# Patient Record
Sex: Female | Born: 1970 | Race: Black or African American | Hispanic: No | Smoking: Never smoker
Health system: Southern US, Community
[De-identification: ages and names within clinical notes are randomized; demographics above are authoritative.]

## PROBLEM LIST (undated history)

## (undated) DIAGNOSIS — E559 Vitamin D deficiency, unspecified: Secondary | ICD-10-CM

## (undated) DIAGNOSIS — K5190 Ulcerative colitis, unspecified, without complications: Secondary | ICD-10-CM

## (undated) DIAGNOSIS — D649 Anemia, unspecified: Secondary | ICD-10-CM

## (undated) DIAGNOSIS — I471 Supraventricular tachycardia, unspecified: Secondary | ICD-10-CM

## (undated) DIAGNOSIS — R7303 Prediabetes: Secondary | ICD-10-CM

## (undated) DIAGNOSIS — D219 Benign neoplasm of connective and other soft tissue, unspecified: Secondary | ICD-10-CM

## (undated) DIAGNOSIS — E785 Hyperlipidemia, unspecified: Secondary | ICD-10-CM

## (undated) DIAGNOSIS — F419 Anxiety disorder, unspecified: Secondary | ICD-10-CM

## (undated) DIAGNOSIS — N920 Excessive and frequent menstruation with regular cycle: Secondary | ICD-10-CM

## (undated) DIAGNOSIS — M1990 Unspecified osteoarthritis, unspecified site: Secondary | ICD-10-CM

## (undated) DIAGNOSIS — I10 Essential (primary) hypertension: Secondary | ICD-10-CM

## (undated) HISTORY — PX: TUBAL LIGATION: SHX77

## (undated) HISTORY — DX: Prediabetes: R73.03

## (undated) HISTORY — DX: Ulcerative colitis, unspecified, without complications: K51.90

## (undated) HISTORY — DX: Vitamin D deficiency, unspecified: E55.9

## (undated) HISTORY — DX: Excessive and frequent menstruation with regular cycle: N92.0

---

## 2014-08-03 ENCOUNTER — Encounter (HOSPITAL_COMMUNITY): Payer: Self-pay

## 2014-08-03 ENCOUNTER — Emergency Department (HOSPITAL_COMMUNITY)
Admission: EM | Admit: 2014-08-03 | Discharge: 2014-08-03 | Disposition: A | Discharge status: Home / Self Care | Payer: Self-pay | Attending: Emergency Medicine | Admitting: Emergency Medicine

## 2014-08-03 ENCOUNTER — Emergency Department (HOSPITAL_COMMUNITY): Payer: Self-pay

## 2014-08-03 DIAGNOSIS — Z79899 Other long term (current) drug therapy: Secondary | ICD-10-CM | POA: Insufficient documentation

## 2014-08-03 DIAGNOSIS — Z7982 Long term (current) use of aspirin: Secondary | ICD-10-CM | POA: Insufficient documentation

## 2014-08-03 DIAGNOSIS — I10 Essential (primary) hypertension: Secondary | ICD-10-CM | POA: Insufficient documentation

## 2014-08-03 DIAGNOSIS — J069 Acute upper respiratory infection, unspecified: Secondary | ICD-10-CM | POA: Insufficient documentation

## 2014-08-03 DIAGNOSIS — E669 Obesity, unspecified: Secondary | ICD-10-CM | POA: Insufficient documentation

## 2014-08-03 HISTORY — DX: Essential (primary) hypertension: I10

## 2014-08-03 LAB — CBC WITH DIFFERENTIAL/PLATELET
Basophils Absolute: 0 10*3/uL (ref 0.0–0.1)
Basophils Relative: 0 % (ref 0–1)
EOS ABS: 0.1 10*3/uL (ref 0.0–0.7)
Eosinophils Relative: 2 % (ref 0–5)
HEMATOCRIT: 30.4 % — AB (ref 36.0–46.0)
HEMOGLOBIN: 9.3 g/dL — AB (ref 12.0–15.0)
Lymphocytes Relative: 31 % (ref 12–46)
Lymphs Abs: 1.3 10*3/uL (ref 0.7–4.0)
MCH: 23.8 pg — ABNORMAL LOW (ref 26.0–34.0)
MCHC: 30.6 g/dL (ref 30.0–36.0)
MCV: 77.9 fL — ABNORMAL LOW (ref 78.0–100.0)
Monocytes Absolute: 0.4 10*3/uL (ref 0.1–1.0)
Monocytes Relative: 10 % (ref 3–12)
NEUTROS ABS: 2.5 10*3/uL (ref 1.7–7.7)
Neutrophils Relative %: 57 % (ref 43–77)
Platelets: ADEQUATE 10*3/uL (ref 150–400)
RBC: 3.9 MIL/uL (ref 3.87–5.11)
RDW: 15.7 % — AB (ref 11.5–15.5)
WBC: 4.3 10*3/uL (ref 4.0–10.5)

## 2014-08-03 LAB — I-STAT TROPONIN, ED: TROPONIN I, POC: 0 ng/mL (ref 0.00–0.08)

## 2014-08-03 LAB — BASIC METABOLIC PANEL
Anion gap: 4 — ABNORMAL LOW (ref 5–15)
BUN: 12 mg/dL (ref 6–23)
CHLORIDE: 106 mmol/L (ref 96–112)
CO2: 22 mmol/L (ref 19–32)
CREATININE: 0.76 mg/dL (ref 0.50–1.10)
Calcium: 8.8 mg/dL (ref 8.4–10.5)
GFR calc Af Amer: >90 mL/min (ref >90)
GFR calc non Af Amer: >90 mL/min (ref >90)
Glucose, Bld: 103 mg/dL — ABNORMAL HIGH (ref 70–99)
Potassium: 3.7 mmol/L (ref 3.5–5.1)
SODIUM: 132 mmol/L — AB (ref 135–145)

## 2014-08-03 MED ORDER — DEXTROMETHORPHAN POLISTIREX 30 MG/5ML PO LQCR: 30.0000 mg | ORAL | Status: DC | PRN

## 2014-08-03 MED ORDER — ALBUTEROL SULFATE HFA 108 (90 BASE) MCG/ACT IN AERS
2.0000 | INHALATION_SPRAY | RESPIRATORY_TRACT | Status: DC | PRN
  Administered 2014-08-03: 2 via RESPIRATORY_TRACT
  Filled 2014-08-03: qty 6.7

## 2014-08-03 NOTE — ED Provider Notes (Signed)
CSN: [id number]     Arrival date & time 08/03/14  0623 History   First MD Initiated Contact with Patient 08/03/14 1002     Chief Complaint  Patient presents with  . Shortness of Breath  . Cough     (Consider location/radiation/quality/duration/timing/severity/associated sxs/prior Treatment) HPI Comments: Patient presents emergency department with chief complaint of cough and shortness of breath. She states that the cough started last night. She also reports some associated shortness breath. She describes this as not being able to catch her breath. She states that she had some chest tightness last night, but this is resolved. She reports some nasal congestion contrary to nursing note, but denies any fevers, chills, productive cough, nausea, vomiting, diarrhea, or constipation. There are no aggravating or alleviating factors. Patient believes her symptoms might be related to allergies. She has a history of hypertension. She is in an area. She has never been seen by cardiologist.  The history is provided by the patient. No language interpreter was used.    Past Medical History  Diagnosis Date  . Hypertension    Past Surgical History  Procedure Laterality Date  . Cesarean section     History reviewed. No pertinent family history. History  Substance Use Topics  . Smoking status: Never Smoker   . Smokeless tobacco: Not on file  . Alcohol Use: Yes     Comment: social   OB History    No data available     Review of Systems  Constitutional: Negative for fever and chills.  Respiratory: Negative for shortness of breath.   Cardiovascular: Negative for chest pain.  Gastrointestinal: Negative for nausea, vomiting, diarrhea and constipation.  Genitourinary: Negative for dysuria.  All other systems reviewed and are negative.     Allergies  Review of patient's allergies indicates no known allergies.  Home Medications   Prior to Admission medications   Medication Sig Start Date End  Date Taking? Authorizing Provider  aspirin 81 MG tablet Take 81 mg by mouth as needed for pain.   Yes Historical Provider, MD  bisoprolol-hydrochlorothiazide (ZIAC) 2.5-6.25 MG per tablet Take 1 tablet by mouth daily.   Yes Historical Provider, MD  Iron TABS Take 1 tablet by mouth daily.   Yes Historical Provider, MD   BP 143/79 mmHg  Pulse 69  Temp(Src) 97.8 F (36.6 C) (Oral)  Resp 16  SpO2 100%  LMP 07/18/2014 Physical Exam  Constitutional: She is oriented to person, place, and time. She appears well-developed and well-nourished.  HENT:  Head: Normocephalic and atraumatic.  Eyes: Conjunctivae and EOM are normal. Pupils are equal, round, and reactive to light.  Neck: Normal range of motion. Neck supple.  Cardiovascular: Normal rate and regular rhythm.  Exam reveals no gallop and no friction rub.   No murmur heard. Pulmonary/Chest: Effort normal and breath sounds normal. No respiratory distress. She has no wheezes. She has no rales. She exhibits no tenderness.  Abdominal: Soft. Bowel sounds are normal. She exhibits no distension and no mass. There is no tenderness. There is no rebound and no guarding.  Musculoskeletal: Normal range of motion. She exhibits no edema or tenderness.  No unilateral leg swelling, or edema  Neurological: She is alert and oriented to person, place, and time.  Skin: Skin is warm and dry.  Psychiatric: She has a normal mood and affect. Her behavior is normal. Judgment and thought content normal.  Nursing note and vitals reviewed.   ED Course  Procedures (including critical care time) Results  for orders placed or performed during the hospital encounter of 08/03/14  CBC with Differential/Platelet  Result Value Ref Range   WBC 4.3 4.0 - 10.5 K/uL   RBC 3.90 3.87 - 5.11 MIL/uL   Hemoglobin 9.3 (L) 12.0 - 15.0 g/dL   HCT 30.4 (L) 36.0 - 46.0 %   MCV 77.9 (L) 78.0 - 100.0 fL   MCH 23.8 (L) 26.0 - 34.0 pg   MCHC 30.6 30.0 - 36.0 g/dL   RDW 15.7 (H) 11.5 -  15.5 %   Platelets  150 - 400 K/uL    PLATELET CLUMPS NOTED ON SMEAR, COUNT APPEARS ADEQUATE   Neutrophils Relative % 57 43 - 77 %   Lymphocytes Relative 31 12 - 46 %   Monocytes Relative 10 3 - 12 %   Eosinophils Relative 2 0 - 5 %   Basophils Relative 0 0 - 1 %   Neutro Abs 2.5 1.7 - 7.7 K/uL   Lymphs Abs 1.3 0.7 - 4.0 K/uL   Monocytes Absolute 0.4 0.1 - 1.0 K/uL   Eosinophils Absolute 0.1 0.0 - 0.7 K/uL   Basophils Absolute 0.0 0.0 - 0.1 K/uL   Smear Review MORPHOLOGY UNREMARKABLE   Basic metabolic panel  Result Value Ref Range   Sodium 132 (L) 135 - 145 mmol/L   Potassium 3.7 3.5 - 5.1 mmol/L   Chloride 106 96 - 112 mmol/L   CO2 22 19 - 32 mmol/L   Glucose, Bld 103 (H) 70 - 99 mg/dL   BUN 12 6 - 23 mg/dL   Creatinine, Ser 0.76 0.50 - 1.10 mg/dL   Calcium 8.8 8.4 - 10.5 mg/dL   GFR calc non Af Amer >90 >90 mL/min   GFR calc Af Amer >90 >90 mL/min   Anion gap 4 (L) 5 - 15  I-Stat Troponin, ED (not at [facility])  Result Value Ref Range   Troponin i, poc 0.00 0.00 - 0.08 ng/mL   Comment 3           Dg Chest 2 View  08/03/2014   CLINICAL DATA:  Cough, shortness of Breath starting last night.  EXAM: CHEST  2 VIEW  COMPARISON:  None.  FINDINGS: The heart size and mediastinal contours are within normal limits. Both lungs are clear. The visualized skeletal structures are unremarkable.  IMPRESSION: No active cardiopulmonary disease.   Electronically Signed   By: Rolm Baptise M.D.   On: 08/03/2014 10:02    ED ECG REPORT  I personally interpreted this EKG   Date: 08/03/2014   Rate: 71  Rhythm: normal sinus rhythm  QRS Axis: normal  Intervals: normal  ST/T Wave abnormalities: normal  Conduction Disutrbances:none  Narrative Interpretation:   Old EKG Reviewed: none available   Imaging Review Dg Chest 2 View  08/03/2014   CLINICAL DATA:  Cough, shortness of Breath starting last night.  EXAM: CHEST  2 VIEW  COMPARISON:  None.  FINDINGS: The heart size and mediastinal contours are within  normal limits. Both lungs are clear. The visualized skeletal structures are unremarkable.  IMPRESSION: No active cardiopulmonary disease.   Electronically Signed   By: Rolm Baptise M.D.   On: 08/03/2014 10:02     EKG Interpretation None      MDM   Final diagnoses:  URI (upper respiratory infection)    Patient with cough and shortness of breath that started last night. She did have some chest tightness that preceded the shortness of breath. Cardiac risk factors include hypertension  and obesity only. No history of PE or DVT, no recent surgery, no hemoptysis, no exogenous estrogen use, no unilateral leg swelling, no recent long travel. PERC negative.  Will check labs, x-ray, and EKG. Will reassess.  Patient reassessed. She is feeling well. Labs are reassuring, however she does have a slight anemia. Will recommend an iron supplement.. EKG is normal. Negative troponin. No chest pain today. Heart score is 1. Patient is stable and ready for discharge. Will give an inhaler and cough medicine. Recommend primary care follow-up. Patient understands and agrees with the plan.     Montine Circle, PA-C 08/03/14 1250  Johnna Acosta, MD 08/03/14 2121

## 2014-08-03 NOTE — ED Notes (Signed)
Pt with cough/shortness of breath starting last night.  States chest pain also yesterday.  No congestion.  No fever.

## 2014-08-03 NOTE — Discharge Instructions (Signed)
Upper Respiratory Infection, Adult An upper respiratory infection (URI) is also sometimes known as the common cold. The upper respiratory tract includes the nose, sinuses, throat, trachea, and bronchi. Bronchi are the airways leading to the lungs. Most people improve within 1 week, but symptoms can last up to 2 weeks. A residual cough may last even longer.  CAUSES Many different viruses can infect the tissues lining the upper respiratory tract. The tissues become irritated and inflamed and often become very moist. Mucus production is also common. A cold is contagious. You can easily spread the virus to others by oral contact. This includes kissing, sharing a glass, coughing, or sneezing. Touching your mouth or nose and then touching a surface, which is then touched by another person, can also spread the virus. SYMPTOMS  Symptoms typically develop 1 to 3 days after you come in contact with a cold virus. Symptoms vary from person to person. They may include:  Runny nose.  Sneezing.  Nasal congestion.  Sinus irritation.  Sore throat.  Loss of voice (laryngitis).  Cough.  Fatigue.  Muscle aches.  Loss of appetite.  Headache.  Low-grade fever. DIAGNOSIS  You might diagnose your own cold based on familiar symptoms, since most people get a cold 2 to 3 times a year. Your caregiver can confirm this based on your exam. Most importantly, your caregiver can check that your symptoms are not due to another disease such as strep throat, sinusitis, pneumonia, asthma, or epiglottitis. Blood tests, throat tests, and X-rays are not necessary to diagnose a common cold, but they may sometimes be helpful in excluding other more serious diseases. Your caregiver will decide if any further tests are required. RISKS AND COMPLICATIONS  You may be at risk for a more severe case of the common cold if you smoke cigarettes, have chronic heart disease (such as heart failure) or lung disease (such as asthma), or if  you have a weakened immune system. The very young and very old are also at risk for more serious infections. Bacterial sinusitis, middle ear infections, and bacterial pneumonia can complicate the common cold. The common cold can worsen asthma and chronic obstructive pulmonary disease (COPD). Sometimes, these complications can require emergency medical care and may be life-threatening. PREVENTION  The best way to protect against getting a cold is to practice good hygiene. Avoid oral or hand contact with people with cold symptoms. Wash your hands often if contact occurs. There is no clear evidence that vitamin C, vitamin E, echinacea, or exercise reduces the chance of developing a cold. However, it is always recommended to get plenty of rest and practice good nutrition. TREATMENT  Treatment is directed at relieving symptoms. There is no cure. Antibiotics are not effective, because the infection is caused by a virus, not by bacteria. Treatment may include:  Increased fluid intake. Sports drinks offer valuable electrolytes, sugars, and fluids.  Breathing heated mist or steam (vaporizer or shower).  Eating chicken soup or other clear broths, and maintaining good nutrition.  Getting plenty of rest.  Using gargles or lozenges for comfort.  Controlling fevers with ibuprofen or acetaminophen as directed by your caregiver.  Increasing usage of your inhaler if you have asthma. Zinc gel and zinc lozenges, taken in the first 24 hours of the common cold, can shorten the duration and lessen the severity of symptoms. Pain medicines may help with fever, muscle aches, and throat pain. A variety of non-prescription medicines are available to treat congestion and runny nose. Your caregiver   can make recommendations and may suggest nasal or lung inhalers for other symptoms.  HOME CARE INSTRUCTIONS   Only take over-the-counter or prescription medicines for pain, discomfort, or fever as directed by your  caregiver.  Use a warm mist humidifier or inhale steam from a shower to increase air moisture. This may keep secretions moist and make it easier to breathe.  Drink enough water and fluids to keep your urine clear or pale yellow.  Rest as needed.  Return to work when your temperature has returned to normal or as your caregiver advises. You may need to stay home longer to avoid infecting others. You can also use a face mask and careful hand washing to prevent spread of the virus. SEEK MEDICAL CARE IF:   After the first few days, you feel you are getting worse rather than better.  You need your caregiver's advice about medicines to control symptoms.  You develop chills, worsening shortness of breath, or brown or red sputum. These may be signs of pneumonia.  You develop yellow or brown nasal discharge or pain in the face, especially when you bend forward. These may be signs of sinusitis.  You develop a fever, swollen neck glands, pain with swallowing, or white areas in the back of your throat. These may be signs of strep throat. SEEK IMMEDIATE MEDICAL CARE IF:   You have a fever.  You develop severe or persistent headache, ear pain, sinus pain, or chest pain.  You develop wheezing, a prolonged cough, cough up blood, or have a change in your usual mucus (if you have chronic lung disease).  You develop sore muscles or a stiff neck. Document Released: 11/13/2000 Document Revised: 08/12/2011 Document Reviewed: 08/25/2013 Exit[employer] Patient Information 2015 [employer]. This information is not intended to replace advice given to you by your health care provider. Make sure you discuss any questions you have with your health care provider.  

## 2014-08-03 NOTE — ED Notes (Signed)
Pt aware awaiting ALL blood results.

## 2014-11-06 ENCOUNTER — Encounter (HOSPITAL_COMMUNITY): Payer: Self-pay | Admitting: Emergency Medicine

## 2014-11-06 ENCOUNTER — Emergency Department (HOSPITAL_COMMUNITY)
Admission: EM | Admit: 2014-11-06 | Discharge: 2014-11-07 | Disposition: A | Discharge status: Home / Self Care | Payer: Self-pay | Attending: Emergency Medicine | Admitting: Emergency Medicine

## 2014-11-06 DIAGNOSIS — I10 Essential (primary) hypertension: Secondary | ICD-10-CM | POA: Insufficient documentation

## 2014-11-06 DIAGNOSIS — Z79899 Other long term (current) drug therapy: Secondary | ICD-10-CM | POA: Insufficient documentation

## 2014-11-06 DIAGNOSIS — S4992XA Unspecified injury of left shoulder and upper arm, initial encounter: Secondary | ICD-10-CM | POA: Insufficient documentation

## 2014-11-06 DIAGNOSIS — Y998 Other external cause status: Secondary | ICD-10-CM | POA: Insufficient documentation

## 2014-11-06 DIAGNOSIS — S161XXA Strain of muscle, fascia and tendon at neck level, initial encounter: Secondary | ICD-10-CM | POA: Insufficient documentation

## 2014-11-06 DIAGNOSIS — Y92410 Unspecified street and highway as the place of occurrence of the external cause: Secondary | ICD-10-CM | POA: Insufficient documentation

## 2014-11-06 DIAGNOSIS — Y9389 Activity, other specified: Secondary | ICD-10-CM | POA: Insufficient documentation

## 2014-11-06 DIAGNOSIS — S4991XA Unspecified injury of right shoulder and upper arm, initial encounter: Secondary | ICD-10-CM | POA: Insufficient documentation
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
## 2014-11-07 MED ORDER — CYCLOBENZAPRINE HCL 10 MG PO TABS
10.0000 mg | ORAL_TABLET | Freq: Once | ORAL | Status: DC
  Filled 2014-11-07: qty 1

## 2014-11-07 MED ORDER — CYCLOBENZAPRINE HCL 10 MG PO TABS: 10.0000 mg | ORAL_TABLET | Freq: Two times a day (BID) | ORAL | Status: DC | PRN

## 2014-11-07 MED ORDER — HYDROCODONE-ACETAMINOPHEN 5-325 MG PO TABS
2.0000 | ORAL_TABLET | Freq: Once | ORAL | Status: AC
  Administered 2014-11-07: 2 via ORAL
  Filled 2014-11-07: qty 2

## 2014-11-07 MED ORDER — HYDROCODONE-ACETAMINOPHEN 5-325 MG PO TABS: 2.0000 | ORAL_TABLET | ORAL | Status: DC | PRN
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
## 2014-11-07 NOTE — Discharge Instructions (Signed)
Take vicodin as needed for pain. Take flexeril as needed for muscle spasm. Refer to attached documents for more information.

## 2014-11-18 ENCOUNTER — Emergency Department (HOSPITAL_COMMUNITY)
Admission: EM | Admit: 2014-11-18 | Discharge: 2014-11-18 | Disposition: A | Discharge status: Home / Self Care | Payer: Self-pay | Attending: Emergency Medicine | Admitting: Emergency Medicine

## 2014-11-18 ENCOUNTER — Encounter (HOSPITAL_COMMUNITY): Payer: Self-pay | Admitting: *Deleted

## 2014-11-18 DIAGNOSIS — I10 Essential (primary) hypertension: Secondary | ICD-10-CM | POA: Insufficient documentation

## 2014-11-18 DIAGNOSIS — Y998 Other external cause status: Secondary | ICD-10-CM | POA: Insufficient documentation

## 2014-11-18 DIAGNOSIS — T7840XA Allergy, unspecified, initial encounter: Secondary | ICD-10-CM | POA: Insufficient documentation

## 2014-11-18 DIAGNOSIS — Z79899 Other long term (current) drug therapy: Secondary | ICD-10-CM | POA: Insufficient documentation

## 2014-11-18 DIAGNOSIS — R21 Rash and other nonspecific skin eruption: Secondary | ICD-10-CM | POA: Insufficient documentation

## 2014-11-18 DIAGNOSIS — Y9389 Activity, other specified: Secondary | ICD-10-CM | POA: Insufficient documentation

## 2014-11-18 DIAGNOSIS — Z7982 Long term (current) use of aspirin: Secondary | ICD-10-CM | POA: Insufficient documentation

## 2014-11-18 DIAGNOSIS — X58XXXA Exposure to other specified factors, initial encounter: Secondary | ICD-10-CM | POA: Insufficient documentation

## 2014-11-18 DIAGNOSIS — Y9289 Other specified places as the place of occurrence of the external cause: Secondary | ICD-10-CM | POA: Insufficient documentation

## 2014-11-18 MED ORDER — DIPHENHYDRAMINE HCL 25 MG PO TABS: 25.0000 mg | ORAL_TABLET | Freq: Four times a day (QID) | ORAL | Status: DC | PRN

## 2014-11-18 NOTE — ED Notes (Signed)
Pt states that she broke out in a rash / hives approx an hour ago; pt states that she also feels like it is harder for her to breathe and swallow; pt able to talk in complete sentences and no changes with voice noted

## 2014-11-18 NOTE — ED Provider Notes (Signed)
CSN: [id number]     Arrival date & time 11/18/14  0140 History   First MD Initiated Contact with Patient 11/18/14 0423     Chief Complaint  Patient presents with  . Allergic Reaction     (Consider location/radiation/quality/duration/timing/severity/associated sxs/prior Treatment) HPI Comments: 44 year old female presents to the emergency department for further evaluation of a rash consistent with urticaria. Patient states that she was showering at approximately midnight when she began to itch all over. Patient reports developing "welts" on her abdomen and extremities. She states that she felt as though her throat was swelling and she was having difficulty swallowing. She denies any drooling, loss of consciousness, or fever. Patient reports a history of similar symptoms when exposed to crab meat. She denies eating any crab meat lately. She states that she has tried to cut back on her soda intake and has begun drinking aloe water over the last 48 hours. She states that her last ingestion of aloe water was at 2000 yesterday evening. Patient denies taking any medications prior to arrival. No medications given in the emergency department on arrival. She reports that her symptoms have subsided. She is no longer feeling as though it is difficult to swallow and her rash has resolved.  Patient is a 44 y.o. female presenting with allergic reaction. The history is provided by the patient. No language interpreter was used.  Allergic Reaction Presenting symptoms: rash     Past Medical History  Diagnosis Date  . Hypertension    Past Surgical History  Procedure Laterality Date  . Cesarean section     Family History  Problem Relation Age of Onset  . Hypertension Mother    History  Substance Use Topics  . Smoking status: Never Smoker   . Smokeless tobacco: Not on file  . Alcohol Use: Yes     Comment: social   OB History    No data available      Review of Systems  Constitutional: Negative  for fever.  Respiratory: Negative for shortness of breath.   Skin: Positive for rash.  Neurological: Negative for syncope.  All other systems reviewed and are negative.   Allergies  Review of patient's allergies indicates no known allergies.  Home Medications   Prior to Admission medications   Medication Sig Start Date End Date Taking? Authorizing Provider  albuterol (PROVENTIL HFA;VENTOLIN HFA) 108 (90 BASE) MCG/ACT inhaler Inhale 2 puffs into the lungs every 6 (six) hours as needed for wheezing or shortness of breath.   Yes Historical Provider, MD  aspirin 81 MG tablet Take 81 mg by mouth as needed for pain.   Yes Historical Provider, MD  bisoprolol-hydrochlorothiazide (ZIAC) 2.5-6.25 MG per tablet Take 1 tablet by mouth daily.   Yes Historical Provider, MD  cyclobenzaprine (FLEXERIL) 10 MG tablet Take 1 tablet (10 mg total) by mouth 2 (two) times daily as needed for muscle spasms. 11/07/14  Yes Alvina Chou, PA-C  HYDROcodone-acetaminophen (NORCO/VICODIN) 5-325 MG per tablet Take 2 tablets by mouth every 4 (four) hours as needed. Patient taking differently: Take 2 tablets by mouth every 4 (four) hours as needed for moderate pain.  11/07/14  Yes Kaitlyn Szekalski, PA-C  dextromethorphan (DELSYM) 30 MG/5ML liquid Take 5 mLs (30 mg total) by mouth as needed for cough. Patient not taking: Reported on 11/06/2014 08/03/14   Montine Circle, PA-C  diphenhydrAMINE (BENADRYL) 25 MG tablet Take 1 tablet (25 mg total) by mouth every 6 (six) hours as needed for itching (Rash). 11/18/14  Antonietta Breach, PA-C   BP 151/84 mmHg  Pulse 80  Temp(Src) 97.8 F (36.6 C) (Oral)  Resp 20  SpO2 100%  LMP 10/16/2014 (Approximate)   Physical Exam  Constitutional: She is oriented to person, place, and time. She appears well-developed and well-nourished. No distress.  HENT:  Head: Normocephalic and atraumatic.  Mouth/Throat: Oropharynx is clear and moist. No oropharyngeal exudate.  Oropharynx clear. No  angioedema. Patient tolerating secretions without difficulty.  Eyes: Conjunctivae and EOM are normal. No scleral icterus.  Neck: Normal range of motion.  Cardiovascular: Normal rate, regular rhythm and intact distal pulses.   Pulmonary/Chest: Effort normal and breath sounds normal. No respiratory distress. She has no wheezes. She has no rales.  Respirations even and unlabored. No stridor.  Musculoskeletal: Normal range of motion.  Neurological: She is alert and oriented to person, place, and time. She exhibits normal muscle tone. Coordination normal.  Skin: Skin is warm and dry. No rash noted. She is not diaphoretic. No erythema. No pallor.  No rash appreciated to trunk or extremities  Psychiatric: She has a normal mood and affect. Her behavior is normal.  Nursing note and vitals reviewed.   ED Course  Procedures (including critical care time) Labs Review Labs Reviewed - No data to display  Imaging Review No results found.   EKG Interpretation None      MDM   Final diagnoses:  Allergic reaction, initial encounter    44 year old female presents to the emergency department for further evaluation of allergic reaction. No medications taken prior to arrival. Patient denies exposure to new soaps/lotions/detergents. No recent antibiotic use. She states that she recently started drinking aloe water. This is new to her daily regimen as she is trying to cut back on her soda intake. Symptoms have resolved spontaneously without intervention. Patient has no angioedema or stridor. No signs of respiratory compromise or hypoxia. No rash appreciated to trunk or extremities.  No indication for further emergent workup at this time. Have discussed supportive treatment as outpatient with the patient should she have recurrence of her symptoms. Primary care follow up advised and return precautions given. Patient agreeable to plan with no unaddressed concerns. Patient discharged in good  condition.   Filed Vitals:   11/18/14 0147  BP: 151/84  Pulse: 80  Temp: 97.8 F (36.6 C)  TempSrc: Oral  Resp: 20  SpO2: 100%       Antonietta Breach, PA-C 11/18/14 1103  Linton Flemings, MD 11/18/14 [phone]

## 2014-11-18 NOTE — Discharge Instructions (Signed)
Hives Hives are itchy, red, swollen areas of the skin. They can vary in size and location on your body. Hives can come and go for hours or several days (acute hives) or for several weeks (chronic hives). Hives do not spread from person to person (noncontagious). They may get worse with scratching, exercise, and emotional stress. CAUSES   Allergic reaction to food, additives, or drugs.  Infections, including the common cold.  Illness, such as vasculitis, lupus, or thyroid disease.  Exposure to sunlight, heat, or cold.  Exercise.  Stress.  Contact with chemicals. SYMPTOMS   Red or white swollen patches on the skin. The patches may change size, shape, and location quickly and repeatedly.  Itching.  Swelling of the hands, feet, and face. This may occur if hives develop deeper in the skin. DIAGNOSIS  Your caregiver can usually tell what is wrong by performing a physical exam. Skin or blood tests may also be done to determine the cause of your hives. In some cases, the cause cannot be determined. TREATMENT  Mild cases usually get better with medicines such as antihistamines. Severe cases may require an emergency epinephrine injection. If the cause of your hives is known, treatment includes avoiding that trigger.  HOME CARE INSTRUCTIONS   Avoid causes that trigger your hives.  Take antihistamines as directed by your caregiver to reduce the severity of your hives. Non-sedating or low-sedating antihistamines are usually recommended. Do not drive while taking an antihistamine.  Take any other medicines prescribed for itching as directed by your caregiver.  Wear loose-fitting clothing.  Keep all follow-up appointments as directed by your caregiver. SEEK MEDICAL CARE IF:   You have persistent or severe itching that is not relieved with medicine.  You have painful or swollen joints. SEEK IMMEDIATE MEDICAL CARE IF:   You have a fever.  Your tongue or lips are swollen.  You have  trouble breathing or swallowing.  You feel tightness in the throat or chest.  You have abdominal pain. These problems may be the first sign of a life-threatening allergic reaction. Call your local emergency services (911 in [place] MAKE SURE YOU:   Understand these instructions.  Will watch your condition.  Will get help right away if you are not doing well or get worse. Document Released: 05/20/2005 Document Revised: 05/25/2013 Document Reviewed: 08/13/2011 Exit[employer] Patient Information 2015 [employer]. This information is not intended to replace advice given to you by your health care provider. Make sure you discuss any questions you have with your health care provider.  

## 2014-11-18 NOTE — ED Notes (Signed)
Patient reports rash that she noticed at midnight has nearly subsided.  States she had a similar reaction years ago when she ate crab meat.  States the only new change in her diet/environment is that she stopped drinking sodas on 6/14 and has been drinking aloe water since that time.

## 2017-01-24 ENCOUNTER — Encounter (HOSPITAL_COMMUNITY): Payer: Self-pay | Admitting: Emergency Medicine

## 2017-01-24 ENCOUNTER — Emergency Department (HOSPITAL_COMMUNITY)
Admission: EM | Admit: 2017-01-24 | Discharge: 2017-01-24 | Disposition: A | Discharge status: Home / Self Care | Payer: Self-pay | Attending: Emergency Medicine | Admitting: Emergency Medicine

## 2017-01-24 DIAGNOSIS — R519 Headache, unspecified: Secondary | ICD-10-CM

## 2017-01-24 DIAGNOSIS — I10 Essential (primary) hypertension: Secondary | ICD-10-CM | POA: Insufficient documentation

## 2017-01-24 DIAGNOSIS — R51 Headache: Secondary | ICD-10-CM

## 2017-01-24 MED ORDER — BISOPROLOL-HYDROCHLOROTHIAZIDE 2.5-6.25 MG PO TABS: 1.0000 | ORAL_TABLET | Freq: Every day | ORAL | 0 refills | Status: DC

## 2017-01-24 MED ORDER — BISOPROLOL-HYDROCHLOROTHIAZIDE 2.5-6.25 MG PO TABS
1.0000 | ORAL_TABLET | Freq: Every day | ORAL | Status: DC
  Filled 2017-01-24: qty 1

## 2017-01-24 MED ORDER — ACETAMINOPHEN 325 MG PO TABS
650.0000 mg | ORAL_TABLET | Freq: Once | ORAL | Status: AC
  Administered 2017-01-24: 650 mg via ORAL
  Filled 2017-01-24: qty 2

## 2017-01-24 NOTE — ED Triage Notes (Signed)
Patient states that she was at work today when she got dizzy. Patient reports that took her BP at work and was 150s/110s. Patient reports PMH HTN and out of her BP medications due to not being able to afford her co-pay for prescription.  Patient also has headache

## 2017-01-24 NOTE — Discharge Instructions (Signed)
Please read attached information. If you experience any new or worsening signs or symptoms please return to the emergency room for evaluation. Please follow-up with your primary care provider or specialist as discussed. Please use medication prescribed only as directed and discontinue taking if you have any concerning signs or symptoms.   °

## 2017-01-24 NOTE — ED Provider Notes (Signed)
[facility] DEPT Provider Note   CSN: [id number] Arrival date & time: 01/24/17  1617     History   Chief Complaint Chief Complaint  Patient presents with  . Dizziness  . Hypertension  . Headache    HPI Haley Gonzales is a 46 y.o. female.  HPI   46 year old female presents today with complaints of headache. Patient notes that  She had a minor headache this morning. She notes this is typical of previous, reports this is a frontal headache. She did not take medication as it was not severe enough. Patient notes that while standing at work she had an episode of dizziness, she reports no associated neurological complaints with this. She reports sitting down which improved her symptoms and have not returned. Patient also notes that she checked her blood pressure work and it was elevated she suspected this as she has not taken her blood pressure medication the last several days. Patient notes she has ran out of her blood pressure medication but was planning on seeing her primary care provider early next week. Patient reports minor headache, no other complaints presently.atient denies any chest pain or  abdominal pain.  Past Medical History:  Diagnosis Date  . Hypertension     There are no active problems to display for this patient.   Past Surgical History:  Procedure Laterality Date  . CESAREAN SECTION      OB History    No data available      Home Medications    Prior to Admission medications   Medication Sig Start Date End Date Taking? Authorizing Provider  diphenhydrAMINE (BENADRYL) 25 MG tablet Take 1 tablet (25 mg total) by mouth every 6 (six) hours as needed for itching (Rash). 11/18/14  Yes Antonietta Breach, PA-C  bisoprolol-hydrochlorothiazide (ZIAC) 2.5-6.25 MG tablet Take 1 tablet by mouth daily. 01/24/17   Nollie Terlizzi, Dellis Filbert, PA-C  cyclobenzaprine (FLEXERIL) 10 MG tablet Take 1 tablet (10 mg total) by mouth 2 (two) times daily as needed for muscle spasms. Patient not  taking: Reported on 01/24/2017 11/07/14   Alvina Chou, PA-C  dextromethorphan (DELSYM) 30 MG/5ML liquid Take 5 mLs (30 mg total) by mouth as needed for cough. Patient not taking: Reported on 11/06/2014 08/03/14   Montine Circle, PA-C  HYDROcodone-acetaminophen (NORCO/VICODIN) 5-325 MG per tablet Take 2 tablets by mouth every 4 (four) hours as needed. Patient not taking: Reported on 01/24/2017 11/07/14   Alvina Chou, PA-C    Family History Family History  Problem Relation Age of Onset  . Hypertension Mother     Social History Social History  Substance Use Topics  . Smoking status: Never Smoker  . Smokeless tobacco: Never Used  . Alcohol use Yes     Comment: social     Allergies   Patient has no known allergies.   Review of Systems Review of Systems  All other systems reviewed and are negative.   Physical Exam Updated Vital Signs BP (!) 153/95 (BP Location: Left Arm)   Pulse 70   Temp 98.7 F (37.1 C) (Oral)   Resp 14   Ht 5\' 2"  (1.575 m)   Wt 78.5 kg (173 lb)   LMP 01/23/2017   SpO2 100%   BMI 31.64 kg/m   Physical Exam  Constitutional: She is oriented to person, place, and time. She appears well-developed and well-nourished.  HENT:  Head: Normocephalic and atraumatic.  Eyes: Pupils are equal, round, and reactive to light. Conjunctivae are normal. Right eye exhibits no discharge. Left eye  exhibits no discharge. No scleral icterus.  Neck: Normal range of motion. No JVD present. No tracheal deviation present.  Pulmonary/Chest: Effort normal. No stridor.  Neurological: She is alert and oriented to person, place, and time. She has normal strength. No cranial nerve deficit or sensory deficit. Coordination normal. GCS eye subscore is 4. GCS verbal subscore is 5. GCS motor subscore is 6.  Psychiatric: She has a normal mood and affect. Her behavior is normal. Judgment and thought content normal.  Nursing note and vitals reviewed.   ED Treatments / Results   Labs (all labs ordered are listed, but only abnormal results are displayed) Labs Reviewed - No data to display  EKG  EKG Interpretation None       Radiology No results found.  Procedures Procedures (including critical care time)  Medications Ordered in ED Medications  acetaminophen (TYLENOL) tablet 650 mg (650 mg Oral Given 01/24/17 2247)     Initial Impression / Assessment and Plan / ED Course  I have reviewed the triage vital signs and the nursing notes.  Pertinent labs & imaging results that were available during my care of the patient were reviewed by me and considered in my medical decision making (see chart for details).     Final Clinical Impressions(s) / ED Diagnoses   Final diagnoses:  Hypertension, unspecified type  Nonintractable headache, unspecified chronicity pattern, unspecified headache type   Labs:    Imaging:  Consults:  Therapeutics:  Discharge Meds:   Assessment/Plan:46 year old female presents today with headache. Patient very minimal symptoms here treated with Tylenol. No red flags. Patient mildly hypertensive here but was secondary to not taking her blood pressure medication. She'll be given a prescription for her blood pressure medication, discharged home with outpatient follow-up. Patient given strict, cautions, she verbalized understanding and agreement to today's plan had no further questions or concerns at time of discharge.    New Prescriptions New Prescriptions   BISOPROLOL-HYDROCHLOROTHIAZIDE (ZIAC) 2.5-6.25 MG TABLET    Take 1 tablet by mouth daily.     Okey Regal, PA-C 01/24/17 2302    Little, Wenda Overland, MD 01/25/17 [phone]

## 2017-05-22 ENCOUNTER — Encounter (HOSPITAL_COMMUNITY): Payer: Self-pay | Admitting: *Deleted

## 2017-05-22 ENCOUNTER — Other Ambulatory Visit: Payer: Self-pay

## 2017-05-22 ENCOUNTER — Emergency Department (HOSPITAL_COMMUNITY)
Admission: EM | Admit: 2017-05-22 | Discharge: 2017-05-22 | Disposition: A | Discharge status: Home / Self Care | Payer: BLUE CROSS/BLUE SHIELD | Attending: Emergency Medicine | Admitting: Emergency Medicine

## 2017-05-22 DIAGNOSIS — D649 Anemia, unspecified: Secondary | ICD-10-CM

## 2017-05-22 DIAGNOSIS — Z79899 Other long term (current) drug therapy: Secondary | ICD-10-CM | POA: Insufficient documentation

## 2017-05-22 DIAGNOSIS — I10 Essential (primary) hypertension: Secondary | ICD-10-CM | POA: Diagnosis not present

## 2017-05-22 DIAGNOSIS — R0602 Shortness of breath: Secondary | ICD-10-CM | POA: Diagnosis present

## 2017-05-22 DIAGNOSIS — I471 Supraventricular tachycardia: Secondary | ICD-10-CM | POA: Diagnosis not present

## 2017-05-22 HISTORY — DX: Supraventricular tachycardia, unspecified: I47.10

## 2017-05-22 HISTORY — DX: Supraventricular tachycardia: I47.1

## 2017-05-22 LAB — I-STAT CHEM 8, ED
BUN: 7 mg/dL (ref 6–20)
CHLORIDE: 106 mmol/L (ref 101–111)
Calcium, Ion: 1.19 mmol/L (ref 1.15–1.40)
Creatinine, Ser: 0.7 mg/dL (ref 0.44–1.00)
Glucose, Bld: 144 mg/dL — ABNORMAL HIGH (ref 65–99)
HCT: 26 % — ABNORMAL LOW (ref 36.0–46.0)
Hemoglobin: 8.8 g/dL — ABNORMAL LOW (ref 12.0–15.0)
Potassium: 3.4 mmol/L — ABNORMAL LOW (ref 3.5–5.1)
SODIUM: 139 mmol/L (ref 135–145)
TCO2: 18 mmol/L — AB (ref 22–32)

## 2017-05-22 MED ORDER — FERROUS SULFATE 325 (65 FE) MG PO TABS: 325.0000 mg | ORAL_TABLET | Freq: Every day | ORAL | 2 refills | Status: DC

## 2017-05-22 MED ORDER — METOPROLOL TARTRATE 25 MG PO TABS: 25.0000 mg | ORAL_TABLET | ORAL | 0 refills | Status: DC | PRN

## 2017-05-22 MED ORDER — BISOPROLOL-HYDROCHLOROTHIAZIDE 2.5-6.25 MG PO TABS: 1.0000 | ORAL_TABLET | Freq: Every day | ORAL | 2 refills | Status: DC

## 2017-05-22 NOTE — Discharge Planning (Signed)
EDCM spoke with pt at bedside regarding PCP accepting new pts.  EDCM advised to call number on back of insurance card and provided PCPs known to be accepting new pts on her AVS.

## 2017-05-22 NOTE — ED Provider Notes (Signed)
[facility] EMERGENCY DEPARTMENT Provider Note   CSN: [id number] Arrival date & time: 05/22/17  3154     History   Chief Complaint Chief Complaint  Patient presents with  . Tachycardia    HPI Haley Gonzales is a 46 y.o. female.  Patient states she awoke this morning approximately 1 hour prior to arrival feeling like she may be having a panic attack.  She states that her heart was racing she felt short of breath and a slight pain over her left chest.  She described as an ache.  She was her normal self yesterday and denies any recent illness.  She takes a combination blood pressure medication but no other medications.  She denies any over-the-counter medications.  She does drink 2 cups of coffee at least daily and then will drink soda in addition.  After treatment by EMS with adenosine she currently has no symptoms.  She denies any chest pain or shortness of breath at this time.   The history is provided by the patient.    Past Medical History:  Diagnosis Date  . Hypertension   . SVT (supraventricular tachycardia) (HCC)     There are no active problems to display for this patient.   Past Surgical History:  Procedure Laterality Date  . CESAREAN SECTION      OB History    No data available       Home Medications    Prior to Admission medications   Medication Sig Start Date End Date Taking? Authorizing Provider  bisoprolol-hydrochlorothiazide (ZIAC) 2.5-6.25 MG tablet Take 1 tablet by mouth daily. 01/24/17   Hedges, Dellis Filbert, PA-C  cyclobenzaprine (FLEXERIL) 10 MG tablet Take 1 tablet (10 mg total) by mouth 2 (two) times daily as needed for muscle spasms. Patient not taking: Reported on 01/24/2017 11/07/14   Alvina Chou, PA-C  dextromethorphan (DELSYM) 30 MG/5ML liquid Take 5 mLs (30 mg total) by mouth as needed for cough. Patient not taking: Reported on 11/06/2014 08/03/14   Montine Circle, PA-C  diphenhydrAMINE (BENADRYL) 25 MG tablet Take 1  tablet (25 mg total) by mouth every 6 (six) hours as needed for itching (Rash). 11/18/14   Antonietta Breach, PA-C  HYDROcodone-acetaminophen (NORCO/VICODIN) 5-325 MG per tablet Take 2 tablets by mouth every 4 (four) hours as needed. Patient not taking: Reported on 01/24/2017 11/07/14   Alvina Chou, PA-C    Family History Family History  Problem Relation Age of Onset  . Hypertension Mother     Social History Social History   Tobacco Use  . Smoking status: Never Smoker  . Smokeless tobacco: Never Used  Substance Use Topics  . Alcohol use: Yes    Comment: social  . Drug use: No     Allergies   Patient has no known allergies.   Review of Systems Review of Systems  All other systems reviewed and are negative.    Physical Exam Updated Vital Signs BP (!) 148/94 (BP Location: Left Arm)   Pulse 99   Temp 98.5 F (36.9 C) (Oral)   Resp 18   Ht 5\' 2"  (1.575 m)   Wt 79.8 kg (176 lb)   LMP 04/16/2017   SpO2 100%   BMI 32.19 kg/m   Physical Exam  Constitutional: She is oriented to person, place, and time. She appears well-developed and well-nourished. No distress.  HENT:  Head: Normocephalic and atraumatic.  Mouth/Throat: Oropharynx is clear and moist.  Eyes: Conjunctivae and EOM are normal. Pupils are equal, round, and  reactive to light.  Neck: Normal range of motion. Neck supple.  Cardiovascular: Normal rate, regular rhythm and intact distal pulses.  No murmur heard. Pulmonary/Chest: Effort normal and breath sounds normal. No respiratory distress. She has no wheezes. She has no rales.  Abdominal: Soft. She exhibits no distension. There is no tenderness. There is no rebound and no guarding.  Musculoskeletal: Normal range of motion. She exhibits no edema or tenderness.  Neurological: She is alert and oriented to person, place, and time.  Skin: Skin is warm and dry. No rash noted. No erythema.  Psychiatric: She has a normal mood and affect. Her behavior is normal.    Nursing note and vitals reviewed.    ED Treatments / Results  Labs (all labs ordered are listed, but only abnormal results are displayed) Labs Reviewed  I-STAT CHEM 8, ED - Abnormal; Notable for the following components:      Result Value   Potassium 3.4 (*)    Glucose, Bld 144 (*)    TCO2 18 (*)    Hemoglobin 8.8 (*)    HCT 26.0 (*)    All other components within normal limits    EKG  EKG Interpretation  Date/Time:  Thursday May 22 2017 08:16:48 EST Ventricular Rate:  97 PR Interval:    QRS Duration: 76 QT Interval:  342 QTC Calculation: 435 R Axis:   34 Text Interpretation:  Sinus rhythm Abnormal R-wave progression, early transition No significant change since last tracing Confirmed by Blanchie Dessert [phone]) on 05/22/2017 8:34:15 AM       Radiology No results found.  Procedures Procedures (including critical care time)  Medications Ordered in ED Medications - No data to display   Initial Impression / Assessment and Plan / ED Course  I have reviewed the triage vital signs and the nursing notes.  Pertinent labs & imaging results that were available during my care of the patient were reviewed by me and considered in my medical decision making (see chart for details).     Patient awoke this morning with palpitations and called EMS.  She was found to be in SVT and was converted after using 12 mg of adenosine in route.  Currently patient is in sinus rhythm and is asymptomatic.  She does drink a fair amount of caffeine daily and takes a combination blood pressure medication but no other medications.  She denies recent over-the-counter medications that may have Sudafed and is otherwise well-appearing.  EKG within normal limits.  Currently denies any chest pain or shortness of breath.  8:43 AM Pt's hb is chronically low which is most likely from heavy menses.  O/w labs without acute findings.  Encouraged pt to f/u with a PCP and given cardiology referral.  Pt  given small ppx for bblocker to take if vagal maneuvers are not successful.  Final Clinical Impressions(s) / ED Diagnoses   Final diagnoses:  SVT (supraventricular tachycardia) (HCC)  Anemia, unspecified type    ED Discharge Orders        Ordered    bisoprolol-hydrochlorothiazide [facility]) 2.5-6.25 MG tablet  Daily     05/22/17 0902    ferrous sulfate 325 (65 FE) MG tablet  Daily     05/22/17 0902    metoprolol tartrate (LOPRESSOR) 25 MG tablet  As needed     05/22/17 8315       Blanchie Dessert, MD 05/22/17 [phone]

## 2017-05-22 NOTE — ED Triage Notes (Signed)
Pt has hx of svt. Reports waking up this am with left side chest pressure. On ems arrival, HR 180-200. Given adenosine 6mg  then 12mg  and pt converted to sinus rhythm. Pt is pain free and  No distress noted on arrival.

## 2017-05-26 ENCOUNTER — Emergency Department (HOSPITAL_COMMUNITY)
Admission: EM | Admit: 2017-05-26 | Discharge: 2017-05-26 | Disposition: A | Discharge status: Home / Self Care | Payer: BLUE CROSS/BLUE SHIELD | Attending: Emergency Medicine | Admitting: Emergency Medicine

## 2017-05-26 ENCOUNTER — Encounter (HOSPITAL_COMMUNITY): Payer: Self-pay | Admitting: Neurology

## 2017-05-26 ENCOUNTER — Emergency Department (HOSPITAL_COMMUNITY): Payer: BLUE CROSS/BLUE SHIELD

## 2017-05-26 DIAGNOSIS — R079 Chest pain, unspecified: Secondary | ICD-10-CM | POA: Diagnosis not present

## 2017-05-26 DIAGNOSIS — Z5321 Procedure and treatment not carried out due to patient leaving prior to being seen by health care provider: Secondary | ICD-10-CM | POA: Insufficient documentation

## 2017-05-26 LAB — BASIC METABOLIC PANEL
Anion gap: 8 (ref 5–15)
BUN: 7 mg/dL (ref 6–20)
CALCIUM: 8.9 mg/dL (ref 8.9–10.3)
CO2: 23 mmol/L (ref 22–32)
CREATININE: 0.8 mg/dL (ref 0.44–1.00)
Chloride: 102 mmol/L (ref 101–111)
GFR calc non Af Amer: >60 mL/min (ref >60)
Glucose, Bld: 120 mg/dL — ABNORMAL HIGH (ref 65–99)
Potassium: 3.4 mmol/L — ABNORMAL LOW (ref 3.5–5.1)
Sodium: 133 mmol/L — ABNORMAL LOW (ref 135–145)

## 2017-05-26 LAB — CBC
HCT: 25.8 % — ABNORMAL LOW (ref 36.0–46.0)
HEMOGLOBIN: 7.1 g/dL — AB (ref 12.0–15.0)
MCH: 18.9 pg — AB (ref 26.0–34.0)
MCHC: 27.5 g/dL — AB (ref 30.0–36.0)
MCV: 68.8 fL — AB (ref 78.0–100.0)
Platelets: 392 10*3/uL (ref 150–400)
RBC: 3.75 MIL/uL — AB (ref 3.87–5.11)
RDW: 18.3 % — ABNORMAL HIGH (ref 11.5–15.5)
WBC: 4.7 10*3/uL (ref 4.0–10.5)

## 2017-05-26 LAB — I-STAT BETA HCG BLOOD, ED (MC, WL, AP ONLY): I-stat hCG, quantitative: <5 m[IU]/mL (ref <5)

## 2017-05-26 LAB — I-STAT TROPONIN, ED: Troponin i, poc: 0 ng/mL (ref 0.00–0.08)

## 2017-05-26 NOTE — ED Triage Notes (Addendum)
Pt reports onset of CP left sided tightness around 0600 today. Has hx of SVT. Reports SOB. Has been feeling dizzy and lightheaded. Is a x 4. EKG SR HR 69

## 2017-05-26 NOTE — ED Notes (Signed)
Pt called for vitals reassessment, no answer  

## 2017-06-17 ENCOUNTER — Other Ambulatory Visit: Payer: Self-pay

## 2017-06-17 ENCOUNTER — Emergency Department (HOSPITAL_COMMUNITY)
Admission: EM | Admit: 2017-06-17 | Discharge: 2017-06-17 | Disposition: A | Discharge status: Home / Self Care | Payer: BLUE CROSS/BLUE SHIELD | Attending: Emergency Medicine | Admitting: Emergency Medicine

## 2017-06-17 ENCOUNTER — Emergency Department (HOSPITAL_COMMUNITY): Payer: BLUE CROSS/BLUE SHIELD

## 2017-06-17 ENCOUNTER — Encounter (HOSPITAL_COMMUNITY): Payer: Self-pay | Admitting: Emergency Medicine

## 2017-06-17 DIAGNOSIS — R0789 Other chest pain: Secondary | ICD-10-CM

## 2017-06-17 DIAGNOSIS — I10 Essential (primary) hypertension: Secondary | ICD-10-CM | POA: Insufficient documentation

## 2017-06-17 DIAGNOSIS — Z79899 Other long term (current) drug therapy: Secondary | ICD-10-CM | POA: Insufficient documentation

## 2017-06-17 DIAGNOSIS — Z8679 Personal history of other diseases of the circulatory system: Secondary | ICD-10-CM | POA: Insufficient documentation

## 2017-06-17 LAB — CBC
HEMATOCRIT: 28.5 % — AB (ref 36.0–46.0)
HEMOGLOBIN: 7.8 g/dL — AB (ref 12.0–15.0)
MCH: 19.5 pg — ABNORMAL LOW (ref 26.0–34.0)
MCHC: 27.4 g/dL — ABNORMAL LOW (ref 30.0–36.0)
MCV: 71.4 fL — AB (ref 78.0–100.0)
Platelets: 431 10*3/uL — ABNORMAL HIGH (ref 150–400)
RBC: 3.99 MIL/uL (ref 3.87–5.11)
RDW: 20.1 % — AB (ref 11.5–15.5)
WBC: 7.6 10*3/uL (ref 4.0–10.5)

## 2017-06-17 LAB — I-STAT BETA HCG BLOOD, ED (MC, WL, AP ONLY)

## 2017-06-17 LAB — BASIC METABOLIC PANEL
ANION GAP: 8 (ref 5–15)
BUN: 10 mg/dL (ref 6–20)
CHLORIDE: 103 mmol/L (ref 101–111)
CO2: 24 mmol/L (ref 22–32)
Calcium: 9.1 mg/dL (ref 8.9–10.3)
Creatinine, Ser: 0.85 mg/dL (ref 0.44–1.00)
Glucose, Bld: 151 mg/dL — ABNORMAL HIGH (ref 65–99)
POTASSIUM: 3.4 mmol/L — AB (ref 3.5–5.1)
SODIUM: 135 mmol/L (ref 135–145)

## 2017-06-17 LAB — I-STAT TROPONIN, ED
Troponin i, poc: 0 ng/mL (ref 0.00–0.08)
Troponin i, poc: 0 ng/mL (ref 0.00–0.08)

## 2017-06-17 MED ORDER — POTASSIUM CHLORIDE CRYS ER 10 MEQ PO TBCR
10.0000 meq | EXTENDED_RELEASE_TABLET | Freq: Once | ORAL | Status: AC
  Administered 2017-06-17: 10 meq via ORAL
  Filled 2017-06-17: qty 1

## 2017-06-17 NOTE — Discharge Instructions (Signed)
Read instructions below for reasons to return to the Emergency Department. It is recommended that your follow up with your Primary Care Doctor in regards to today's visit. Please also schedule an appointment with cardiology provided.  Follow attached handout. I suspect your symptoms were due to SVT today. Please take Beta Blocker given at prior visit as needed if symptoms reoccur and are not relieved with vagal maneuvers. Please avoid any caffeine or stimulants.   Tests performed today include: An EKG of your heart A chest x-ray Cardiac enzymes - a blood test for heart muscle damage Blood counts and electrolytes Vital signs. See below for your results today.   Chest Pain (Nonspecific)  HOME CARE INSTRUCTIONS  For the next few days, avoid physical activities that bring on chest pain. Continue physical activities as directed.  Do not smoke cigarettes or drink alcohol until your symptoms are gone. If you do smoke, it is time to quit. You may receive instructions and counseling on how to stop smoking. Only take over-the-counter or prescription medicine for pain, discomfort, or fever as directed by your caregiver.  Follow your caregiver's suggestions for further testing if your chest pain does not go away.  Keep any follow-up appointments you made. If you do not go to an appointment, you could develop lasting (chronic) problems with pain. If there is any problem keeping an appointment, you must call to reschedule.  SEEK MEDICAL CARE IF:  You think you are having problems from the medicine you are taking. Read your medicine instructions carefully.  Your chest pain does not go away, even after treatment.  You develop a rash with blisters on your chest.  SEEK IMMEDIATE MEDICAL CARE IF:  You have increased chest pain or pain that spreads to your arm, neck, jaw, back, or belly (abdomen).  You develop shortness of breath, an increasing cough, or you are coughing up blood.  You have severe back or  abdominal pain, feel sick to your stomach (nauseous) or throw up (vomit).  You develop severe weakness, fainting, or chills.  You have an oral temperature above 102 F (38.9 C), not controlled by medicine.  THIS IS AN EMERGENCY. Do not wait to see if the pain will go away. Get medical help at once. Call your local emergency services (911 in [place] Do not drive yourself to the hospital. Additional Information:  Your vital signs today were: BP 134/68    Pulse 62    Temp 98.3 F (36.8 C) (Oral)    Resp 17    LMP 05/26/2017    SpO2 100%  If your blood pressure (BP) was elevated above 135/85 this visit, please have this repeated by your doctor within one month. ---------------

## 2017-06-17 NOTE — ED Provider Notes (Signed)
[facility] EMERGENCY DEPARTMENT Provider Note   CSN: [id number] Arrival date & time: 06/17/17  0316     History   Chief Complaint Chief Complaint  Patient presents with  . Chest Pain    HPI Haley Gonzales is a 47 y.o. female with a history of HTN and SVT who presents to the ED today for chest discomfort. Patient reports waking up out of her sleep around 330am this morning with the sensation of her heart racing. She states this was similar to when she was diagnosed with SVT. This was associated with slight pain over the left chest that she describes as a tugging sensation without radiation to the neck, jaw, back, either shoulders/arm. It is without any associated nausea, emesis, or diaphoresis. She said she did have mild shortness of breath. The pain is non-positional, non-pleuritic and non-exertional. The patient did not take anything for this PTA. She came straight to the ED and by the time she arrived (<10 minutes) all of her symptoms had resolved.  The pain has not reocurred.  She is currently without any chest pain or shortness of breath at this time.  No new medications.  She does admit to drinking approximately 2 cups of coffee daily as well as sodas.  No new supplements or OTC medications containing sudafed etc. She has not followed up cardiologist. Patient is a never smoker. No Fhx of early cardiac events.   HPI  Past Medical History:  Diagnosis Date  . Hypertension   . SVT (supraventricular tachycardia) (HCC)     There are no active problems to display for this patient.   Past Surgical History:  Procedure Laterality Date  . CESAREAN SECTION      OB History    No data available       Home Medications    Prior to Admission medications   Medication Sig Start Date End Date Taking? Authorizing Provider  bisoprolol-hydrochlorothiazide (ZIAC) 2.5-6.25 MG tablet Take 1 tablet by mouth daily. 05/22/17   Blanchie Dessert, MD  ferrous sulfate 325 (65  FE) MG tablet Take 1 tablet (325 mg total) by mouth daily. 05/22/17   Blanchie Dessert, MD  metoprolol tartrate (LOPRESSOR) 25 MG tablet Take 1 tablet (25 mg total) by mouth as needed (if you get palpitations and will not resolve with vagal maneuvers). 05/22/17   Blanchie Dessert, MD    Family History Family History  Problem Relation Age of Onset  . Hypertension Mother     Social History Social History   Tobacco Use  . Smoking status: Never Smoker  . Smokeless tobacco: Never Used  Substance Use Topics  . Alcohol use: Yes    Comment: social  . Drug use: No     Allergies   Patient has no known allergies.   Review of Systems Review of Systems  All other systems reviewed and are negative.    Physical Exam Updated Vital Signs BP 125/80   Pulse 74   Temp 98.3 F (36.8 C) (Oral)   Resp 16   LMP 05/26/2017   SpO2 100%   Physical Exam  Constitutional: She appears well-developed and well-nourished.  HENT:  Head: Normocephalic and atraumatic.  Right Ear: External ear normal.  Left Ear: External ear normal.  Nose: Nose normal.  Mouth/Throat: Uvula is midline, oropharynx is clear and moist and mucous membranes are normal. No tonsillar exudate.  Eyes: Pupils are equal, round, and reactive to light. Right eye exhibits no discharge. Left eye exhibits no discharge.  No scleral icterus.  Neck: Trachea normal. Neck supple. No JVD present. No spinous process tenderness present. Carotid bruit is not present. No neck rigidity. Normal range of motion present.  Cardiovascular: Normal rate, regular rhythm and intact distal pulses.  No murmur heard. Pulses:      Radial pulses are 2+ on the right side, and 2+ on the left side.       Dorsalis pedis pulses are 2+ on the right side, and 2+ on the left side.       Posterior tibial pulses are 2+ on the right side, and 2+ on the left side.  No lower extremity swelling or edema. Calves symmetric in size bilaterally.  Pulmonary/Chest:  Effort normal and breath sounds normal. She has no decreased breath sounds. She has no wheezes. She has no rhonchi. She has no rales. She exhibits no tenderness.  Abdominal: Soft. Bowel sounds are normal. There is no tenderness. There is no rebound and no guarding.  Musculoskeletal: She exhibits no edema.  Lymphadenopathy:    She has no cervical adenopathy.  Neurological: She is alert.  Speech clear. Follows commands. No facial droop. PERRLA. EOM grossly intact. CN III-XII grossly intact. Grossly moves all extremities 4 without ataxia. Able and appropriate strength for age to upper and lower extremities bilaterally including grip strength.   Skin: Skin is warm and dry. No rash noted. She is not diaphoretic.  Psychiatric: She has a normal mood and affect.  Nursing note and vitals reviewed.    ED Treatments / Results  Labs (all labs ordered are listed, but only abnormal results are displayed) Labs Reviewed  BASIC METABOLIC PANEL - Abnormal; Notable for the following components:      Result Value   Potassium 3.4 (*)    Glucose, Bld 151 (*)    All other components within normal limits  CBC - Abnormal; Notable for the following components:   Hemoglobin 7.8 (*)    HCT 28.5 (*)    MCV 71.4 (*)    MCH 19.5 (*)    MCHC 27.4 (*)    RDW 20.1 (*)    Platelets 431 (*)    All other components within normal limits  I-STAT TROPONIN, ED  I-STAT BETA HCG BLOOD, ED ([facility], [facility], [facility] ONLY)  I-STAT TROPONIN, ED    EKG  EKG Interpretation  Date/Time:  Tuesday June 17 2017 03:20:11 EST Ventricular Rate:  87 PR Interval:  130 QRS Duration: 70 QT Interval:  352 QTC Calculation: 423 R Axis:   55 Text Interpretation:  Normal sinus rhythm Normal ECG When compared with ECG of 05/26/2017, No significant change was found Confirmed by Delora Fuel ([id number]) on 06/17/2017 3:30:58 AM Also confirmed by Delora Fuel ([id number]), editor Hattie Perch [phone])  on 06/17/2017 6:47:28 AM       Radiology Dg  Chest 2 View  Result Date: 06/17/2017 CLINICAL DATA:  47 year old female with chest pain. EXAM: CHEST  2 VIEW COMPARISON:  Chest radiograph dated 05/26/2017 FINDINGS: The heart size and mediastinal contours are within normal limits. Both lungs are clear. The visualized skeletal structures are unremarkable. IMPRESSION: No active cardiopulmonary disease. Electronically Signed   By: Anner Crete M.D.   On: 06/17/2017 04:39    Procedures Procedures (including critical care time)  Medications Ordered in ED Medications  potassium chloride (K-DUR,KLOR-CON) CR tablet 10 mEq (not administered)     Initial Impression / Assessment and Plan / ED Course  I have reviewed the triage vital signs and the nursing  notes.  Pertinent labs & imaging results that were available during my care of the patient were reviewed by me and considered in my medical decision making (see chart for details).     47 year old female presenting after waking around 330 this morning with palpitations, left sided chest discomfort and some shortness of breath lasting ~10 minutes. Patient states this feels similar to previous SVT. Patient has been symptom free since arriving in the ED. Suspect symptoms likely related to SVT episode. Do not suspect ACS at this time. Patient has stable vital signs, no tracheal deviation, no JVD or new murmur, RRR, breath sounds equal bilaterally, EKG without acute abnormalities, negative troponin x 2, and negative CXR. HEART score is 2. Patient labs are noted to have mild hypokalemia (3.4) that was replenished orally. Hgb low but stable/improved from prior. Patient was given BB prn as needed for symptoms She has not been avoiding stimulants. Consuled on this. Will have patient follow up with cardiologist due to probably recurrent SVT. Return precautions discussed. Patient in agreement with plan and appears safe for discharge.   Final Clinical Impressions(s) / ED Diagnoses   Final diagnoses:    Atypical chest pain    ED Discharge Orders    None       Lorelle Gibbs 06/17/17 1583    Daleen Bo, MD 06/17/17 [phone]

## 2017-06-17 NOTE — ED Triage Notes (Signed)
Pt arrives reporting chest pain that woke her from sleep, states its a tugging sensation. Also reporting shortness of breath.

## 2017-07-14 ENCOUNTER — Ambulatory Visit (INDEPENDENT_AMBULATORY_CARE_PROVIDER_SITE_OTHER): Payer: Self-pay | Admitting: Cardiovascular Disease

## 2017-07-14 ENCOUNTER — Encounter: Payer: Self-pay | Admitting: Cardiovascular Disease

## 2017-07-14 VITALS — BP 142/82 | HR 77 | Ht 62.0 in | Wt 171.6 lb

## 2017-07-14 DIAGNOSIS — I471 Supraventricular tachycardia: Secondary | ICD-10-CM

## 2017-07-14 DIAGNOSIS — Z13220 Encounter for screening for lipoid disorders: Secondary | ICD-10-CM

## 2017-07-14 DIAGNOSIS — Z79899 Other long term (current) drug therapy: Secondary | ICD-10-CM

## 2017-07-14 DIAGNOSIS — I10 Essential (primary) hypertension: Secondary | ICD-10-CM

## 2017-07-14 DIAGNOSIS — R0789 Other chest pain: Secondary | ICD-10-CM

## 2017-07-14 MED ORDER — METOPROLOL SUCCINATE ER 50 MG PO TB24: 50.0000 mg | ORAL_TABLET | Freq: Every day | ORAL | 3 refills | Status: DC

## 2017-07-14 NOTE — Progress Notes (Signed)
Cardiology Office Note    Date:  07/21/2017   ID:  Airis Barbee, DOB Apr 21, 1971, MRN [id number]  PCP:  Patient, No Pcp Per  Cardiologist:  Shelva Majestic, MD   No chief complaint on file.  Cardiology consultation, referred through the courtesy of Dr. Daleen Bo from [facility] emergency room for evaluation of recent SVT and chest pain.  History of Present Illness:  Mionna Advincula is a 47 y.o. female who has a history of hypertension for least 6-7 years, as well as asthma.  She has been on bisoprolol HCT 2.5/6.25 mg daily.  On 05/22/2017 after drinking coffee and woke with her heart racing associated with shortness of breath and vague chest discomfort.  [facility] and was seen by Dr. Maryan Rued.  She was felt to have SVT and was treated with adenosine 12 mg with restoration of sinus rhythm.  She was advised against caffeine use.  On 06/17/2017, she again presented to the emergency room after she was awakened out of sleep at around 3:30 AM with a sensation of her heart racing.  This was associated with slight pain over the left chest that she described as a tugging sensation without radiation to her neck, jaw, back or shoulders.  She presented to the emergency room and within 10 minutes.  Her symptoms had completely resolved.  At that time, she get admitted to drinking 2 cups of coffee per day as well as sodas.  She denied any over-the-counter supplements and had not had any Sudafed use.  Labs were notable for potassium of 3.4.  Her ECG showed sinus rhythm.  She is now referred for cardiology evaluation.  She denies any exertional chest pain.  He denies PND, orthopnea.  She is not routinely exercise.  She presents for evaluation.   Past Medical History:  Diagnosis Date  . Hypertension   . SVT (supraventricular tachycardia) (HCC)     Past Surgical History:  Procedure Laterality Date  . CESAREAN SECTION      Current Medications: Outpatient Medications Prior  to Visit  Medication Sig Dispense Refill  . Multiple Vitamin (MULTIVITAMIN) tablet Take 1 tablet by mouth daily.    . bisoprolol-hydrochlorothiazide (ZIAC) 2.5-6.25 MG tablet Take 1 tablet by mouth daily. 30 tablet 2  . ferrous sulfate 325 (65 FE) MG tablet Take 1 tablet (325 mg total) by mouth daily. 30 tablet 2  . metoprolol tartrate (LOPRESSOR) 25 MG tablet Take 1 tablet (25 mg total) by mouth as needed (if you get palpitations and will not resolve with vagal maneuvers). (Patient not taking: Reported on 07/14/2017) 15 tablet 0   No facility-administered medications prior to visit.      Allergies:   Patient has no known allergies.   Social History   Socioeconomic History  . Marital status: Married    Spouse name: None  . Number of children: None  . Years of education: None  . Highest education level: None  Social Needs  . Financial resource strain: None  . Food insecurity - worry: None  . Food insecurity - inability: None  . Transportation needs - medical: None  . Transportation needs - non-medical: None  Occupational History  . None  Tobacco Use  . Smoking status: Never Smoker  . Smokeless tobacco: Never Used  Substance and Sexual Activity  . Alcohol use: Yes    Comment: social  . Drug use: No  . Sexual activity: None  Other Topics Concern  . None  Social History  Narrative  . None     Family History:  The patient's family history includes Hypertension in her mother.  Father died with lung cancer.  She has 4 living brothers and one deceased brother who had stomach cancer.  She has 3 living sisters.  She has 3 children ages 68, 85, and 86.  ROS General: Negative; No fevers, chills, or night sweats;  HEENT: Negative; No changes in vision or hearing, sinus congestion, difficulty swallowing Pulmonary: Negative; No cough, wheezing, shortness of breath, hemoptysis Cardiovascular: see HPI GI: Negative; No nausea, vomiting, diarrhea, or abdominal pain GU: Negative; No  dysuria, hematuria, or difficulty voiding Musculoskeletal: Negative; no myalgias, joint pain, or weakness Hematologic/Oncology: Negative; no easy bruising, bleeding Endocrine: Negative; no heat/cold intolerance; no diabetes Neuro: Negative; no changes in balance, headaches Skin: Negative; No rashes or skin lesions Psychiatric: Negative; No behavioral problems, depression Sleep: Negative; No snoring, daytime sleepiness, hypersomnolence, bruxism, restless legs, hypnogognic hallucinations, no cataplexy Other comprehensive 14 point system review is negative.   PHYSICAL EXAM:   VS:  BP (!) 142/82   Pulse 77   Ht 5\' 2"  (1.575 m)   Wt 171 lb 9.6 oz (77.8 kg)   BMI 31.39 kg/m    Wt Readings from Last 3 Encounters:  07/14/17 171 lb 9.6 oz (77.8 kg)  05/26/17 176 lb (79.8 kg)  05/22/17 176 lb (79.8 kg)    General: Alert, oriented, no distress.  Skin: normal turgor, no rashes, warm and dry HEENT: Normocephalic, atraumatic. Pupils equal round and reactive to light; sclera anicteric; extraocular muscles intact;  Nose without nasal septal hypertrophy Mouth/Parynx benign; Mallinpatti scale 3 Neck: No JVD, no carotid bruits; normal carotid upstroke Lungs: clear to ausculatation and percussion; no wheezing or rales Chest wall: without tenderness to palpitation Heart: PMI not displaced, RRR, s1 s2 normal, 1/6 systolic murmur, no diastolic murmur, no rubs, gallops, thrills, or heaves Abdomen: soft, nontender; no hepatosplenomehaly, BS+; abdominal aorta nontender and not dilated by palpation. Back: no CVA tenderness Pulses 2+ Musculoskeletal: full range of motion, normal strength, no joint deformities Extremities: no clubbing cyanosis or edema, Homan's sign negative  Neurologic: grossly nonfocal; Cranial nerves grossly wnl Psychologic: Normal mood and affect   Studies/Labs Reviewed:   EKG:  EKG is ordered today. ECG (independently read by me): Normal sinus rhythm at 77 bpm.  No ectopy.  QTc  interval 436 ms.  PR interval 140 ms.  Recent Labs: BMP Latest Ref Rng & Units 06/17/2017 05/26/2017 05/22/2017  Glucose 65 - 99 mg/dL 151(H) 120(H) 144(H)  BUN 6 - 20 mg/dL 10 7 7   Creatinine 0.44 - 1.00 mg/dL 0.85 0.80 0.70  Sodium 135 - 145 mmol/L 135 133(L) 139  Potassium 3.5 - 5.1 mmol/L 3.4(L) 3.4(L) 3.4(L)  Chloride 101 - 111 mmol/L 103 102 106  CO2 22 - 32 mmol/L 24 23 -  Calcium 8.9 - 10.3 mg/dL 9.1 8.9 -     No flowsheet data found.  CBC Latest Ref Rng & Units 06/17/2017 05/26/2017 05/22/2017  WBC 4.0 - 10.5 K/uL 7.6 4.7 -  Hemoglobin 12.0 - 15.0 g/dL 7.8(L) 7.1(L) 8.8(L)  Hematocrit 36.0 - 46.0 % 28.5(L) 25.8(L) 26.0(L)  Platelets 150 - 400 K/uL 431(H) 392 -   Lab Results  Component Value Date   MCV 71.4 (L) 06/17/2017   MCV 68.8 (L) 05/26/2017   MCV 77.9 (L) 08/03/2014   No results found for: TSH No results found for: HGBA1C   BNP No results found for: BNP  ProBNP  No results found for: PROBNP   Lipid Panel  No results found for: CHOL, TRIG, HDL, CHOLHDL, VLDL, LDLCALC, LDLDIRECT   RADIOLOGY: No results found.   Additional studies/ records that were reviewed today include:  I reviewed her 2 recent emergency room evaluations.  ASSESSMENT:    1. Hypertension, unspecified type   2. SVT (supraventricular tachycardia) ([facility])   3. Lipid screening   4. Medication management   5. Atypical chest pain      PLAN:  Ms. Debar Plate is a 54 are old Afro-American female who has a history of mild obesity with a BMI of 31.4, and is recently presented to the emergency room following 2 isolated episodes where she was awakened with rapid heartbeat.  Initially she was felt to have SVT and she converted following administration of adenosine 12 mg intravenously.  On the second evaluation, she was again awakened from sleep and ultimately her symptoms resolve spontaneously.  She has been on very low-dose bisoprolol HCT.  I am recommending that she discontinue this.   In its place I'm giving her a prescription for metoprolol succinate and she will take 25 mg for the first 4 days and then increase this to 50 mg daily.  I am rechecking fasting laboratory including chemistry profile, magnesium level, TSH, CBC, lipid panel.  She is unaware of snoring.  An Epworth Sleepiness Scale score was calculated in the office today and this endorsed at 5 arguing against daytime sleepiness.  However, it is certainly possible she may developing nocturnal hypoxia which may be contributing to her arrhythmia.  We again discussed importance of avoidance of caffeine, both in coffee, soft drinks, and chocolates.  We discussed the importance of avoidance of pseudoephedrine preparations. I will schedule her to undergo a 2-D echo Doppler study.  If she develops recurrent nocturnal arrhythmias on increased beta blocker therapy sleep study may be warranted.  I will see her back in the office in 4-6 weeks for further evaluation.   Medication Adjustments/Labs and Tests Ordered: Current medicines are reviewed at length with the patient today.  Concerns regarding medicines are outlined above.  Medication changes, Labs and Tests ordered today are listed in the Patient Instructions below. Patient Instructions  Medication Instructions:  STOP Bisoprolol/HCTZ  START metoprolol succinate (Toprol XL) --take 1/2 tablet for the first 4 days --then increase to 1 tablet daily  Labwork: Fasting labs (CMET, CBC, TSH, Lipid, Mag)  Testing/Procedures: Your physician has requested that you have an echocardiogram. Echocardiography is a painless test that uses sound waves to create images of your heart. It provides your doctor with information about the size and shape of your heart and how well your heart's chambers and valves are working. This procedure takes approximately one hour. There are no restrictions for this procedure.  This will be done at our [facility] location:  [address]   Follow-Up: 6 weeks with Dr. Claiborne Billings  Any Other Special Instructions Will Be Listed Below (If Applicable).     If you need a refill on your cardiac medications before your next appointment, please call your pharmacy.      Signed, Shelva Majestic, MD  07/21/2017 1:56 PM    [facility] [address] [place], [place], [place]  [address] Phone: [phone]

## 2017-07-14 NOTE — Patient Instructions (Signed)
Medication Instructions:  STOP Bisoprolol/HCTZ  START metoprolol succinate (Toprol XL) --take 1/2 tablet for the first 4 days --then increase to 1 tablet daily  Labwork: Fasting labs (CMET, CBC, TSH, Lipid, Mag)  Testing/Procedures: Your physician has requested that you have an echocardiogram. Echocardiography is a painless test that uses sound waves to create images of your heart. It provides your doctor with information about the size and shape of your heart and how well your heart's chambers and valves are working. This procedure takes approximately one hour. There are no restrictions for this procedure.  This will be done at our [facility] location:  [address]   Follow-Up: 6 weeks with Dr. Claiborne Billings  Any Other Special Instructions Will Be Listed Below (If Applicable).     If you need a refill on your cardiac medications before your next appointment, please call your pharmacy.

## 2017-07-21 ENCOUNTER — Encounter: Payer: Self-pay | Admitting: Cardiovascular Disease

## 2017-07-22 LAB — CBC
Hematocrit: 27.6 % — ABNORMAL LOW (ref 34.0–46.6)
Hemoglobin: 8.2 g/dL — ABNORMAL LOW (ref 11.1–15.9)
MCH: 21.1 pg — ABNORMAL LOW (ref 26.6–33.0)
MCHC: 29.7 g/dL — ABNORMAL LOW (ref 31.5–35.7)
MCV: 71 fL — ABNORMAL LOW (ref 79–97)
PLATELETS: 465 10*3/uL — AB (ref 150–379)
RBC: 3.89 x10E6/uL (ref 3.77–5.28)
RDW: 19.7 % — ABNORMAL HIGH (ref 12.3–15.4)
WBC: 5.2 10*3/uL (ref 3.4–10.8)

## 2017-07-22 LAB — LIPID PANEL
CHOLESTEROL TOTAL: 244 mg/dL — AB (ref 100–199)
Chol/HDL Ratio: 2.5 ratio (ref 0.0–4.4)
HDL: 98 mg/dL (ref >39)
LDL Calculated: 132 mg/dL — ABNORMAL HIGH (ref 0–99)
TRIGLYCERIDES: 69 mg/dL (ref 0–149)
VLDL Cholesterol Cal: 14 mg/dL (ref 5–40)

## 2017-07-22 LAB — COMPREHENSIVE METABOLIC PANEL
ALBUMIN: 4 g/dL (ref 3.5–5.5)
ALK PHOS: 67 IU/L (ref 39–117)
ALT: 9 IU/L (ref 0–32)
AST: 13 IU/L (ref 0–40)
Albumin/Globulin Ratio: 1.3 (ref 1.2–2.2)
BUN/Creatinine Ratio: 10 (ref 9–23)
BUN: 7 mg/dL (ref 6–24)
CHLORIDE: 103 mmol/L (ref 96–106)
CO2: 23 mmol/L (ref 20–29)
CREATININE: 0.72 mg/dL (ref 0.57–1.00)
Calcium: 9.2 mg/dL (ref 8.7–10.2)
GFR calc Af Amer: 116 mL/min/{1.73_m2} (ref >59)
GFR calc non Af Amer: 101 mL/min/{1.73_m2} (ref >59)
Globulin, Total: 3.2 g/dL (ref 1.5–4.5)
Glucose: 97 mg/dL (ref 65–99)
POTASSIUM: 4.2 mmol/L (ref 3.5–5.2)
SODIUM: 137 mmol/L (ref 134–144)
Total Protein: 7.2 g/dL (ref 6.0–8.5)

## 2017-07-22 LAB — TSH: TSH: 1.08 u[IU]/mL (ref 0.450–4.500)

## 2017-07-22 LAB — MAGNESIUM: Magnesium: 1.9 mg/dL (ref 1.6–2.3)

## 2017-07-24 ENCOUNTER — Ambulatory Visit (HOSPITAL_COMMUNITY): Payer: Self-pay | Attending: Cardiology

## 2017-07-24 ENCOUNTER — Other Ambulatory Visit: Payer: Self-pay

## 2017-07-24 DIAGNOSIS — R0789 Other chest pain: Secondary | ICD-10-CM | POA: Insufficient documentation

## 2017-07-24 DIAGNOSIS — R011 Cardiac murmur, unspecified: Secondary | ICD-10-CM | POA: Insufficient documentation

## 2017-07-24 DIAGNOSIS — I10 Essential (primary) hypertension: Secondary | ICD-10-CM

## 2017-07-24 DIAGNOSIS — I471 Supraventricular tachycardia, unspecified: Secondary | ICD-10-CM

## 2017-07-24 DIAGNOSIS — J45909 Unspecified asthma, uncomplicated: Secondary | ICD-10-CM | POA: Insufficient documentation

## 2017-07-30 ENCOUNTER — Telehealth: Payer: Self-pay | Admitting: Cardiovascular Disease

## 2017-07-30 ENCOUNTER — Other Ambulatory Visit: Payer: Self-pay | Admitting: *Deleted

## 2017-07-30 DIAGNOSIS — I471 Supraventricular tachycardia: Secondary | ICD-10-CM

## 2017-07-30 DIAGNOSIS — D649 Anemia, unspecified: Secondary | ICD-10-CM

## 2017-07-30 DIAGNOSIS — R718 Other abnormality of red blood cells: Secondary | ICD-10-CM

## 2017-07-30 NOTE — Telephone Encounter (Signed)
New message ° °Pt returning call for nurse °

## 2017-07-30 NOTE — Telephone Encounter (Signed)
Patient aware of results and recommendations.  Has never had iron studies-orders placed.   Also request information about diet for her cholesterol.   Will print and place with lab orders.    Also aware lipid panel to be repeated in 3 months.    Patient verbalized understanding.

## 2017-08-07 LAB — IRON,TIBC AND FERRITIN PANEL
FERRITIN: 6 ng/mL — AB (ref 15–150)
IRON: 15 ug/dL — AB (ref 27–159)
Iron Saturation: 5 % — CL (ref 15–55)
Total Iron Binding Capacity: 325 ug/dL (ref 250–450)
UIBC: 310 ug/dL (ref 131–425)

## 2017-08-13 ENCOUNTER — Other Ambulatory Visit: Payer: Self-pay | Admitting: *Deleted

## 2017-08-13 DIAGNOSIS — E611 Iron deficiency: Secondary | ICD-10-CM

## 2017-08-13 DIAGNOSIS — D649 Anemia, unspecified: Secondary | ICD-10-CM

## 2017-08-14 ENCOUNTER — Telehealth: Payer: Self-pay | Admitting: Cardiovascular Disease

## 2017-08-14 DIAGNOSIS — D508 Other iron deficiency anemias: Secondary | ICD-10-CM

## 2017-08-14 NOTE — Telephone Encounter (Signed)
Acknowledged; patient was recently found to be iron deficient and was started on iron.  With recent iron deficiency anemia, will start Niferex 150 mg twice a day.  She also should have a GI evaluation

## 2017-08-14 NOTE — Telephone Encounter (Signed)
New message    Patient would not give any information wants to speak to nurse.   Patient is having spotting and she wants to know what she is suppose to do

## 2017-08-14 NOTE — Telephone Encounter (Signed)
Patient said that her menstrual started on March 4-10 and she noticed a little spotting today and said this happened last month also. Patient said she doesn't have a PCP. Patient instructed that she could go to Urgent Care or locate a PCP that can see her for this problem. Patient verbalized understanding.

## 2017-08-18 MED ORDER — POLYSACCHARIDE IRON COMPLEX 150 MG PO CAPS: 150.0000 mg | ORAL_CAPSULE | Freq: Two times a day (BID) | ORAL | 2 refills | Status: DC

## 2017-08-18 NOTE — Telephone Encounter (Signed)
Patient informed and verbalized understanding

## 2017-08-19 ENCOUNTER — Encounter: Payer: Self-pay | Admitting: Gastroenterology

## 2017-09-05 ENCOUNTER — Ambulatory Visit (INDEPENDENT_AMBULATORY_CARE_PROVIDER_SITE_OTHER): Payer: Self-pay | Admitting: Cardiovascular Disease

## 2017-09-05 ENCOUNTER — Encounter: Payer: Self-pay | Admitting: Cardiovascular Disease

## 2017-09-05 VITALS — BP 153/81 | HR 61 | Ht 62.0 in | Wt 172.6 lb

## 2017-09-05 DIAGNOSIS — I471 Supraventricular tachycardia: Secondary | ICD-10-CM

## 2017-09-05 DIAGNOSIS — D509 Iron deficiency anemia, unspecified: Secondary | ICD-10-CM

## 2017-09-05 DIAGNOSIS — R718 Other abnormality of red blood cells: Secondary | ICD-10-CM

## 2017-09-05 DIAGNOSIS — Z79899 Other long term (current) drug therapy: Secondary | ICD-10-CM

## 2017-09-05 DIAGNOSIS — E7800 Pure hypercholesterolemia, unspecified: Secondary | ICD-10-CM

## 2017-09-05 DIAGNOSIS — I10 Essential (primary) hypertension: Secondary | ICD-10-CM

## 2017-09-05 LAB — CBC
HEMATOCRIT: 29.8 % — AB (ref 34.0–46.6)
HEMOGLOBIN: 8.9 g/dL — AB (ref 11.1–15.9)
MCH: 22.3 pg — ABNORMAL LOW (ref 26.6–33.0)
MCHC: 29.9 g/dL — ABNORMAL LOW (ref 31.5–35.7)
MCV: 75 fL — ABNORMAL LOW (ref 79–97)
Platelets: 391 10*3/uL — ABNORMAL HIGH (ref 150–379)
RBC: 4 x10E6/uL (ref 3.77–5.28)
RDW: 19.3 % — AB (ref 12.3–15.4)
WBC: 4.7 10*3/uL (ref 3.4–10.8)

## 2017-09-05 LAB — IRON,TIBC AND FERRITIN PANEL
Ferritin: 6 ng/mL — ABNORMAL LOW (ref 15–150)
Iron Saturation: 6 % — CL (ref 15–55)
Iron: 19 ug/dL — ABNORMAL LOW (ref 27–159)
Total Iron Binding Capacity: 332 ug/dL (ref 250–450)
UIBC: 313 ug/dL (ref 131–425)

## 2017-09-05 MED ORDER — AMLODIPINE BESYLATE 5 MG PO TABS: 5.0000 mg | ORAL_TABLET | Freq: Every day | ORAL | 3 refills | Status: DC

## 2017-09-05 NOTE — Patient Instructions (Addendum)
Medication Instructions:  START amlodipine 5 mg daily  Labwork: Today-(Iron studies, CBC)  Please return for FASTING labs in 6 months (CMET, CBC, Lipid)-lab orders will be mailed to you.  Our in office lab hours are Monday-Friday 8:00-4:00, closed for lunch 12:45-1:45 pm.  No appointment needed.  Follow-Up: Keep follow up with GI doctor 5/1  Your physician wants you to follow-up in: 6 months with Dr. Claiborne Billings. You will receive a reminder letter in the mail two months in advance. If you don't receive a letter, please call our office to schedule the follow-up appointment.   If you need a refill on your cardiac medications before your next appointment, please call your pharmacy.

## 2017-09-05 NOTE — Progress Notes (Signed)
Cardiology Office Note    Date:  09/05/2017   ID:  Haley Gonzales, DOB 1970/12/24, MRN [id number]  PCP:  Patient, No Pcp Per  Cardiologist:  Shelva Majestic, MD   Chief Complaint  Patient presents with  . Follow-up   F/U cardiology evaluation initiallyreferred through the courtesy of Dr. Daleen Bo from [facility] emergency room for evaluation of recent SVT and chest pain.  History of Present Illness:  Haley Gonzales is a 47 y.o. female who has a history of hypertension , as well as asthma.  She has been on bisoprolol HCT 2.5/6.25 mg daily.  On 05/22/2017 after drinking coffee and woke with her heart racing associated with shortness of breath and vague chest discomfort.  She presented to [facility] emergency room and was seen by Dr. Maryan Rued.  She was felt to have SVT and was treated with adenosine 12 mg with restoration of sinus rhythm.  She was advised against caffeine use.  On 06/17/2017, she again presented to the emergency room after she was awakened out of sleep at around 3:30 AM with a sensation of her heart racing.  This was associated with slight pain over the left chest that she described as a tugging sensation without radiation to her neck, jaw, back or shoulders.  She presented to the emergency room and within 10 minutes.  Her symptoms had completely resolved.  At that time, she  admitted to drinking 2 cups of coffee per day as well as sodas.  She denied any over-the-counter supplements and had not had any Sudafed use.  Labs were notable for potassium of 3.4.  Her ECG showed sinus rhythm.  I saw her for initial evaluation July 14, 2017.  She had been on very low-dose bisoprolol HCT I recommended discontinuance of this and started her on Toprol-XL 25 mg for 4 days and then 50 mg daily.  On her long-acting beta-blocker regimen at 50 mg, she has been without recurrent palpitations.  She has felt improved.  She underwent an echo Doppler study on July 24, 2017 which showed an EF of  60-65%.  She had normal diastolic parameters there was no significant valvular pathology.  Laboratory was notable for microcytic anemia with an MCV of 71, hemoglobin 8.2 and hematocrit 27.6.  I had recommended stool guaiacs as well as iron studies and a GI evaluation.  Iron studies were notable in that she was significantly iron deficient with a iron saturation of less than 5% and a ferritin level of 6.  She never followed through with stool guaiac.  She is scheduled to see Dr. Owens Loffler several weeks for GI evaluation.  Upon further questioning today she admits that she eats a significant amount of ice on a daily basis has been doing this for years.  She denies any black stools.  She was started on Niferex 150 mg twice a day has been on this therapy for approximately 1 month.  She denies any exertional chest pain, ENT orthopnea.  She presents for reevaluation.  Past Medical History:  Diagnosis Date  . Hypertension   . SVT (supraventricular tachycardia) (HCC)     Past Surgical History:  Procedure Laterality Date  . CESAREAN SECTION      Current Medications: Outpatient Medications Prior to Visit  Medication Sig Dispense Refill  . iron polysaccharides (NIFEREX) 150 MG capsule Take 1 capsule (150 mg total) by mouth 2 (two) times daily. 60 capsule 2  . metoprolol succinate (TOPROL-XL) 50 MG 24 hr tablet Take 1 tablet (  50 mg total) by mouth daily. Take with or immediately following a meal. 90 tablet 3  . Multiple Vitamin (MULTIVITAMIN) tablet Take 1 tablet by mouth daily.     No facility-administered medications prior to visit.      Allergies:   Patient has no known allergies.   Social History   Socioeconomic History  . Marital status: Married    Spouse name: Not on file  . Number of children: Not on file  . Years of education: Not on file  . Highest education level: Not on file  Occupational History  . Not on file  Social Needs  . Financial resource strain: Not on file  . Food  insecurity:    Worry: Not on file    Inability: Not on file  . Transportation needs:    Medical: Not on file    Non-medical: Not on file  Tobacco Use  . Smoking status: Never Smoker  . Smokeless tobacco: Never Used  Substance and Sexual Activity  . Alcohol use: Yes    Comment: social  . Drug use: No  . Sexual activity: Not on file  Lifestyle  . Physical activity:    Days per week: Not on file    Minutes per session: Not on file  . Stress: Not on file  Relationships  . Social connections:    Talks on phone: Not on file    Gets together: Not on file    Attends religious service: Not on file    Active member of club or organization: Not on file    Attends meetings of clubs or organizations: Not on file    Relationship status: Not on file  Other Topics Concern  . Not on file  Social History Narrative  . Not on file     Family History:  The patient's family history includes Hypertension in her mother.  Father died with lung cancer.  She has 4 living brothers and one deceased brother who had stomach cancer.  She has 3 living sisters.  She has 3 children ages 34, 27, and 41.  ROS General: Negative; No fevers, chills, or night sweats;  HEENT: Negative; No changes in vision or hearing, sinus congestion, difficulty swallowing Pulmonary: Negative; No cough, wheezing, shortness of breath, hemoptysis Cardiovascular: see HPI GI: Negative; No nausea, vomiting, diarrhea, or abdominal pain GU: Negative; No dysuria, hematuria, or difficulty voiding Musculoskeletal: Negative; no myalgias, joint pain, or weakness Hematologic/Oncology: Negative; no easy bruising, bleeding Endocrine: Negative; no heat/cold intolerance; no diabetes Neuro: Negative; no changes in balance, headaches Skin: Negative; No rashes or skin lesions Psychiatric: Negative; No behavioral problems, depression Sleep: Negative; No snoring, daytime sleepiness, hypersomnolence, bruxism, restless legs, hypnogognic  hallucinations, no cataplexy Other comprehensive 14 point system review is negative.   PHYSICAL EXAM:   VS:  BP (!) 153/81   Pulse 61   Ht _0  (1.575 m)   Wt 172 lb 9.6 oz (78.3 kg)   BMI 31.57 kg/m     Repeat blood pressure by me was 152/82  Wt Readings from Last 3 Encounters:  09/05/17 172 lb 9.6 oz (78.3 kg)  07/14/17 171 lb 9.6 oz (77.8 kg)  05/26/17 176 lb (79.8 kg)    General: Alert, oriented, no distress.  Skin: normal turgor, no rashes, warm and dry HEENT: Normocephalic, atraumatic. Pupils equal round and reactive to light; sclera anicteric; extraocular muscles intact;  Nose without nasal septal hypertrophy Mouth/Parynx benign; Mallinpatti scale 3 Neck: No JVD, no carotid bruits; normal  carotid upstroke Lungs: clear to ausculatation and percussion; no wheezing or rales Chest wall: without tenderness to palpitation Heart: PMI not displaced, RRR, s1 s2 normal, 1/6 systolic murmur, no diastolic murmur, no rubs, gallops, thrills, or heaves Abdomen: soft, nontender; no hepatosplenomehaly, BS+; abdominal aorta nontender and not dilated by palpation. Back: no CVA tenderness Pulses 2+ Musculoskeletal: full range of motion, normal strength, no joint deformities Extremities: no clubbing cyanosis or edema, Homan's sign negative  Neurologic: grossly nonfocal; Cranial nerves grossly wnl Psychologic: Normal mood and affect   Studies/Labs Reviewed:   EKG:  EKG is ordered today.  Sinus bradycardia at 57 bpm.  No ectopy.  Normal intervals.  ECG (independently read by me): Normal sinus rhythm at 77 bpm.  No ectopy.  QTc interval 436 ms.  PR interval 140 ms.  Recent Labs: BMP Latest Ref Rng & Units 07/22/2017 06/17/2017 05/26/2017  Glucose 65 - 99 mg/dL 97 151(H) 120(H)  BUN 6 - 24 mg/dL _0 Creatinine 0.57 - 1.00 mg/dL 0.72 0.85 0.80  BUN/Creat Ratio 9 - 23 10 - -  Sodium 134 - 144 mmol/L 137 135 133(L)  Potassium 3.5 - 5.2 mmol/L 4.2 3.4(L) 3.4(L)  Chloride 96 - 106  mmol/L 103 103 102  CO2 20 - 29 mmol/L _1 Calcium 8.7 - 10.2 mg/dL 9.2 9.1 8.9     Hepatic Function Latest Ref Rng & Units 07/22/2017  Total Protein 6.0 - 8.5 g/dL 7.2  Albumin 3.5 - 5.5 g/dL 4.0  AST 0 - 40 IU/L 13  ALT 0 - 32 IU/L 9  Alk Phosphatase 39 - 117 IU/L 67  Total Bilirubin 0.0 - 1.2 mg/dL <0.2    CBC Latest Ref Rng & Units 07/22/2017 06/17/2017 05/26/2017  WBC 3.4 - 10.8 x10E3/uL 5.2 7.6 4.7  Hemoglobin 11.1 - 15.9 g/dL 8.2(L) 7.8(L) 7.1(L)  Hematocrit 34.0 - 46.6 % 27.6(L) 28.5(L) 25.8(L)  Platelets 150 - 379 x10E3/uL 465(H) 431(H) 392   Lab Results  Component Value Date   MCV 71 (L) 07/22/2017   MCV 71.4 (L) 06/17/2017   MCV 68.8 (L) 05/26/2017   Lab Results  Component Value Date   TSH 1.080 07/22/2017   No results found for: HGBA1C   BNP No results found for: BNP  ProBNP No results found for: PROBNP   Lipid Panel     Component Value Date/Time   CHOL 244 (H) 07/22/2017 0949   TRIG 69 07/22/2017 0949   HDL 98 07/22/2017 0949   CHOLHDL 2.5 07/22/2017 0949   LDLCALC 132 (H) 07/22/2017 0949     RADIOLOGY: No results found.   Additional studies/ records that were reviewed today include:  I reviewed her 2 recent emergency room evaluations.  ASSESSMENT:    1. SVT (supraventricular tachycardia) ([facility])   2. Essential hypertension   3. Low mean corpuscular volume (MCV)   4. Iron deficiency anemia, unspecified iron deficiency anemia type   5. Medication management   6. Pure hypercholesterolemia      PLAN:  Ms. Haley Gonzales is a 42 are old African American female who has a history of mild obesity with a BMI of 31.4, and is earlier this year presented to the emergency room following 2 isolated episodes where she was awakened with rapid heartbeat.  Initially she was felt to have SVT and she converted following administration of adenosine 12 mg intravenously.  On the second evaluation, she was again awakened from sleep and ultimately her  symptoms resolve spontaneously.  She had been on very low-dose bisoprolol HCT.  I initially saw her, I recommended she discontinue the bisoprolol HCT and in its place started long-acting metoprolol succinate 25 mg for 4 days and then 50 mg daily.  Since initiating this therapy, she has had complete resolution of awareness of spells of increased heart rate.  Of note, as part of her evaluation she was found to have significant microcytic anemia with an MCV of 71.  Iron studies revealed less than 5% iron saturation and a ferritin level of 6.  She is now been on Niferex 150 mg twice a day.  I had recommended stool guaiacs but she never had this done.  She is unaware of black stools.  Upon further questioning she admits to long-standing daily eating of ice.  I had recommended GI evaluation and appointment is scheduled for Dr. Ardis Hughs on Oct 01, 2017.  Presently, she feels well on Toprol-XL 50 mg.  Her echo Doppler study was essentially normal and showed normal systolic and diastolic parameters and was without significant valvular pathology.  I will recheck iron studies today as well as a follow-up CBC.  Mg level was normal at 1.9.  Blood pressure today is elevated at 152/82.  Her resting pulse is bradycardic.  For this reason I will add amlodipine 5 mg to her medical regimen rather than further titrating the beta-blocker or initiating Cardizem.  Laboratory also disclosed hyperlipidemia.  I have recommended adherence to a strict diet with plans to recheck her levels in several months.  I will see her in 6 months for reevaluation.  Medication Adjustments/Labs and Tests Ordered: Current medicines are reviewed at length with the patient today.  Concerns regarding medicines are outlined above.  Medication changes, Labs and Tests ordered today are listed in the Patient Instructions below. Patient Instructions  Medication Instructions:  START amlodipine 5 mg daily  Labwork: Today-(Iron studies, CBC)  Please return for  FASTING labs in 6 months (CMET, CBC, Lipid)-lab orders will be mailed to you.  Our in office lab hours are Monday-Friday 8:00-4:00, closed for lunch 12:45-1:45 pm.  No appointment needed.  Follow-Up: Keep follow up with GI doctor 5/1  Your physician wants you to follow-up in: 6 months with Dr. Claiborne Billings. You will receive a reminder letter in the mail two months in advance. If you don't receive a letter, please call our office to schedule the follow-up appointment.   If you need a refill on your cardiac medications before your next appointment, please call your pharmacy.      Signed, Shelva Majestic, MD  09/05/2017 12:40 PM    [facility] [address], [place], [place], [place]  [address] Phone: [phone]

## 2017-10-01 ENCOUNTER — Other Ambulatory Visit: Payer: Self-pay

## 2017-10-01 ENCOUNTER — Encounter: Payer: Self-pay | Admitting: Gastroenterology

## 2017-10-01 ENCOUNTER — Emergency Department (HOSPITAL_COMMUNITY)
Admission: EM | Admit: 2017-10-01 | Discharge: 2017-10-01 | Disposition: A | Discharge status: Home / Self Care | Payer: Self-pay | Attending: Emergency Medicine | Admitting: Emergency Medicine

## 2017-10-01 ENCOUNTER — Emergency Department (HOSPITAL_COMMUNITY): Payer: Self-pay

## 2017-10-01 ENCOUNTER — Ambulatory Visit: Payer: Self-pay | Admitting: Gastroenterology

## 2017-10-01 DIAGNOSIS — R0789 Other chest pain: Secondary | ICD-10-CM | POA: Insufficient documentation

## 2017-10-01 DIAGNOSIS — I10 Essential (primary) hypertension: Secondary | ICD-10-CM | POA: Insufficient documentation

## 2017-10-01 DIAGNOSIS — Z79899 Other long term (current) drug therapy: Secondary | ICD-10-CM | POA: Insufficient documentation

## 2017-10-01 DIAGNOSIS — D509 Iron deficiency anemia, unspecified: Secondary | ICD-10-CM | POA: Insufficient documentation

## 2017-10-01 LAB — CBC
HCT: 32.1 % — ABNORMAL LOW (ref 36.0–46.0)
Hemoglobin: 9.3 g/dL — ABNORMAL LOW (ref 12.0–15.0)
MCH: 22.7 pg — AB (ref 26.0–34.0)
MCHC: 29 g/dL — ABNORMAL LOW (ref 30.0–36.0)
MCV: 78.5 fL (ref 78.0–100.0)
Platelets: 437 10*3/uL — ABNORMAL HIGH (ref 150–400)
RBC: 4.09 MIL/uL (ref 3.87–5.11)
RDW: 18.6 % — ABNORMAL HIGH (ref 11.5–15.5)
WBC: 6.2 10*3/uL (ref 4.0–10.5)

## 2017-10-01 LAB — BASIC METABOLIC PANEL
Anion gap: 7 (ref 5–15)
BUN: 7 mg/dL (ref 6–20)
CALCIUM: 9 mg/dL (ref 8.9–10.3)
CO2: 24 mmol/L (ref 22–32)
CREATININE: 0.8 mg/dL (ref 0.44–1.00)
Chloride: 104 mmol/L (ref 101–111)
GFR calc Af Amer: >60 mL/min (ref >60)
GFR calc non Af Amer: >60 mL/min (ref >60)
GLUCOSE: 120 mg/dL — AB (ref 65–99)
Potassium: 3.6 mmol/L (ref 3.5–5.1)
Sodium: 135 mmol/L (ref 135–145)

## 2017-10-01 LAB — I-STAT TROPONIN, ED: TROPONIN I, POC: 0 ng/mL (ref 0.00–0.08)

## 2017-10-01 LAB — I-STAT BETA HCG BLOOD, ED (MC, WL, AP ONLY): I-stat hCG, quantitative: <5 m[IU]/mL (ref <5)

## 2017-10-01 MED ORDER — PANTOPRAZOLE SODIUM 40 MG PO TBEC
40.0000 mg | DELAYED_RELEASE_TABLET | Freq: Once | ORAL | Status: AC
Start: 2017-10-01 — End: 2017-10-01
  Administered 2017-10-01: 40 mg via ORAL
  Filled 2017-10-01: qty 1

## 2017-10-01 NOTE — ED Provider Notes (Signed)
[facility] EMERGENCY DEPARTMENT Provider Note   CSN: [id number] Arrival date & time: 10/01/17  6283     History   Chief Complaint Chief Complaint  Patient presents with  . Chest Pain    HPI Haley Gonzales is a 47 y.o. female.  The history is provided by the patient.  She has history of SVT and hyperlipidemia and comes in with intermittent chest pains over the last 3 days.  She describes a tight feeling in her chest which would last a few seconds to up to a minute.  It seems to be more prevalent when she is laying down, and seems to get better when she sits up and drink some water.  There is no associated dyspnea, nausea, diaphoresis.  She has had similar episodes in the past, and these episodes are less severe than what she has had in the past.  Denies any exertional symptoms.  Of note, she does have history of iron deficiency anemia and has been on iron therapy and has noted an improvement in exercise tolerance.  She is a non-smoker and denies history of hypertension or diabetes.  There is no family history of premature coronary atherosclerosis.  Past Medical History:  Diagnosis Date  . Hypertension   . SVT (supraventricular tachycardia) (HCC)     There are no active problems to display for this patient.   Past Surgical History:  Procedure Laterality Date  . CESAREAN SECTION       OB History   None      Home Medications    Prior to Admission medications   Medication Sig Start Date End Date Taking? Authorizing Provider  amLODipine (NORVASC) 5 MG tablet Take 1 tablet (5 mg total) by mouth daily. 09/05/17 12/04/17  Troy Sine, MD  iron polysaccharides (NIFEREX) 150 MG capsule Take 1 capsule (150 mg total) by mouth 2 (two) times daily. 08/18/17   Troy Sine, MD  metoprolol succinate (TOPROL-XL) 50 MG 24 hr tablet Take 1 tablet (50 mg total) by mouth daily. Take with or immediately following a meal. 07/14/17 10/12/17  Troy Sine, MD  Multiple  Vitamin (MULTIVITAMIN) tablet Take 1 tablet by mouth daily.    [provider]    Family History Family History  Problem Relation Age of Onset  . Hypertension Mother     Social History Social History   Tobacco Use  . Smoking status: Never Smoker  . Smokeless tobacco: Never Used  Substance Use Topics  . Alcohol use: Yes    Comment: social  . Drug use: No     Allergies   Patient has no known allergies.   Review of Systems Review of Systems  All other systems reviewed and are negative.    Physical Exam Updated Vital Signs BP (!) 143/86 (BP Location: Right Arm)   Pulse 70   Temp 98.5 F (36.9 C) (Oral)   Resp 20   Ht 5\' 2"  (1.575 m)   Wt 78 kg (172 lb)   SpO2 100%   BMI 31.46 kg/m   Physical Exam  Nursing note and vitals reviewed.  47 year old female, resting comfortably and in no acute distress. Vital signs are significant for borderline elevated blood pressure. Oxygen saturation is 100%, which is normal. Head is normocephalic and atraumatic. PERRLA, EOMI. Oropharynx is clear. Neck is nontender and supple without adenopathy or JVD. Back is nontender and there is no CVA tenderness. Lungs are clear without rales, wheezes, or rhonchi. Chest  is nontender. Heart has regular rate and rhythm without murmur. Abdomen is soft, flat, nontender without masses or hepatosplenomegaly and peristalsis is normoactive. Extremities have no cyanosis or edema, full range of motion is present. Skin is warm and dry without rash. Neurologic: Mental status is normal, cranial nerves are intact, there are no motor or sensory deficits.  ED Treatments / Results  Labs (all labs ordered are listed, but only abnormal results are displayed) Labs Reviewed  BASIC METABOLIC PANEL - Abnormal; Notable for the following components:      Result Value   Glucose, Bld 120 (*)    All other components within normal limits  CBC - Abnormal; Notable for the following components:    Hemoglobin 9.3 (*)    HCT 32.1 (*)    MCH 22.7 (*)    MCHC 29.0 (*)    RDW 18.6 (*)    Platelets 437 (*)    All other components within normal limits  I-STAT TROPONIN, ED  I-STAT BETA HCG BLOOD, ED ([facility], [facility], [facility] ONLY)    EKG EKG Interpretation  Date/Time:  Wednesday Oct 01 2017 03:30:02 EDT Ventricular Rate:  79 PR Interval:  146 QRS Duration: 68 QT Interval:  380 QTC Calculation: 435 R Axis:   27 Text Interpretation:  Normal sinus rhythm Cannot rule out Anterior infarct , age undetermined Abnormal ECG When compared with ECG of 06/17/2017, No significant change was found Confirmed by Delora Fuel ([id number]) on 10/01/2017 5:10:22 AM   Radiology Dg Chest 2 View  Result Date: 10/01/2017 CLINICAL DATA:  Chest pain EXAM: CHEST - 2 VIEW COMPARISON:  06/17/2017 FINDINGS: The heart size and mediastinal contours are within normal limits. Both lungs are clear. The visualized skeletal structures are unremarkable. IMPRESSION: No active cardiopulmonary disease. Electronically Signed   By: Donavan Foil M.D.   On: 10/01/2017 03:50    Procedures Procedures   Medications Ordered in ED Medications  pantoprazole (PROTONIX) EC tablet 40 mg (has no administration in time range)     Initial Impression / Assessment and Plan / ED Course  I have reviewed the triage vital signs and the nursing notes.  Pertinent labs & imaging results that were available during my care of the patient were reviewed by me and considered in my medical decision making (see chart for details).  Chest pain which is somewhat atypical.  Pattern is strongly suggestive of GERD.  I have low index of suspicion for ACS, pulmonary embolism.  Chest x-ray shows no evidence of pneumonia.  Old records are reviewed and she has had evaluation by cardiology who do note chest discomfort but I have not felt she needed stress testing.  Echocardiogram had been done which was normal.  She is discharged with instructions to take over-the-counter  omeprazole for 2 weeks.  If symptoms resolve, no further treatment needed.  If symptoms go away but recur with stopping omeprazole, then she is to go back to taking it daily.  Laboratory work-up today does show anemia, but it has improved significantly over the last 3 months.  In December, hemoglobin was 7.1, today it is 9.3.  This is the same level it had been in 2016.  She is encouraged to continue taking her iron supplements.  Return precautions discussed.  Heart score is 2, which puts her at low risk for major adverse cardiac events in the next 30 days.  Final Clinical Impressions(s) / ED Diagnoses   Final diagnoses:  Atypical chest pain  Iron deficiency anemia, unspecified iron  deficiency anemia type    ED Discharge Orders    None       Delora Fuel, MD 85/88/50 [phone]

## 2017-10-01 NOTE — ED Triage Notes (Signed)
Pt reports episodic chest pain lasting 2-3 seconds, occurring every 5-6 minutes for 2 days. Pt reports minimal associated SOB w/ episodes. States pain does not radiate, does not occur w/ exertion. Pt denies hx of similar episodes of pain.

## 2017-10-01 NOTE — Discharge Instructions (Addendum)
Take omeprazole (Prilosec OTC) once a day for the next two weeks.   If your pain goes away, and comes back when you stop taking the omeprazole, then resume taking it.

## 2017-12-15 ENCOUNTER — Other Ambulatory Visit: Payer: Self-pay | Admitting: Cardiovascular Disease

## 2017-12-15 MED ORDER — AMLODIPINE BESYLATE 5 MG PO TABS: 5.0000 mg | ORAL_TABLET | Freq: Every day | ORAL | 0 refills | Status: DC

## 2017-12-15 NOTE — Telephone Encounter (Signed)
Rx(s) sent to pharmacy electronically.  

## 2017-12-15 NOTE — Telephone Encounter (Signed)
New Message       *STAT* If patient is at the pharmacy, call can be transferred to refill team.   1. Which medications need to be refilled? (please list name of each medication and dose if known) Amlodipine  2. Which pharmacy/location (including street and city if local pharmacy) is medication to be sent to?[facility]  Garald Braver. Do they need a 30 day or 90 day supply?[place]

## 2017-12-25 ENCOUNTER — Emergency Department (HOSPITAL_COMMUNITY)
Admission: EM | Admit: 2017-12-25 | Discharge: 2017-12-25 | Disposition: A | Discharge status: Home / Self Care | Payer: Self-pay | Attending: Emergency Medicine | Admitting: Emergency Medicine

## 2017-12-25 ENCOUNTER — Encounter (HOSPITAL_COMMUNITY): Payer: Self-pay | Admitting: Emergency Medicine

## 2017-12-25 ENCOUNTER — Emergency Department (HOSPITAL_COMMUNITY): Payer: Self-pay

## 2017-12-25 DIAGNOSIS — I10 Essential (primary) hypertension: Secondary | ICD-10-CM | POA: Insufficient documentation

## 2017-12-25 DIAGNOSIS — R0789 Other chest pain: Secondary | ICD-10-CM | POA: Insufficient documentation

## 2017-12-25 LAB — I-STAT TROPONIN, ED: Troponin i, poc: 0 ng/mL (ref 0.00–0.08)

## 2017-12-25 LAB — MAGNESIUM: Magnesium: 1.9 mg/dL (ref 1.7–2.4)

## 2017-12-25 LAB — CBC
HCT: 34.2 % — ABNORMAL LOW (ref 36.0–46.0)
Hemoglobin: 10.3 g/dL — ABNORMAL LOW (ref 12.0–15.0)
MCH: 25.6 pg — ABNORMAL LOW (ref 26.0–34.0)
MCHC: 30.1 g/dL (ref 30.0–36.0)
MCV: 84.9 fL (ref 78.0–100.0)
PLATELETS: 335 10*3/uL (ref 150–400)
RBC: 4.03 MIL/uL (ref 3.87–5.11)
RDW: 15.4 % (ref 11.5–15.5)
WBC: 5.4 10*3/uL (ref 4.0–10.5)

## 2017-12-25 LAB — BASIC METABOLIC PANEL
Anion gap: 10 (ref 5–15)
BUN: 9 mg/dL (ref 6–20)
CHLORIDE: 105 mmol/L (ref 98–111)
CO2: 23 mmol/L (ref 22–32)
CREATININE: 0.84 mg/dL (ref 0.44–1.00)
Calcium: 9.1 mg/dL (ref 8.9–10.3)
GFR calc Af Amer: >60 mL/min (ref >60)
GFR calc non Af Amer: >60 mL/min (ref >60)
Glucose, Bld: 114 mg/dL — ABNORMAL HIGH (ref 70–99)
Potassium: 4 mmol/L (ref 3.5–5.1)
SODIUM: 138 mmol/L (ref 135–145)

## 2017-12-25 LAB — I-STAT BETA HCG BLOOD, ED (MC, WL, AP ONLY)

## 2017-12-25 NOTE — Discharge Instructions (Addendum)
You were seen in the ER for pain in your chest.   Your lab work, EKG, chest x-ray, heart enzymes were normal.  The cause of your pain is unclear, but work up today in ER looks good.  Consider muscular injury from lifting or muscular soreness from recent coughing.   Return to ER for cheat pain or shortness of breath with exertion or activity, light-headedness, nausea, vomiting, palpitations, cough, fever, calf pain or swelling.

## 2017-12-25 NOTE — ED Notes (Signed)
Patient transported to X-ray 

## 2017-12-25 NOTE — ED Provider Notes (Signed)
[facility] Provider Note   CSN: [id number] Arrival date & time: 12/25/17  3785     History   Chief Complaint Chief Complaint  Patient presents with  . Chest Pain    HPI Haley Gonzales is a 47 y.o. female history of SVT, hypertension, asthma is here for evaluation of chest pain.  Onset 2 hours PTA, woke her up from sleep.  Described as a tugging sensation to the left aspect of her chest, nonradiating, intermittent, lasting a few seconds randomly without provoking actions.  Reports pain feels similar to last she is discharged with a few time she came to the ER for chest pain and when she was diagnosed with SVT 6 months ago.  Had a slight cough last week that is improving.  Compliant with metoprolol and amlodipine.  She finished iron pills prescribed by cardiology.  Denies associated headache, vision changes, nausea, vomiting, lightheadedness, shortness of breath, palpitations, diaphoresis.  No alcohol use.  No longer caffeine intake.  Occasional soda intake, last 3 days ago.  No illicit drug use.  No BP meds on board today yet. Seen in ER on 5/1, 1/15 for similar.   HPI  Past Medical History:  Diagnosis Date  . Hypertension   . SVT (supraventricular tachycardia) (HCC)     There are no active problems to display for this patient.   Past Surgical History:  Procedure Laterality Date  . CESAREAN SECTION       OB History   None      Home Medications    Prior to Admission medications   Medication Sig Start Date End Date Taking? Authorizing Provider  amLODipine (NORVASC) 5 MG tablet Take 1 tablet (5 mg total) by mouth daily. 12/15/17 03/15/18 Yes Troy Sine, MD  metoprolol succinate (TOPROL-XL) 50 MG 24 hr tablet Take 1 tablet (50 mg total) by mouth daily. Take with or immediately following a meal. 07/14/17 12/25/24 Yes Troy Sine, MD  iron polysaccharides (NIFEREX) 150 MG capsule Take 1 capsule (150 mg total) by mouth 2 (two)  times daily. Patient not taking: Reported on 12/25/2017 08/18/17   Troy Sine, MD    Family History Family History  Problem Relation Age of Onset  . Hypertension Mother     Social History Social History   Tobacco Use  . Smoking status: Never Smoker  . Smokeless tobacco: Never Used  Substance Use Topics  . Alcohol use: Yes    Comment: social  . Drug use: No     Allergies   Patient has no known allergies.   Review of Systems Review of Systems  Cardiovascular: Positive for chest pain.  All other systems reviewed and are negative.    Physical Exam Updated Vital Signs BP (!) 150/93   Pulse 65   Temp 98.6 F (37 C) (Oral)   Resp 17   LMP 11/10/2017 (Approximate)   SpO2 100%   Physical Exam  Constitutional: She appears well-developed and well-nourished.  NAD. Non toxic.   HENT:  Head: Normocephalic and atraumatic.  Nose: Nose normal.  Eyes: Conjunctivae, EOM and lids are normal.  Neck: Trachea normal and normal range of motion.  Trachea midline. No cervical adenopathy  Cardiovascular: Normal rate, regular rhythm, S1 normal, S2 normal and normal heart sounds.  Pulses:      Carotid pulses are 2+ on the right side, and 2+ on the left side.      Radial pulses are 2+ on the right side,  and 2+ on the left side.       Dorsalis pedis pulses are 2+ on the right side, and 2+ on the left side.  No anterior/posterior thorax tenderness. No pain with deep inspiration. No pain with active ROM of upper extremities or sitting up on bed. RRR. No LE edema or calf tenderness. HR mid 70s. SNR on cardiac monitor.   Pulmonary/Chest: Effort normal and breath sounds normal.  Abdominal: Soft. Bowel sounds are normal. There is no tenderness.  No epigastric tenderness. NTND  Neurological: She is alert. GCS eye subscore is 4. GCS verbal subscore is 5. GCS motor subscore is 6.  Skin: Skin is warm and dry. Capillary refill takes less than 2 seconds.  No rash to chest wall  Psychiatric:  She has a normal mood and affect. Her speech is normal and behavior is normal. Judgment and thought content normal. Cognition and memory are normal.     ED Treatments / Results  Labs (all labs ordered are listed, but only abnormal results are displayed) Labs Reviewed  BASIC METABOLIC PANEL - Abnormal; Notable for the following components:      Result Value   Glucose, Bld 114 (*)    All other components within normal limits  CBC - Abnormal; Notable for the following components:   Hemoglobin 10.3 (*)    HCT 34.2 (*)    MCH 25.6 (*)    All other components within normal limits  MAGNESIUM  I-STAT TROPONIN, ED  I-STAT BETA HCG BLOOD, ED ([facility], [facility], [facility] ONLY)    EKG EKG Interpretation  Date/Time:  Thursday December 25 2017 05:29:32 EDT Ventricular Rate:  88 PR Interval:  140 QRS Duration: 72 QT Interval:  366 QTC Calculation: 442 R Axis:   16 Text Interpretation:  Normal sinus rhythm Normal ECG When compared with ECG of 10/01/2017, No significant change was found Confirmed by Delora Fuel ([id number]) on 12/25/2017 5:35:25 AM   Radiology Dg Chest 2 View  Result Date: 12/25/2017 CLINICAL DATA:  Left-sided chest pain tonight. Rapid heart rate a couple of weeks ago. EXAM: CHEST - 2 VIEW COMPARISON:  10/01/2017 FINDINGS: The heart size and mediastinal contours are within normal limits. Both lungs are clear. The visualized skeletal structures are unremarkable. IMPRESSION: No active cardiopulmonary disease. Electronically Signed   By: Lucienne Capers M.D.   On: 12/25/2017 06:04    Procedures Procedures (including critical care time)  Medications Ordered in ED Medications - No data to display   Initial Impression / Assessment and Plan / ED Course  I have reviewed the triage vital signs and the nursing notes.  Pertinent labs & imaging results that were available during my care of the patient were reviewed by me and considered in my medical decision making (see chart for details).  Clinical  Course as of Dec 26 727  Thu Dec 25, 2017  0614 Hemoglobin(!): 10.3 [CG]    Clinical Course User Index [CG] Kinnie Feil, PA-C   47 y.o. female presents with CP atypical in description.  Ddx includes ACS, MSK, costochondritis, PNA, PE.  Symptoms have been intermittent, at rest as well.  Pertinent cardiac risk factors include  HTN, borderline hypercholesterolemia, obesity.  Heart score < 3.  On exam VS are wnl. Hypertensive in ER but has not taken BP meds this morning, otherwise cardiovascular and pulmonary exam benign. CXR, EKG, troponin x 1 within normal limits.  NSR on cardiac monitor. CBC, BMP, Mg unremarkable.  Low suspicion for PE given normal HR,  RR and SpO2. No risk factors for PE. No LE swelling or calf tenderness.  No pleuritic CP.  Doubt dissection given no widening of mediastinum, currently pain free, normalized BP after re-check, symmetric distal pulses.  Final Clinical Impressions(s) / ED Diagnoses   Reviewed cardiology visit in April 2019.  Normal echo study. She has been compliant with prescribed metoprolol and amlodipine for diagnosed SVT 6 months ago.  Found to be anemia, finished niferex. Hemoglobin improved.  Denies melena, hematochezia, no h/o of same. She missed GI appointment with Dr Ardis Hughs and rescheduled. Offered hemoccult exam in ER but declined.  Considered safe for discharge at this time.  Cause of atypical CP unclear. She had a cough so possibly costochondritis. Lifts a lot at work as well.  She is not describing GERD type symptoms with it.  Encouraged f/u with cardiology for recurrent symptoms.  GI f/u encouraged for work of anemia.    Chart and available pertinent old records, if available, reviewed by me. Imaging and labs in ER viewed and interpreted by me and used in the medical decision making with formal interpretation from radiologist. Discharge home in stable condition, return precautions discussed.  Patient agreeable with plan for discharge home.  Final  diagnoses:  Atypical chest pain    ED Discharge Orders    None       Arlean Hopping [birth year] [id number]    Delora Fuel, MD 66[birth year]/30 [phone]

## 2017-12-25 NOTE — ED Triage Notes (Signed)
Pt c/o left sided chest pain that awoke her from sleep, reports hx of the same a few months ago when she had a rapid heart rate. Pt in nad, resp e/u, HR 94 in triage.

## 2017-12-25 NOTE — ED Notes (Signed)
Patient verbalizes understanding of medications and discharge instructions. No further questions at this time. VSS and patient ambulatory at discharge.   

## 2018-01-09 ENCOUNTER — Encounter: Payer: Self-pay | Admitting: Physician Assistant

## 2018-01-09 ENCOUNTER — Ambulatory Visit (INDEPENDENT_AMBULATORY_CARE_PROVIDER_SITE_OTHER): Payer: Self-pay | Admitting: Physician Assistant

## 2018-01-09 VITALS — BP 136/88 | HR 62 | Ht 62.0 in | Wt 178.6 lb

## 2018-01-09 DIAGNOSIS — F5089 Other specified eating disorder: Secondary | ICD-10-CM

## 2018-01-09 DIAGNOSIS — I471 Supraventricular tachycardia: Secondary | ICD-10-CM

## 2018-01-09 DIAGNOSIS — R0789 Other chest pain: Secondary | ICD-10-CM

## 2018-01-09 DIAGNOSIS — Z79899 Other long term (current) drug therapy: Secondary | ICD-10-CM

## 2018-01-09 DIAGNOSIS — D509 Iron deficiency anemia, unspecified: Secondary | ICD-10-CM

## 2018-01-09 DIAGNOSIS — I10 Essential (primary) hypertension: Secondary | ICD-10-CM

## 2018-01-09 DIAGNOSIS — E785 Hyperlipidemia, unspecified: Secondary | ICD-10-CM

## 2018-01-09 MED ORDER — POLYSACCHARIDE IRON COMPLEX 150 MG PO CAPS: 150.0000 mg | ORAL_CAPSULE | Freq: Two times a day (BID) | ORAL | 0 refills | Status: DC

## 2018-01-09 NOTE — Patient Instructions (Signed)
Medication Instructions:  NO CHANGES- Your physician recommends that you continue on your current medications as directed. Please refer to the Current Medication list given to you today.  If you need a refill on your cardiac medications before your next appointment, please call your pharmacy.  Labwork: FLP IN 2 MONTHS(OCT) HERE IN OUR OFFICE AT [facility]  Take the provided lab slips with you to the lab for your blood draw.   You will need to fast. DO NOT EAT OR DRINK PAST MIDNIGHT.   Testing/Procedures: Your physician has requested that you have an exercise tolerance test, this is a screening tool to track your fitness level. This test evaluates the your exercise capacity by measuring cardiovascular response to exercise, the stress response is induced by exercise (exercise-treadmill).  Graded exercise test is also known as maximal exercise test or stress EKG test  . Please also follow instruction sheet given. HOLD METOPROLOL THE DAY BEFORE AND RESTART AFTER TEST.  Special Instructions: MAKE SURE TO FOLLOW LOW CHOLESTEROL DIET AND EXERCISE TO IMPROVE YOUR CHOLESTEROL  Follow-Up: Your physician wants you to follow-up in: [place].   Thank you for choosing [facility] at [facility]!!      Fat and Cholesterol Restricted Diet Getting too much fat and cholesterol in your diet may cause health problems. Following this diet helps keep your fat and cholesterol at normal levels. This can keep you from getting sick. What types of fat should I choose?  Choose monosaturated and polyunsaturated fats. These are found in foods such as olive oil, canola oil, flaxseeds, walnuts, almonds, and seeds.  Eat more omega-3 fats. Good choices include salmon, mackerel, sardines, tuna, flaxseed oil, and ground flaxseeds.  Limit saturated fats. These are in animal products such as meats, butter, and cream. They can also be in plant products such as palm oil, palm kernel oil, and  coconut oil.  Avoid foods with partially hydrogenated oils in them. These contain trans fats. Examples of foods that have trans fats are stick margarine, some tub margarines, cookies, crackers, and other baked goods. What general guidelines do I need to follow?  Check food labels. Look for the words "trans fat" and "saturated fat."  When preparing a meal: ? Fill half of your plate with vegetables and green salads. ? Fill one fourth of your plate with whole grains. Look for the word "whole" as the first word in the ingredient list. ? Fill one fourth of your plate with lean protein foods.  Eat more foods that have fiber, like apples, carrots, beans, peas, and barley.  Eat more home-cooked foods. Eat less at restaurants and buffets.  Limit or avoid alcohol.  Limit foods high in starch and sugar.  Limit fried foods.  Cook foods without frying them. Baking, boiling, grilling, and broiling are all great options.  Lose weight if you are overweight. Losing even a small amount of weight can help your overall health. It can also help prevent diseases such as diabetes and heart disease. What foods can I eat? Grains Whole grains, such as whole wheat or whole grain breads, crackers, cereals, and pasta. Unsweetened oatmeal, bulgur, barley, quinoa, or brown rice. Corn or whole wheat flour tortillas. Vegetables Fresh or frozen vegetables (raw, steamed, roasted, or grilled). Green salads. Fruits All fresh, canned (in natural juice), or frozen fruits. Meat and Other Protein Products Ground beef (85% or leaner), grass-fed beef, or beef trimmed of fat. Skinless chicken or [place]. Ground chicken or [place]. Pork trimmed  of fat. All fish and seafood. Eggs. Dried beans, peas, or lentils. Unsalted nuts or seeds. Unsalted canned or dry beans. Dairy Low-fat dairy products, such as skim or 1% milk, 2% or reduced-fat cheeses, low-fat ricotta or cottage cheese, or plain low-fat yogurt. Fats and Oils Tub  margarines without trans fats. Light or reduced-fat mayonnaise and salad dressings. Avocado. Olive, canola, sesame, or safflower oils. Natural peanut or almond butter (choose ones without added sugar and oil). The items listed above may not be a complete list of recommended foods or beverages. Contact your dietitian for more options. What foods are not recommended? Grains White bread. White pasta. White rice. Cornbread. Bagels, pastries, and croissants. Crackers that contain trans fat. Vegetables White potatoes. Corn. Creamed or fried vegetables. Vegetables in a cheese sauce. Fruits Dried fruits. Canned fruit in light or heavy syrup. Fruit juice. Meat and Other Protein Products Fatty cuts of meat. Ribs, chicken wings, bacon, sausage, bologna, salami, chitterlings, fatback, hot dogs, bratwurst, and packaged luncheon meats. Liver and organ meats. Dairy Whole or 2% milk, cream, half-and-half, and cream cheese. Whole milk cheeses. Whole-fat or sweetened yogurt. Full-fat cheeses. Nondairy creamers and whipped toppings. Processed cheese, cheese spreads, or cheese curds. Sweets and Desserts Corn syrup, sugars, honey, and molasses. Candy. Jam and jelly. Syrup. Sweetened cereals. Cookies, pies, cakes, donuts, muffins, and ice cream. Fats and Oils Butter, stick margarine, lard, shortening, ghee, or bacon fat. Coconut, palm kernel, or palm oils. Beverages Alcohol. Sweetened drinks (such as sodas, lemonade, and fruit drinks or punches). The items listed above may not be a complete list of foods and beverages to avoid. Contact your dietitian for more information. This information is not intended to replace advice given to you by your health care provider. Make sure you discuss any questions you have with your health care provider. Document Released: 11/19/2011 Document Revised: 01/25/2016 Document Reviewed: 08/19/2013 Elsevier Interactive Patient Education  2018 [employer].   Fat and Cholesterol  Restricted Diet Getting too much fat and cholesterol in your diet may cause health problems. Following this diet helps keep your fat and cholesterol at normal levels. This can keep you from getting sick. What types of fat should I choose?  Choose monosaturated and polyunsaturated fats. These are found in foods such as olive oil, canola oil, flaxseeds, walnuts, almonds, and seeds.  Eat more omega-3 fats. Good choices include salmon, mackerel, sardines, tuna, flaxseed oil, and ground flaxseeds.  Limit saturated fats. These are in animal products such as meats, butter, and cream. They can also be in plant products such as palm oil, palm kernel oil, and coconut oil.  Avoid foods with partially hydrogenated oils in them. These contain trans fats. Examples of foods that have trans fats are stick margarine, some tub margarines, cookies, crackers, and other baked goods. What general guidelines do I need to follow?  Check food labels. Look for the words "trans fat" and "saturated fat."  When preparing a meal: ? Fill half of your plate with vegetables and green salads. ? Fill one fourth of your plate with whole grains. Look for the word "whole" as the first word in the ingredient list. ? Fill one fourth of your plate with lean protein foods.  Eat more foods that have fiber, like apples, carrots, beans, peas, and barley.  Eat more home-cooked foods. Eat less at restaurants and buffets.  Limit or avoid alcohol.  Limit foods high in starch and sugar.  Limit fried foods.  Cook foods without frying them. Baking,  boiling, grilling, and broiling are all great options.  Lose weight if you are overweight. Losing even a small amount of weight can help your overall health. It can also help prevent diseases such as diabetes and heart disease. What foods can I eat? Grains Whole grains, such as whole wheat or whole grain breads, crackers, cereals, and pasta. Unsweetened oatmeal, bulgur, barley, quinoa, or  brown rice. Corn or whole wheat flour tortillas. Vegetables Fresh or frozen vegetables (raw, steamed, roasted, or grilled). Green salads. Fruits All fresh, canned (in natural juice), or frozen fruits. Meat and Other Protein Products Ground beef (85% or leaner), grass-fed beef, or beef trimmed of fat. Skinless chicken or [place]. Ground chicken or [place]. Pork trimmed of fat. All fish and seafood. Eggs. Dried beans, peas, or lentils. Unsalted nuts or seeds. Unsalted canned or dry beans. Dairy Low-fat dairy products, such as skim or 1% milk, 2% or reduced-fat cheeses, low-fat ricotta or cottage cheese, or plain low-fat yogurt. Fats and Oils Tub margarines without trans fats. Light or reduced-fat mayonnaise and salad dressings. Avocado. Olive, canola, sesame, or safflower oils. Natural peanut or almond butter (choose ones without added sugar and oil). The items listed above may not be a complete list of recommended foods or beverages. Contact your dietitian for more options. What foods are not recommended? Grains White bread. White pasta. White rice. Cornbread. Bagels, pastries, and croissants. Crackers that contain trans fat. Vegetables White potatoes. Corn. Creamed or fried vegetables. Vegetables in a cheese sauce. Fruits Dried fruits. Canned fruit in light or heavy syrup. Fruit juice. Meat and Other Protein Products Fatty cuts of meat. Ribs, chicken wings, bacon, sausage, bologna, salami, chitterlings, fatback, hot dogs, bratwurst, and packaged luncheon meats. Liver and organ meats. Dairy Whole or 2% milk, cream, half-and-half, and cream cheese. Whole milk cheeses. Whole-fat or sweetened yogurt. Full-fat cheeses. Nondairy creamers and whipped toppings. Processed cheese, cheese spreads, or cheese curds. Sweets and Desserts Corn syrup, sugars, honey, and molasses. Candy. Jam and jelly. Syrup. Sweetened cereals. Cookies, pies, cakes, donuts, muffins, and ice cream. Fats and Oils Butter, stick  margarine, lard, shortening, ghee, or bacon fat. Coconut, palm kernel, or palm oils. Beverages Alcohol. Sweetened drinks (such as sodas, lemonade, and fruit drinks or punches). The items listed above may not be a complete list of foods and beverages to avoid. Contact your dietitian for more information. This information is not intended to replace advice given to you by your health care provider. Make sure you discuss any questions you have with your health care provider. Document Released: 11/19/2011 Document Revised: 01/25/2016 Document Reviewed: 08/19/2013 Elsevier Interactive Patient Education  [employer].

## 2018-01-09 NOTE — Progress Notes (Signed)
Cardiology Office Note    Date:  01/09/2018   ID:  Haley Gonzales, DOB 1971-03-26, MRN [id number]  PCP:  Haley Sine, MD  Cardiologist:  Dr. Claiborne Billings   Chief Complaint  Patient presents with  . Follow-up    seen for Dr. Claiborne Billings. recent ED visit for chest pain    History of Present Illness:  Haley Gonzales is a 47 y.o. female with PMH of HTN, asthma and SVT.  Patient was diagnosed with SVT in December 2018 after she presented with palpitation, shortness of breath and vague chest discomfort.  She was cardioverted using adenosine.  She was advised to cut back on caffeine.  Her previous bisoprolol HCT has been switched to Toprol-XL.  Echocardiogram obtained on 07/24/2017 showed EF 60 to 65%, no significant valvular issue.  She also has iron deficiency anemia based on previous lab.  She was referred to GI service for further evaluation.  Her last office visit with Dr. Claiborne Billings was in April 2019 at which time she was doing well.   Patient presents today for cardiology office evaluation of chest pain.  Her chest pain actually preceded her diagnosis of SVT.  It may occur once every 1 to 79-month.  Typically she described her chest pain as a pulling sensation in the anterior chest.  It does not radiate and only last a few seconds at the time and appear in clusters.  She usually notices it when she is at rest and laying down at night.  She works at [employer] and has to walk around all day caring heavy boxes.  She never experienced any chest discomfort during exertion.  Her symptom of chest discomfort is very atypical, suspicion for coronary artery disease is very low.  I recommended a GXT as initial work-up.  If negative I would not recommend any further work-up.  Note her previous LDL was 152 on 07/22/2017, for someone as young as her, I am very hesitant to start on a statin medication.  I recommended she watch her diet for the next 2 months and recheck it at that time.  If still high we will add low-dose  statin.  Otherwise she has not seen by GI service yet, I recommend her to follow-up on the anemia.  Patient also admits to have a pica history of eating chalk.  She stopped eating chalk several months ago.  Her anemia is getting better at this time.  It is very possible that her pica was contributing to the iron deficiency anemia.   Past Medical History:  Diagnosis Date  . Hypertension   . SVT (supraventricular tachycardia) (HCC)     Past Surgical History:  Procedure Laterality Date  . CESAREAN SECTION      Current Medications: Outpatient Medications Prior to Visit  Medication Sig Dispense Refill  . amLODipine (NORVASC) 5 MG tablet Take 1 tablet (5 mg total) by mouth daily. 90 tablet 0  . metoprolol succinate (TOPROL-XL) 50 MG 24 hr tablet Take 1 tablet (50 mg total) by mouth daily. Take with or immediately following a meal. 90 tablet 3  . Omega-3 Fatty Acids (OMEGA-3 EPA FISH OIL PO) Take 1 capsule by mouth daily.    . iron polysaccharides (NIFEREX) 150 MG capsule Take 1 capsule (150 mg total) by mouth 2 (two) times daily. 60 capsule 2   No facility-administered medications prior to visit.      Allergies:   Patient has no known allergies.   Social History   Socioeconomic History  .  Marital status: Single    Spouse name: Not on file  . Number of children: Not on file  . Years of education: Not on file  . Highest education level: Not on file  Occupational History  . Not on file  Social Needs  . Financial resource strain: Not on file  . Food insecurity:    Worry: Not on file    Inability: Not on file  . Transportation needs:    Medical: Not on file    Non-medical: Not on file  Tobacco Use  . Smoking status: Former Research scientist (life sciences)  . Smokeless tobacco: Never Used  Substance and Sexual Activity  . Alcohol use: Yes    Comment: social  . Drug use: No  . Sexual activity: Not on file  Lifestyle  . Physical activity:    Days per week: Not on file    Minutes per session: Not on  file  . Stress: Not on file  Relationships  . Social connections:    Talks on phone: Not on file    Gets together: Not on file    Attends religious service: Not on file    Active member of club or organization: Not on file    Attends meetings of clubs or organizations: Not on file    Relationship status: Not on file  Other Topics Concern  . Not on file  Social History Narrative  . Not on file     Family History:  The patient's family history includes Hypertension in her mother; Lung cancer in her father.   ROS:   Please see the history of present illness.    ROS All other systems reviewed and are negative.   PHYSICAL EXAM:   VS:  BP 136/88   Pulse 62   Ht 5\' 2"  (1.575 m)   Wt 178 lb 9.6 oz (81 kg)   SpO2 98%   BMI 32.67 kg/m    GEN: Well nourished, well developed, in no acute distress  HEENT: normal  Neck: no JVD, carotid bruits, or masses Cardiac: RRR; no murmurs, rubs, or gallops,no edema  Respiratory:  clear to auscultation bilaterally, normal work of breathing GI: soft, nontender, nondistended, + BS MS: no deformity or atrophy  Skin: warm and dry, no rash Neuro:  Alert and Oriented x 3, Strength and sensation are intact Psych: euthymic mood, full affect  Wt Readings from Last 3 Encounters:  01/09/18 178 lb 9.6 oz (81 kg)  10/01/17 172 lb (78 kg)  09/05/17 172 lb 9.6 oz (78.3 kg)      Studies/Labs Reviewed:   EKG:  EKG is not ordered today.    Recent Labs: 07/22/2017: ALT 9; TSH 1.080 12/25/2017: BUN 9; Creatinine, Ser 0.84; Hemoglobin 10.3; Magnesium 1.9; Platelets 335; Potassium 4.0; Sodium 138   Lipid Panel    Component Value Date/Time   CHOL 244 (H) 07/22/2017 0949   TRIG 69 07/22/2017 0949   HDL 98 07/22/2017 0949   CHOLHDL 2.5 07/22/2017 0949   LDLCALC 132 (H) 07/22/2017 0949    Additional studies/ records that were reviewed today include:   Echo 07/24/2017 LV EF: 60% -   65% Study Conclusions  - Left ventricle: The cavity size was  normal. Wall thickness was   normal. Systolic function was normal. The estimated ejection   fraction was in the range of 60% to 65%. Left ventricular   diastolic function parameters were normal. - Mitral valve: Valve area by continuity equation (using LVOT   flow): 3.03 cm^2.  ASSESSMENT:    1. Atypical chest pain   2. Medication management   3. Pica in adults   4. SVT (supraventricular tachycardia) ([place])   5. Essential hypertension   6. Iron deficiency anemia, unspecified iron deficiency anemia type   7. Hyperlipidemia, unspecified hyperlipidemia type      PLAN:  In order of problems listed above:  1. Atypical chest pain: This is chronic for, no previous ischemic work-up.  Echocardiogram showed normal ejection fraction.  Her chest pain only occurs in clusters about once every 1 to 2 months.  It does not associated with exertion.  I recommend a GXT to further evaluate  2. Iron deficiency anemia: May be related to her pica history.  She says that she had a habit of eating edible chalk in the past.  She has stopped this habit several month ago.  She is currently on iron therapy.  Pending eval by GI  3. History of SVT: She denies any recent palpitation episode despite occurrence of chest pain.  This seems to be quite well controlled on the metoprolol.  4. Hypertension: Blood pressure well controlled on current medication.  5. Hyperlipidemia: I recommend diet and exercise for now.  Recheck lipid in 14-month.    Medication Adjustments/Labs and Tests Ordered: Current medicines are reviewed at length with the patient today.  Concerns regarding medicines are outlined above.  Medication changes, Labs and Tests ordered today are listed in the Patient Instructions below. Patient Instructions  Medication Instructions:  NO CHANGES- Your physician recommends that you continue on your current medications as directed. Please refer to the Current Medication list given to you today.  If you  need a refill on your cardiac medications before your next appointment, please call your pharmacy.  Labwork: FLP IN 2 MONTHS(OCT) HERE IN OUR OFFICE AT [facility]  Take the provided lab slips with you to the lab for your blood draw.   You will need to fast. DO NOT EAT OR DRINK PAST MIDNIGHT.   Testing/Procedures: Your physician has requested that you have an exercise tolerance test, this is a screening tool to track your fitness level. This test evaluates the your exercise capacity by measuring cardiovascular response to exercise, the stress response is induced by exercise (exercise-treadmill).  Graded exercise test is also known as maximal exercise test or stress EKG test  . Please also follow instruction sheet given. HOLD METOPROLOL THE DAY BEFORE AND RESTART AFTER TEST.  Special Instructions: MAKE SURE TO FOLLOW LOW CHOLESTEROL DIET AND EXERCISE TO IMPROVE YOUR CHOLESTEROL  Follow-Up: Your physician wants you to follow-up in: [place].   Thank you for choosing [facility] at [facility]!!      Fat and Cholesterol Restricted Diet Getting too much fat and cholesterol in your diet may cause health problems. Following this diet helps keep your fat and cholesterol at normal levels. This can keep you from getting sick. What types of fat should I choose?  Choose monosaturated and polyunsaturated fats. These are found in foods such as olive oil, canola oil, flaxseeds, walnuts, almonds, and seeds.  Eat more omega-3 fats. Good choices include salmon, mackerel, sardines, tuna, flaxseed oil, and ground flaxseeds.  Limit saturated fats. These are in animal products such as meats, butter, and cream. They can also be in plant products such as palm oil, palm kernel oil, and coconut oil.  Avoid foods with partially hydrogenated oils in them. These contain trans fats. Examples of foods that have trans fats  are stick margarine, some tub margarines, cookies, crackers,  and other baked goods. What general guidelines do I need to follow?  Check food labels. Look for the words "trans fat" and "saturated fat."  When preparing a meal: ? Fill half of your plate with vegetables and green salads. ? Fill one fourth of your plate with whole grains. Look for the word "whole" as the first word in the ingredient list. ? Fill one fourth of your plate with lean protein foods.  Eat more foods that have fiber, like apples, carrots, beans, peas, and barley.  Eat more home-cooked foods. Eat less at restaurants and buffets.  Limit or avoid alcohol.  Limit foods high in starch and sugar.  Limit fried foods.  Cook foods without frying them. Baking, boiling, grilling, and broiling are all great options.  Lose weight if you are overweight. Losing even a small amount of weight can help your overall health. It can also help prevent diseases such as diabetes and heart disease. What foods can I eat? Grains Whole grains, such as whole wheat or whole grain breads, crackers, cereals, and pasta. Unsweetened oatmeal, bulgur, barley, quinoa, or brown rice. Corn or whole wheat flour tortillas. Vegetables Fresh or frozen vegetables (raw, steamed, roasted, or grilled). Green salads. Fruits All fresh, canned (in natural juice), or frozen fruits. Meat and Other Protein Products Ground beef (85% or leaner), grass-fed beef, or beef trimmed of fat. Skinless chicken or [place]. Ground chicken or [place]. Pork trimmed of fat. All fish and seafood. Eggs. Dried beans, peas, or lentils. Unsalted nuts or seeds. Unsalted canned or dry beans. Dairy Low-fat dairy products, such as skim or 1% milk, 2% or reduced-fat cheeses, low-fat ricotta or cottage cheese, or plain low-fat yogurt. Fats and Oils Tub margarines without trans fats. Light or reduced-fat mayonnaise and salad dressings. Avocado. Olive, canola, sesame, or safflower oils. Natural peanut or almond butter (choose ones without added sugar  and oil). The items listed above may not be a complete list of recommended foods or beverages. Contact your dietitian for more options. What foods are not recommended? Grains White bread. White pasta. White rice. Cornbread. Bagels, pastries, and croissants. Crackers that contain trans fat. Vegetables White potatoes. Corn. Creamed or fried vegetables. Vegetables in a cheese sauce. Fruits Dried fruits. Canned fruit in light or heavy syrup. Fruit juice. Meat and Other Protein Products Fatty cuts of meat. Ribs, chicken wings, bacon, sausage, bologna, salami, chitterlings, fatback, hot dogs, bratwurst, and packaged luncheon meats. Liver and organ meats. Dairy Whole or 2% milk, cream, half-and-half, and cream cheese. Whole milk cheeses. Whole-fat or sweetened yogurt. Full-fat cheeses. Nondairy creamers and whipped toppings. Processed cheese, cheese spreads, or cheese curds. Sweets and Desserts Corn syrup, sugars, honey, and molasses. Candy. Jam and jelly. Syrup. Sweetened cereals. Cookies, pies, cakes, donuts, muffins, and ice cream. Fats and Oils Butter, stick margarine, lard, shortening, ghee, or bacon fat. Coconut, palm kernel, or palm oils. Beverages Alcohol. Sweetened drinks (such as sodas, lemonade, and fruit drinks or punches). The items listed above may not be a complete list of foods and beverages to avoid. Contact your dietitian for more information. This information is not intended to replace advice given to you by your health care provider. Make sure you discuss any questions you have with your health care provider. Document Released: 11/19/2011 Document Revised: 01/25/2016 Document Reviewed: 08/19/2013 Elsevier Interactive Patient Education  2018 [place] and Cholesterol Restricted Diet Getting too much fat and cholesterol in your  diet may cause health problems. Following this diet helps keep your fat and cholesterol at normal levels. This can keep you from getting  sick. What types of fat should I choose?  Choose monosaturated and polyunsaturated fats. These are found in foods such as olive oil, canola oil, flaxseeds, walnuts, almonds, and seeds.  Eat more omega-3 fats. Good choices include salmon, mackerel, sardines, tuna, flaxseed oil, and ground flaxseeds.  Limit saturated fats. These are in animal products such as meats, butter, and cream. They can also be in plant products such as palm oil, palm kernel oil, and coconut oil.  Avoid foods with partially hydrogenated oils in them. These contain trans fats. Examples of foods that have trans fats are stick margarine, some tub margarines, cookies, crackers, and other baked goods. What general guidelines do I need to follow?  Check food labels. Look for the words "trans fat" and "saturated fat."  When preparing a meal: ? Fill half of your plate with vegetables and green salads. ? Fill one fourth of your plate with whole grains. Look for the word "whole" as the first word in the ingredient list. ? Fill one fourth of your plate with lean protein foods.  Eat more foods that have fiber, like apples, carrots, beans, peas, and barley.  Eat more home-cooked foods. Eat less at restaurants and buffets.  Limit or avoid alcohol.  Limit foods high in starch and sugar.  Limit fried foods.  Cook foods without frying them. Baking, boiling, grilling, and broiling are all great options.  Lose weight if you are overweight. Losing even a small amount of weight can help your overall health. It can also help prevent diseases such as diabetes and heart disease. What foods can I eat? Grains Whole grains, such as whole wheat or whole grain breads, crackers, cereals, and pasta. Unsweetened oatmeal, bulgur, barley, quinoa, or brown rice. Corn or whole wheat flour tortillas. Vegetables Fresh or frozen vegetables (raw, steamed, roasted, or grilled). Green salads. Fruits All fresh, canned (in natural juice), or frozen  fruits. Meat and Other Protein Products Ground beef (85% or leaner), grass-fed beef, or beef trimmed of fat. Skinless chicken or [place]. Ground chicken or [place]. Pork trimmed of fat. All fish and seafood. Eggs. Dried beans, peas, or lentils. Unsalted nuts or seeds. Unsalted canned or dry beans. Dairy Low-fat dairy products, such as skim or 1% milk, 2% or reduced-fat cheeses, low-fat ricotta or cottage cheese, or plain low-fat yogurt. Fats and Oils Tub margarines without trans fats. Light or reduced-fat mayonnaise and salad dressings. Avocado. Olive, canola, sesame, or safflower oils. Natural peanut or almond butter (choose ones without added sugar and oil). The items listed above may not be a complete list of recommended foods or beverages. Contact your dietitian for more options. What foods are not recommended? Grains White bread. White pasta. White rice. Cornbread. Bagels, pastries, and croissants. Crackers that contain trans fat. Vegetables White potatoes. Corn. Creamed or fried vegetables. Vegetables in a cheese sauce. Fruits Dried fruits. Canned fruit in light or heavy syrup. Fruit juice. Meat and Other Protein Products Fatty cuts of meat. Ribs, chicken wings, bacon, sausage, bologna, salami, chitterlings, fatback, hot dogs, bratwurst, and packaged luncheon meats. Liver and organ meats. Dairy Whole or 2% milk, cream, half-and-half, and cream cheese. Whole milk cheeses. Whole-fat or sweetened yogurt. Full-fat cheeses. Nondairy creamers and whipped toppings. Processed cheese, cheese spreads, or cheese curds. Sweets and Desserts Corn syrup, sugars, honey, and molasses. Candy. Jam and jelly. Syrup. Sweetened cereals.  Cookies, pies, cakes, donuts, muffins, and ice cream. Fats and Oils Butter, stick margarine, lard, shortening, ghee, or bacon fat. Coconut, palm kernel, or palm oils. Beverages Alcohol. Sweetened drinks (such as sodas, lemonade, and fruit drinks or punches). The items listed  above may not be a complete list of foods and beverages to avoid. Contact your dietitian for more information. This information is not intended to replace advice given to you by your health care provider. Make sure you discuss any questions you have with your health care provider. Document Released: 11/19/2011 Document Revised: 01/25/2016 Document Reviewed: 08/19/2013 Elsevier Interactive Patient Education  2018 [place], [place], [place]  01/09/2018 10:51 AM    [address], [place], [place]  [address] Phone: [phone]; Fax: [phone]

## 2018-01-15 ENCOUNTER — Telehealth (HOSPITAL_COMMUNITY): Payer: Self-pay

## 2018-01-15 NOTE — Telephone Encounter (Signed)
Encounter complete. 

## 2018-01-16 ENCOUNTER — Telehealth (HOSPITAL_COMMUNITY): Payer: Self-pay

## 2018-01-16 NOTE — Telephone Encounter (Signed)
Encounter complete. 

## 2018-01-20 ENCOUNTER — Ambulatory Visit (HOSPITAL_COMMUNITY)
Admission: RE | Admit: 2018-01-20 | Discharge: 2018-01-20 | Disposition: A | Discharge status: Home / Self Care | Payer: Self-pay | Source: Ambulatory Visit | Attending: Cardiovascular Disease | Admitting: Cardiovascular Disease

## 2018-01-20 DIAGNOSIS — R0789 Other chest pain: Secondary | ICD-10-CM | POA: Insufficient documentation

## 2018-01-20 DIAGNOSIS — Z79899 Other long term (current) drug therapy: Secondary | ICD-10-CM | POA: Insufficient documentation

## 2018-01-20 LAB — EXERCISE TOLERANCE TEST
CHL RATE OF PERCEIVED EXERTION: 17
CSEPED: 8 min
CSEPEDS: 19 s
CSEPHR: 97 %
Estimated workload: 10.1 METS
MPHR: 174 {beats}/min
Peak HR: 169 {beats}/min
Rest HR: 80 {beats}/min

## 2018-03-12 ENCOUNTER — Encounter

## 2018-03-17 ENCOUNTER — Ambulatory Visit: Payer: Self-pay | Admitting: Cardiovascular Disease

## 2018-03-18 ENCOUNTER — Encounter: Payer: Self-pay | Admitting: *Deleted

## 2018-04-02 ENCOUNTER — Encounter: Payer: Self-pay | Admitting: Physician Assistant

## 2018-04-02 ENCOUNTER — Ambulatory Visit (INDEPENDENT_AMBULATORY_CARE_PROVIDER_SITE_OTHER): Payer: Self-pay | Admitting: Physician Assistant

## 2018-04-02 VITALS — BP 161/95 | HR 81 | Resp 16 | Ht 62.0 in | Wt 179.8 lb

## 2018-04-02 DIAGNOSIS — D649 Anemia, unspecified: Secondary | ICD-10-CM

## 2018-04-02 DIAGNOSIS — I10 Essential (primary) hypertension: Secondary | ICD-10-CM

## 2018-04-02 DIAGNOSIS — I471 Supraventricular tachycardia: Secondary | ICD-10-CM

## 2018-04-02 MED ORDER — AMLODIPINE BESYLATE 5 MG PO TABS: 5.0000 mg | ORAL_TABLET | Freq: Every day | ORAL | 1 refills | Status: DC

## 2018-04-02 MED ORDER — METOPROLOL SUCCINATE ER 50 MG PO TB24: 50.0000 mg | ORAL_TABLET | Freq: Every day | ORAL | 1 refills | Status: DC

## 2018-04-02 MED ORDER — POLYSACCHARIDE IRON COMPLEX 150 MG PO CAPS
150.0000 mg | ORAL_CAPSULE | Freq: Two times a day (BID) | ORAL | 0 refills | Status: DC
Start: 2018-04-02 — End: 2018-08-26

## 2018-04-02 NOTE — Progress Notes (Signed)
Cardiology Office Note    Date:  04/02/2018   ID:  Haley Gonzales, DOB 04/20/1971, MRN [id number]  PCP:  Troy Sine, MD  Cardiologist:  Dr. Claiborne Billings   Chief Complaint  Patient presents with  . Medication Refill    seen for Dr. Claiborne Billings.     History of Present Illness:  Haley Gonzales is a 47 y.o. female with PMH of HTN, asthma and SVT.  Patient was diagnosed with SVT in December 2018 after she presented with palpitation, shortness of breath and vague chest discomfort.  She was cardioverted using adenosine.  She was advised to cut back on caffeine.  Her previous bisoprolol HCT has been switched to Toprol-XL.  Echocardiogram obtained on 07/24/2017 showed EF 60 to 65%, no significant valvular issue.  She also has iron deficiency anemia based on previous lab.  She was referred to GI service for further evaluation.  Her last office visit with Dr. Claiborne Billings was in April 2019 at which time she was doing well.   Patient presents today for cardiology office visit.  He she denies any recent chest pain or shortness of breath.  Her blood pressure is elevated today, she has ran out of medication for the past 2 days.  I refilled her amlodipine and metoprolol.  As far as her iron supplement, I can give her a 30-day supply, however she will need to see her primary care provider who can give her more refill.  Otherwise she has been doing well from cardiology perspective.  If she does have recurrent palpitation, she has the option of taking an additional dose of metoprolol on a as needed basis.  Otherwise she can follow-up in 9 to 36-month.    Past Medical History:  Diagnosis Date  . Hypertension   . SVT (supraventricular tachycardia) (HCC)     Past Surgical History:  Procedure Laterality Date  . CESAREAN SECTION      Current Medications: Outpatient Medications Prior to Visit  Medication Sig Dispense Refill  . Omega-3 Fatty Acids (OMEGA-3 EPA FISH OIL PO) Take 1 capsule by mouth daily.    Marland Kitchen  amLODipine (NORVASC) 5 MG tablet Take 1 tablet (5 mg total) by mouth daily. 90 tablet 0  . iron polysaccharides (NIFEREX) 150 MG capsule Take 1 capsule (150 mg total) by mouth 2 (two) times daily. 120 capsule 0  . metoprolol succinate (TOPROL-XL) 50 MG 24 hr tablet Take 1 tablet (50 mg total) by mouth daily. Take with or immediately following a meal. 90 tablet 3   No facility-administered medications prior to visit.      Allergies:   Patient has no known allergies.   Social History   Socioeconomic History  . Marital status: Single    Spouse name: Not on file  . Number of children: Not on file  . Years of education: Not on file  . Highest education level: Not on file  Occupational History  . Not on file  Social Needs  . Financial resource strain: Not on file  . Food insecurity:    Worry: Not on file    Inability: Not on file  . Transportation needs:    Medical: Not on file    Non-medical: Not on file  Tobacco Use  . Smoking status: Former Research scientist (life sciences)  . Smokeless tobacco: Never Used  Substance and Sexual Activity  . Alcohol use: Yes    Comment: social  . Drug use: No  . Sexual activity: Not on file  Lifestyle  .  Physical activity:    Days per week: Not on file    Minutes per session: Not on file  . Stress: Not on file  Relationships  . Social connections:    Talks on phone: Not on file    Gets together: Not on file    Attends religious service: Not on file    Active member of club or organization: Not on file    Attends meetings of clubs or organizations: Not on file    Relationship status: Not on file  Other Topics Concern  . Not on file  Social History Narrative  . Not on file     Family History:  The patient's family history includes Hypertension in her mother; Lung cancer in her father.   ROS:   Please see the history of present illness.    ROS All other systems reviewed and are negative.   PHYSICAL EXAM:   VS:  BP (!) 161/95 (BP Location: Right Arm, Cuff  Size: Normal)   Pulse 81   Resp 16   Ht 5\' 2"  (1.575 m)   Wt 179 lb 12.8 oz (81.6 kg)   SpO2 96%   BMI 32.89 kg/m    GEN: Well nourished, well developed, in no acute distress  HEENT: normal  Neck: no JVD, carotid bruits, or masses Cardiac: RRR; no murmurs, rubs, or gallops,no edema  Respiratory:  clear to auscultation bilaterally, normal work of breathing GI: soft, nontender, nondistended, + BS MS: no deformity or atrophy  Skin: warm and dry, no rash Neuro:  Alert and Oriented x 3, Strength and sensation are intact Psych: euthymic mood, full affect  Wt Readings from Last 3 Encounters:  04/02/18 179 lb 12.8 oz (81.6 kg)  01/09/18 178 lb 9.6 oz (81 kg)  10/01/17 172 lb (78 kg)      Studies/Labs Reviewed:   EKG:  EKG is not ordered today.   Recent Labs: 07/22/2017: ALT 9; TSH 1.080 12/25/2017: BUN 9; Creatinine, Ser 0.84; Hemoglobin 10.3; Magnesium 1.9; Platelets 335; Potassium 4.0; Sodium 138   Lipid Panel    Component Value Date/Time   CHOL 244 (H) 07/22/2017 0949   TRIG 69 07/22/2017 0949   HDL 98 07/22/2017 0949   CHOLHDL 2.5 07/22/2017 0949   LDLCALC 132 (H) 07/22/2017 0949    Additional studies/ records that were reviewed today include:   Echo 07/24/2017 LV EF: 60% -   65% Study Conclusions  - Left ventricle: The cavity size was normal. Wall thickness was   normal. Systolic function was normal. The estimated ejection   fraction was in the range of 60% to 65%. Left ventricular   diastolic function parameters were normal. - Mitral valve: Valve area by continuity equation (using LVOT   flow): 3.03 cm^2.    ETT 01/20/2018 Study Highlights    Blood pressure demonstrated a hypertensive response to exercise.  There was no ST segment deviation noted during stress.  No ST changes of ischemia.  Normal exercise tolerance achieving 10.1 mets at an adequate HR response of 97% MPHR.  This is a low risk study.    ASSESSMENT:    1. SVT (supraventricular  tachycardia) ([place])   2. Essential hypertension   3. Anemia, unspecified type      PLAN:  In order of problems listed above:  1. History of SVT: No recurrence, well controlled on metoprolol.  She is aware that she may take extra dose of metoprolol on a as needed basis for recurrent SVT.  2.  Hypertension: Blood pressure is elevated today after she ran out of all for blood pressure medication 3 days ago.  I have refilled her metoprolol and amlodipine.  3. History of anemia: On iron therapy, I did give her 30-day supply, however if she will require any refills she will need to discuss with her primary care provider.   Medication Adjustments/Labs and Tests Ordered: Current medicines are reviewed at length with the patient today.  Concerns regarding medicines are outlined above.  Medication changes, Labs and Tests ordered today are listed in the Patient Instructions below. Patient Instructions  Medication Instructions:  You may take an extra metoprolol tablet as needed for SVT.  If you need a refill on your cardiac medications before your next appointment, please call your pharmacy.   Lab work: none   Testing/Procedures: none  Follow-Up: At [facility], you and your health needs are our priority.  As part of our continuing mission to provide you with exceptional heart care, we have created designated Provider Care Teams.  These Care Teams include your primary Cardiologist (physician) and Advanced Practice Providers (APPs -  Physician Assistants and Nurse Practitioners) who all work together to provide you with the care you need, when you need it. You will need a follow up appointment in 12 months with Dr. Claiborne Billings.  Please call our office 2 months in advance to schedule this appointment.  You may see Shelva Majestic, MD or one of the following Advanced Practice Providers on your designated Care Team: [place], [place] . Fabian Sharp, PA-C  Any Other Special Instructions Will Be Listed Below  (If Applicable). none      Hilbert Corrigan, [place]  04/02/2018 8:59 PM    [facility] [place], [place], [place]  [address] Phone: [phone]; Fax: [phone]

## 2018-04-02 NOTE — Patient Instructions (Signed)
Medication Instructions:  You may take an extra metoprolol tablet as needed for SVT.  If you need a refill on your cardiac medications before your next appointment, please call your pharmacy.   Lab work: none   Testing/Procedures: none  Follow-Up: At [facility], you and your health needs are our priority.  As part of our continuing mission to provide you with exceptional heart care, we have created designated Provider Care Teams.  These Care Teams include your primary Cardiologist (physician) and Advanced Practice Providers (APPs -  Physician Assistants and Nurse Practitioners) who all work together to provide you with the care you need, when you need it. You will need a follow up appointment in 12 months with Dr. Claiborne Billings.  Please call our office 2 months in advance to schedule this appointment.  You may see Shelva Majestic, MD or one of the following Advanced Practice Providers on your designated Care Team: [place], [place] . Fabian Sharp, PA-C  Any Other Special Instructions Will Be Listed Below (If Applicable). none

## 2018-07-23 ENCOUNTER — Telehealth: Payer: Self-pay | Admitting: Cardiovascular Disease

## 2018-07-23 NOTE — Telephone Encounter (Signed)
Unable to reach pt or leave a message  

## 2018-07-23 NOTE — Telephone Encounter (Signed)
New message   Pt c/o of Chest Pain: STAT if CP now or developed within 24 hours  1. Are you having CP right now?no   2. Are you experiencing any other symptoms (ex. SOB, nausea, vomiting, sweating)? B/p is elevated,   3. How long have you been experiencing CP? Last  2 days   4. Is your CP continuous or coming and going? Come and go   5. Have you taken Nitroglycerin? No  ?

## 2018-07-24 NOTE — Telephone Encounter (Signed)
Attempted to contact patient, no answer/no voicemail to leave message.

## 2018-07-27 NOTE — Telephone Encounter (Signed)
Unable to reach pt once again Will await return call from pt ./cy

## 2018-08-13 ENCOUNTER — Emergency Department (HOSPITAL_COMMUNITY)
Admission: EM | Admit: 2018-08-13 | Discharge: 2018-08-13 | Disposition: A | Discharge status: Home / Self Care | Payer: BLUE CROSS/BLUE SHIELD | Attending: Emergency Medicine | Admitting: Emergency Medicine

## 2018-08-13 ENCOUNTER — Encounter (HOSPITAL_COMMUNITY): Payer: Self-pay | Admitting: Emergency Medicine

## 2018-08-13 ENCOUNTER — Emergency Department (HOSPITAL_COMMUNITY): Payer: BLUE CROSS/BLUE SHIELD

## 2018-08-13 DIAGNOSIS — R079 Chest pain, unspecified: Secondary | ICD-10-CM | POA: Diagnosis present

## 2018-08-13 DIAGNOSIS — Z5321 Procedure and treatment not carried out due to patient leaving prior to being seen by health care provider: Secondary | ICD-10-CM | POA: Diagnosis not present

## 2018-08-13 LAB — CBC
HCT: 29.5 % — ABNORMAL LOW (ref 36.0–46.0)
Hemoglobin: 8.2 g/dL — ABNORMAL LOW (ref 12.0–15.0)
MCH: 21 pg — ABNORMAL LOW (ref 26.0–34.0)
MCHC: 27.8 g/dL — AB (ref 30.0–36.0)
MCV: 75.6 fL — AB (ref 80.0–100.0)
Platelets: 390 10*3/uL (ref 150–400)
RBC: 3.9 MIL/uL (ref 3.87–5.11)
RDW: 16.3 % — ABNORMAL HIGH (ref 11.5–15.5)
WBC: 15.7 10*3/uL — ABNORMAL HIGH (ref 4.0–10.5)
nRBC: 0 % (ref 0.0–0.2)

## 2018-08-13 LAB — BASIC METABOLIC PANEL
Anion gap: 11 (ref 5–15)
BUN: 6 mg/dL (ref 6–20)
CO2: 19 mmol/L — ABNORMAL LOW (ref 22–32)
Calcium: 9.1 mg/dL (ref 8.9–10.3)
Chloride: 105 mmol/L (ref 98–111)
Creatinine, Ser: 0.8 mg/dL (ref 0.44–1.00)
GFR calc Af Amer: >60 mL/min (ref >60)
GFR calc non Af Amer: >60 mL/min (ref >60)
Glucose, Bld: 140 mg/dL — ABNORMAL HIGH (ref 70–99)
Potassium: 3.6 mmol/L (ref 3.5–5.1)
Sodium: 135 mmol/L (ref 135–145)

## 2018-08-13 LAB — I-STAT BETA HCG BLOOD, ED (MC, WL, AP ONLY): I-stat hCG, quantitative: <5 m[IU]/mL (ref <5)

## 2018-08-13 LAB — I-STAT TROPONIN, ED: Troponin i, poc: 0 ng/mL (ref 0.00–0.08)

## 2018-08-13 MED ORDER — SODIUM CHLORIDE 0.9% FLUSH: 3.0000 mL | Freq: Once | INTRAVENOUS | Status: DC

## 2018-08-26 ENCOUNTER — Telehealth: Payer: Self-pay | Admitting: Cardiovascular Disease

## 2018-08-26 MED ORDER — POLYSACCHARIDE IRON COMPLEX 150 MG PO CAPS: 150.0000 mg | ORAL_CAPSULE | Freq: Two times a day (BID) | ORAL | 3 refills | Status: DC

## 2018-08-26 NOTE — Telephone Encounter (Signed)
New Message   Pt c/o of Chest Pain: STAT if CP now or developed within 24 hours  1. Are you having CP right now? No, it comes and goes   2. Are you experiencing any other symptoms (ex. SOB, nausea, vomiting, sweating)?  NO  3. How long have you been experiencing CP? Off and on for about a week  4. Is your CP continuous or coming and going? Coming and going   5. Have you taken Nitroglycerin? No ?

## 2018-08-26 NOTE — Telephone Encounter (Signed)
Pt called to report that she has had some pain in her chest in the center and dull in nature... she denies cough, fever, sob, dizziness, palpitations... she says it comes and goes and happens at rest and with exertion... pt was in the hosp recently but left AMA because it was taking so long.... she had normal troponins at that visit.Marland Kitchen she had normal stress test 01/2018...   Pt had low Hgb at that hosp visit and elevated WBC's..pt reports that she has habit of eating corn starch and this has lowered her Hgb in the past.   After talking with Dr. Claiborne Billings.. will refill her Fe supplement.Marland Kitchen to monitor her chest pain.Marland Kitchen and if worsens may got to the urgent care..  I gave the pt the number for [facility] so she can find herself a PMD. Pt agreed.     COVID-19 Pre-Screening Questions:  . Do you currently have a fever? No (yes = cancel and refer to pcp for e-visit) . Have you recently travelled on a cruise, internationally, or to [place], [place], [place], [place], [place], or [place], [place] [employer]) ? NO (yes = cancel, stay home, monitor symptoms, and contact pcp or initiate e-visit if symptoms develop) . Have you been in contact with someone that is currently pending confirmation of Covid19 testing or has been confirmed to have the [place] virus?  NO (yes = cancel, stay home, away from tested individual, monitor symptoms, and contact pcp or initiate e-visit if symptoms develop) . Are you currently experiencing fatigue or cough? NO (yes = pt should be prepared to have a mask placed at the time of their visit).           Marland Kitchen

## 2018-08-27 NOTE — Telephone Encounter (Signed)
Noted. Thanks.

## 2018-11-11 ENCOUNTER — Encounter (HOSPITAL_COMMUNITY): Payer: Self-pay | Admitting: Emergency Medicine

## 2018-11-11 ENCOUNTER — Other Ambulatory Visit: Payer: Self-pay

## 2018-11-11 ENCOUNTER — Emergency Department (HOSPITAL_COMMUNITY)
Admission: EM | Admit: 2018-11-11 | Discharge: 2018-11-11 | Disposition: A | Discharge status: Home / Self Care | Payer: BC Managed Care – PPO | Attending: Emergency Medicine | Admitting: Emergency Medicine

## 2018-11-11 DIAGNOSIS — Z87891 Personal history of nicotine dependence: Secondary | ICD-10-CM | POA: Diagnosis not present

## 2018-11-11 DIAGNOSIS — I10 Essential (primary) hypertension: Secondary | ICD-10-CM | POA: Diagnosis not present

## 2018-11-11 DIAGNOSIS — Z79899 Other long term (current) drug therapy: Secondary | ICD-10-CM | POA: Insufficient documentation

## 2018-11-11 DIAGNOSIS — I471 Supraventricular tachycardia: Secondary | ICD-10-CM | POA: Diagnosis not present

## 2018-11-11 DIAGNOSIS — R002 Palpitations: Secondary | ICD-10-CM | POA: Diagnosis present

## 2018-11-11 NOTE — ED Triage Notes (Signed)
Pt in with c/o palpitations, onset 30 min PTA. Hx of SVT, has had to be cardioverted in past. Pt states she took "1 tablet of Metoprolol PTA". States she has been out her Metoprolol 2 days prior and drank a Pepsi before bed last night. Mild chest tightness, HR 200

## 2018-11-11 NOTE — ED Provider Notes (Signed)
[facility] EMERGENCY DEPARTMENT Provider Note   CSN: [id number] Arrival date & time: 11/11/18  0409    History   Chief Complaint Chief Complaint  Patient presents with   Palpitations   Chest Pain    HPI Haley Gonzales is a 48 y.o. female.   The history is provided by the patient.  Palpitations  Associated symptoms: chest pain   Chest Pain  Associated symptoms: palpitations   He has a history of hypertension and supraventricular tachycardia, and comes in complaining of a rapid heart rate which started about 30 minutes ago.  She states that she was already awake.  She denies chest pain, heaviness, tightness, pressure.  She denies dyspnea, nausea, vomiting.  She took a dose of metoprolol at home with no relief, so she came to the ED.  This is typical for her episodes of supraventricular tachycardia.  Past Medical History:  Diagnosis Date   Hypertension    SVT (supraventricular tachycardia) ([place])     There are no active problems to display for this patient.   Past Surgical History:  Procedure Laterality Date   CESAREAN SECTION       OB History   No obstetric history on file.      Home Medications    Prior to Admission medications   Medication Sig Start Date End Date Taking? Authorizing Provider  amLODipine (NORVASC) 5 MG tablet Take 1 tablet (5 mg total) by mouth daily. 04/02/18 07/01/18  Almyra Deforest, PA  iron polysaccharides (NIFEREX) 150 MG capsule Take 1 capsule (150 mg total) by mouth 2 (two) times daily. 08/26/18   Troy Sine, MD  metoprolol succinate (TOPROL-XL) 50 MG 24 hr tablet Take 1 tablet (50 mg total) by mouth daily. Take with or immediately following a meal. 04/02/18 07/01/18  Almyra Deforest, PA  Omega-3 Fatty Acids (OMEGA-3 EPA FISH OIL PO) Take 1 capsule by mouth daily.    [provider]    Family History Family History  Problem Relation Age of Onset   Hypertension Mother    Lung cancer Father     Social  History Social History   Tobacco Use   Smoking status: Former Smoker   Smokeless tobacco: Never Used  Substance Use Topics   Alcohol use: Yes    Comment: social   Drug use: No     Allergies   Patient has no known allergies.   Review of Systems Review of Systems  Cardiovascular: Positive for chest pain and palpitations.  All other systems reviewed and are negative.    Physical Exam Updated Vital Signs BP (!) 124/91    Pulse (!) 193    Resp 20    Wt 79.4 kg    SpO2 100%    BMI 32.02 kg/m   Physical Exam Vitals signs and nursing note reviewed.    48 year old female, resting comfortably and in no acute distress. Vital signs are significant for rapid heart rate and borderline elevated blood pressure. Oxygen saturation is 100%, which is normal. Head is normocephalic and atraumatic. PERRLA, EOMI. Oropharynx is clear. Neck is nontender and supple without adenopathy or JVD. Back is nontender and there is no CVA tenderness. Lungs are clear without rales, wheezes, or rhonchi. Chest is nontender. Heart is tachycardic without murmur. Abdomen is soft, flat, nontender without masses or hepatosplenomegaly and peristalsis is normoactive. Extremities have no cyanosis or edema, full range of motion is present. Skin is warm and dry without rash. Neurologic: Mental status is  normal, cranial nerves are intact, there are no motor or sensory deficits.  ED Treatments / Results   EKG EKG Interpretation  Date/Time:  Wednesday November 11 2018 04:24:03 EDT Ventricular Rate:  196 PR Interval:    QRS Duration: 66 QT Interval:  222 QTC Calculation: 401 R Axis:   31 Text Interpretation:  Supraventricular tachycardia Marked ST abnormality, possible inferior subendocardial injury Abnormal ECG When compared with ECG of 08/13/2018, Supraventricular tachycardia has replaced Sinus tachycardia ST abnormality is now present - probably rate-related Confirmed by Delora Fuel ([id number]) on 11/11/2018  4:35:31 AM   EKG Interpretation  Date/Time:  Wednesday November 11 2018 04:33:13 EDT Ventricular Rate:  110 PR Interval:    QRS Duration: 79 QT Interval:  314 QTC Calculation: 425 R Axis:   43 Text Interpretation:  Sinus tachycardia Otherwise within normal limits When compared with ECG of EARLIER SAME DATE Sinus tachycardia has replaced Supraventricular tachycardia ST depression is no longer present - apparently was rate-related Confirmed by Delora Fuel ([id number]) on 11/11/2018 4:37:13 AM       Procedures Procedures   Medications Ordered in ED Medications - No data to display   Initial Impression / Assessment and Plan / ED Course  I have reviewed the triage vital signs and the nursing notes.  Supraventricular tachycardia.  As patient was being hooked up to monitor, carotid sinus massage was done with no obvious change in heart rate.  However, when monitor leads were applied, rhythm was sinus tachycardia and not SVT.  Patient states that she felt better.  Initial ECG from triage clearly showed supraventricular tachycardia with ST depression which is felt to be rate related.  Following conversion, ECG shows sinus tachycardia with no ST changes.  No indication for lab testing.  She will be observed for a brief period prior to discharge.  Old records are reviewed, and she does have a prior ED visit for supraventricular tachycardia as well as several ED visits for atypical chest pain.  5:11 AM She has been observed in the ED with no recurrence of arrhythmia.  She is discharged with instructions to follow-up with PCP.  Final Clinical Impressions(s) / ED Diagnoses   Final diagnoses:  SVT (supraventricular tachycardia) [facility])    ED Discharge Orders    None       Delora Fuel, MD 39/76/73 [phone]

## 2018-11-11 NOTE — ED Notes (Addendum)
Pt brought to trauma room and placed on cardiac monitor and continuous pulse ox.  Dr. Roxanne Mins at bedside.  PT has converted to ST@110 .  Repeat EKG obtained.

## 2018-11-12 ENCOUNTER — Telehealth: Payer: Self-pay | Admitting: Physician Assistant

## 2018-11-12 NOTE — Telephone Encounter (Signed)
New Message:      Pt was seen in [place] on 11-11-18/ Pt was told to follow up with Cardiologist. I offered pt a Virtual Visit on Monday(11-15-28) with Kerin Ransom. Pt said she would like to be seen in the office please.

## 2018-11-12 NOTE — Telephone Encounter (Signed)
Spoke with patient of Dr. Claiborne Billings who was in ED for SVT yesterday. She is wanting an in office appointment for f/up instead of virtual despite having explained the virtual visit process to her. Scheduled her for OV on 6/15 @ 0915 with Angie PA      COVID-19 Pre-Screening Questions:  . In the past 7 to 10 days have you had a cough,  shortness of breath, headache, congestion, fever (100 or greater) body aches, chills, sore throat, or sudden loss of taste or sense of smell? NO . Have you been around anyone with known Covid 19? NO . Have you been around anyone who is awaiting Covid 19 test results in the past 7 to 10 days? NO . Have you been around anyone who has been exposed to Covid 19, or has mentioned symptoms of Covid 19 within the past 7 to 10 days? NO

## 2018-11-13 NOTE — Progress Notes (Signed)
Cardiology Office Note:    Date:  11/16/2018   ID:  Haley Gonzales, DOB [birth year], MRN [id number]  PCP:  No primary care provider on file.  Cardiologist:  Shelva Majestic, MD   Chief Complaint  Patient presents with   Follow-up    SVT    History of Present Illness:    Haley Gonzales is a 48 y.o. female with a PMH of SVT, HTN, anemia, asthma, and several ED visits for atypical chest pain.   She was first diagnosed with SVT in December 2018.  She presents today for follow-up of her SVT.  She was last evaluated by cardiology at an outpatient visit with Almyra Deforest, PA-C 03/2018, at which time she was noted to have an elevated blood pressure after being out of her medications for 2 days. She was recommended to take an as needed extra dose of metoprolol for recurrent symptoms. She was initially diagnosed with SVT 05/2017, at which time she was cardioverted using adenosine and she was started on toprol for rate control. Her last echocardiogram was February 2019 which showed preserved EF of 60 to 65% and no significant valvular disease.   Her last ischemic evaluation was an ETT 01/20/18 - she achieved 10.1 mets with adequate HR response - low risk study.   She presents today for follow-up of her SVT. She was seen in [facility] 11/11/18 for palpitations and chest pain. Triage EKG showed SVT; however, when patient was brought back to a room in the ER and telemetry was applied, her rhythm was sinus tachycardia following carotid sinus massage. She reported waking up around 3am and feeling off on 11/11/2018, then subsequently developed tachycardia for which she took her metoprolol succinate 30 minutes prior to her ED visit. She reported several days prior to this episode she had attempted to wean herself off of metoprolol succinate. This is her first recurrent episodes since initial diagnosis 05/2017. At her visit today she denies any tachy-palpitations over the past 5 days. No complaints of chest pain, SOB, fever,  orthopnea, PND, LE edema. Her blood pressure is elevated today. She does endorse some dietary indiscretion with salt intake. We discussed the importance of limiting salt in hopes of avoiding additional blood pressure medications at this time.     Past Medical History:  Diagnosis Date   Hypertension    SVT (supraventricular tachycardia) (HCC)     Past Surgical History:  Procedure Laterality Date   CESAREAN SECTION      Current Medications: Current Meds  Medication Sig   amLODipine (NORVASC) 5 MG tablet Take 1 tablet (5 mg total) by mouth daily.   metoprolol succinate (TOPROL-XL) 50 MG 24 hr tablet Take 1 tablet (50 mg total) by mouth daily. Take with or immediately following a meal.   [DISCONTINUED] iron polysaccharides (NIFEREX) 150 MG capsule Take 1 capsule (150 mg total) by mouth 2 (two) times daily.   [DISCONTINUED] Omega-3 Fatty Acids (OMEGA-3 EPA FISH OIL PO) Take 1 capsule by mouth daily.     Allergies:   Patient has no known allergies.   Social History   Socioeconomic History   Marital status: Single    Spouse name: Not on file   Number of children: Not on file   Years of education: Not on file   Highest education level: Not on file  Occupational History   Not on file  Social Needs   Financial resource strain: Not on file   Food insecurity    Worry: Not on file  Inability: Not on file   Transportation needs    Medical: Not on file    Non-medical: Not on file  Tobacco Use   Smoking status: Former Smoker   Smokeless tobacco: Never Used  Substance and Sexual Activity   Alcohol use: Yes    Comment: social   Drug use: No   Sexual activity: Not on file  Lifestyle   Physical activity    Days per week: Not on file    Minutes per session: Not on file   Stress: Not on file  Relationships   Social connections    Talks on phone: Not on file    Gets together: Not on file    Attends religious service: Not on file    Active member of club  or organization: Not on file    Attends meetings of clubs or organizations: Not on file    Relationship status: Not on file  Other Topics Concern   Not on file  Social History Narrative   Not on file     Family History: The patient's family history includes Hypertension in her mother; Lung cancer in her father.  ROS:   Please see the history of present illness.     All other systems reviewed and are negative.  EKGs/Labs/Other Studies Reviewed:    The following studies were reviewed today:  ETT 01/20/18:  Blood pressure demonstrated a hypertensive response to exercise.  There was no ST segment deviation noted during stress.  No ST changes of ischemia.  Normal exercise tolerance achieving 10.1 mets at an adequate HR response of 97% MPHR.  This is a low risk study.   EKG:  EKG is not ordered today.  EKG 11/11/2018 reviewed and revealed SVT with rate 196 with ST abnormalities 2/2 rate which were resolved on subsequent EKG revealing sinus tachycardia.   Recent Labs: 12/25/2017: Magnesium 1.9 08/13/2018: BUN 6; Creatinine, Ser 0.80; Hemoglobin 8.2; Platelets 390; Potassium 3.6; Sodium 135  Recent Lipid Panel    Component Value Date/Time   CHOL 244 (H) 07/22/2017 0949   TRIG 69 07/22/2017 0949   HDL 98 07/22/2017 0949   CHOLHDL 2.5 07/22/2017 0949   LDLCALC 132 (H) 07/22/2017 0949    Physical Exam:    VS:  BP 137/84    Pulse 75    Temp (!) 97.3 F (36.3 C)    Ht 5\' 2"  (1.575 m)    Wt 179 lb (81.2 kg)    SpO2 99%    BMI 32.74 kg/m     Wt Readings from Last 3 Encounters:  11/16/18 179 lb (81.2 kg)  11/11/18 175 lb 0.7 oz (79.4 kg)  08/13/18 175 lb (79.4 kg)     GEN: Well nourished, well developed in no acute distress HEENT: sclera anicteric NECK: No JVD; No carotid bruits LYMPHATICS: No lymphadenopathy CARDIAC: RRR, no murmurs, rubs, gallops RESPIRATORY:  Clear to auscultation without rales, wheezing or rhonchi  ABDOMEN: Soft, non-tender,  non-distended MUSCULOSKELETAL:  No edema; No deformity  SKIN: Warm and dry NEUROLOGIC:  Alert and oriented x 3 PSYCHIATRIC:  Normal affect   ASSESSMENT:    1. SVT (supraventricular tachycardia) (Ad[facility])   2. Essential hypertension   3. Atypical chest pain    PLAN:     1. SVT: patient had recurrent SVT 11/11/2018 which resolved after taking po metoprolol succinate and carotid massage in the ED. This is her second episode since initial diagnosis 05/2017 and occurred after she reported trying to wean off metoprolol succinate ~  3 days prior to this episode. We discussed vagal maneuvers she can perform at home in the event of recurrent SVT. We also discussed indications for taking an extra dose of metoprolol succinate if persistent tachycardia.  - Continue metoprolol succinate 50mg  daily. She was instructed to take an extra dose x1 as needed for persistent tachycardia  2. HTN: BP 137/84 today. We discussed importance of maintaining a low sodium diet and an active lifestyle in an effort to bring her blood pressure down to goal of <130/80 - Continue amlodipine and metoprolol succinate   3. Atypical chest pain: No recent episodes of chest pain. ETT 01/2018 was low risk and without evidence of ischemia.  - Continue risk factor management with good blood pressure control    Medication Adjustments/Labs and Tests Ordered: Current medicines are reviewed at length with the patient today.  Concerns regarding medicines are outlined above.   - No medication changes occurred at this visit.   Patient to follow-up with Dr. Claiborne Billings in 1 year or sooner if issues arise.   Signed, Abigail Butts, PA-C  11/16/2018 10:09 AM    [place]

## 2018-11-16 ENCOUNTER — Ambulatory Visit (INDEPENDENT_AMBULATORY_CARE_PROVIDER_SITE_OTHER): Payer: BC Managed Care – PPO | Admitting: Medical

## 2018-11-16 ENCOUNTER — Encounter: Payer: Self-pay | Admitting: Physician Assistant

## 2018-11-16 ENCOUNTER — Telehealth: Payer: BC Managed Care – PPO | Admitting: Cardiology

## 2018-11-16 ENCOUNTER — Other Ambulatory Visit: Payer: Self-pay

## 2018-11-16 VITALS — BP 137/84 | HR 75 | Temp 97.3°F | Ht 62.0 in | Wt 179.0 lb

## 2018-11-16 DIAGNOSIS — I10 Essential (primary) hypertension: Secondary | ICD-10-CM | POA: Diagnosis not present

## 2018-11-16 DIAGNOSIS — R0789 Other chest pain: Secondary | ICD-10-CM

## 2018-11-16 DIAGNOSIS — I471 Supraventricular tachycardia: Secondary | ICD-10-CM | POA: Diagnosis not present

## 2018-11-16 NOTE — Patient Instructions (Signed)
Medication Instructions:   Take an extra dose of metoprolol as needed for heart rate greater than 120 for 10 minutes or more.  If you need a refill on your cardiac medications before your next appointment, please call your pharmacy.    Follow-Up: At [facility], you and your health needs are our priority.  As part of our continuing mission to provide you with exceptional heart care, we have created designated Provider Care Teams.  These Care Teams include your primary Cardiologist (physician) and Advanced Practice Providers (APPs -  Physician Assistants and Nurse Practitioners) who all work together to provide you with the care you need, when you need it. You will need a follow up appointment in 1 year.  Please call our office 2 months in advance to schedule this appointment.  You may see Shelva Majestic, MD or one of the following Advanced Practice Providers on your designated Care Team: [place], [place] . Fabian Sharp, PA-C  Any Other Special Instructions Will Be Listed Below (If Applicable).  Heart-Healthy Eating Plan Heart-healthy meal planning includes:  Eating less unhealthy fats.  Eating more healthy fats.  Making other changes in your diet. Talk with your doctor or a diet specialist (dietitian) to create an eating plan that is right for you. What is my plan? Your doctor may recommend an eating plan that includes:  Total fat: ______% or less of total calories a day.  Saturated fat: ______% or less of total calories a day.  Cholesterol: less than _________mg a day. What are tips for following this plan? Cooking Avoid frying your food. Try to bake, boil, grill, or broil it instead. You can also reduce fat by:  Removing the skin from poultry.  Removing all visible fats from meats.  Steaming vegetables in water or broth. Meal planning   At meals, divide your plate into four equal parts: ? Fill one-half of your plate with vegetables and green salads. ? Fill one-fourth of  your plate with whole grains. ? Fill one-fourth of your plate with lean protein foods.  Eat 4-5 servings of vegetables per day. A serving of vegetables is: ? 1 cup of raw or cooked vegetables. ? 2 cups of raw leafy greens.  Eat 4-5 servings of fruit per day. A serving of fruit is: ? 1 medium whole fruit. ?  cup of dried fruit. ?  cup of fresh, frozen, or canned fruit. ?  cup of 100% fruit juice.  Eat more foods that have soluble fiber. These are apples, broccoli, carrots, beans, peas, and barley. Try to get 20-30 g of fiber per day.  Eat 4-5 servings of nuts, legumes, and seeds per week: ? 1 serving of dried beans or legumes equals  cup after being cooked. ? 1 serving of nuts is  cup. ? 1 serving of seeds equals 1 tablespoon. General information  Eat more home-cooked food. Eat less restaurant, buffet, and fast food.  Limit or avoid alcohol.  Limit foods that are high in starch and sugar.  Avoid fried foods.  Lose weight if you are overweight.  Keep track of how much salt (sodium) you eat. This is important if you have high blood pressure. Ask your doctor to tell you more about this.  Try to add vegetarian meals each week. Fats  Choose healthy fats. These include olive oil and canola oil, flaxseeds, walnuts, almonds, and seeds.  Eat more omega-3 fats. These include salmon, mackerel, sardines, tuna, flaxseed oil, and ground flaxseeds. Try to eat fish  at least 2 times each week.  Check food labels. Avoid foods with trans fats or high amounts of saturated fat.  Limit saturated fats. ? These are often found in animal products, such as meats, butter, and cream. ? These are also found in plant foods, such as palm oil, palm kernel oil, and coconut oil.  Avoid foods with partially hydrogenated oils in them. These have trans fats. Examples are stick margarine, some tub margarines, cookies, crackers, and other baked goods. What foods can I eat? Fruits All fresh, canned (in  natural juice), or frozen fruits. Vegetables Fresh or frozen vegetables (raw, steamed, roasted, or grilled). Green salads. Grains Most grains. Choose whole wheat and whole grains most of the time. Rice and pasta, including brown rice and pastas made with whole wheat. Meats and other proteins Lean, well-trimmed beef, veal, pork, and lamb. Chicken and [place] without skin. All fish and shellfish. Wild duck, rabbit, pheasant, and venison. Egg whites or low-cholesterol egg substitutes. Dried beans, peas, lentils, and tofu. Seeds and most nuts. Dairy Low-fat or nonfat cheeses, including ricotta and mozzarella. Skim or 1% milk that is liquid, powdered, or evaporated. Buttermilk that is made with low-fat milk. Nonfat or low-fat yogurt. Fats and oils Non-hydrogenated (trans-free) margarines. Vegetable oils, including soybean, sesame, sunflower, olive, peanut, safflower, corn, canola, and cottonseed. Salad dressings or mayonnaise made with a vegetable oil. Beverages Mineral water. Coffee and tea. Diet carbonated beverages. Sweets and desserts Sherbet, gelatin, and fruit ice. Small amounts of dark chocolate. Limit all sweets and desserts. Seasonings and condiments All seasonings and condiments. The items listed above may not be a complete list of foods and drinks you can eat. Contact a dietitian for more options. What foods should I avoid? Fruits Canned fruit in heavy syrup. Fruit in cream or butter sauce. Fried fruit. Limit coconut. Vegetables Vegetables cooked in cheese, cream, or butter sauce. Fried vegetables. Grains Breads that are made with saturated or trans fats, oils, or whole milk. Croissants. Sweet rolls. Donuts. High-fat crackers, such as cheese crackers. Meats and other proteins Fatty meats, such as hot dogs, ribs, sausage, bacon, rib-eye roast or steak. High-fat deli meats, such as salami and bologna. Caviar. Domestic duck and goose. Organ meats, such as liver. Dairy Cream, sour  cream, cream cheese, and creamed cottage cheese. Whole-milk cheeses. Whole or 2% milk that is liquid, evaporated, or condensed. Whole buttermilk. Cream sauce or high-fat cheese sauce. Yogurt that is made from whole milk. Fats and oils Meat fat, or shortening. Cocoa butter, hydrogenated oils, palm oil, coconut oil, palm kernel oil. Solid fats and shortenings, including bacon fat, salt pork, lard, and butter. Nondairy cream substitutes. Salad dressings with cheese or sour cream. Beverages Regular sodas and juice drinks with added sugar. Sweets and desserts Frosting. Pudding. Cookies. Cakes. Pies. Milk chocolate or white chocolate. Buttered syrups. Full-fat ice cream or ice cream drinks. The items listed above may not be a complete list of foods and drinks to avoid. Contact a dietitian for more information. Summary  Heart-healthy meal planning includes eating less unhealthy fats, eating more healthy fats, and making other changes in your diet.  Eat a balanced diet. This includes fruits and vegetables, low-fat or nonfat dairy, lean protein, nuts and legumes, whole grains, and heart-healthy oils and fats. This information is not intended to replace advice given to you by your health care provider. Make sure you discuss any questions you have with your health care provider. Document Released: 11/19/2011 Document Revised: 06/27/2017 Document Reviewed: 06/27/2017  Chartered certified accountant Patient Education  [employer].

## 2018-12-07 ENCOUNTER — Other Ambulatory Visit: Payer: Self-pay | Admitting: Physician Assistant

## 2018-12-07 NOTE — Telephone Encounter (Signed)
Rx(s) sent to pharmacy electronically.  

## 2019-01-25 ENCOUNTER — Encounter (HOSPITAL_COMMUNITY): Payer: Self-pay

## 2019-01-25 ENCOUNTER — Emergency Department (HOSPITAL_COMMUNITY)
Admission: EM | Admit: 2019-01-25 | Discharge: 2019-01-25 | Disposition: A | Discharge status: Home / Self Care | Payer: BC Managed Care – PPO | Attending: Emergency Medicine | Admitting: Emergency Medicine

## 2019-01-25 ENCOUNTER — Other Ambulatory Visit: Payer: Self-pay

## 2019-01-25 DIAGNOSIS — I10 Essential (primary) hypertension: Secondary | ICD-10-CM | POA: Insufficient documentation

## 2019-01-25 DIAGNOSIS — R102 Pelvic and perineal pain: Secondary | ICD-10-CM | POA: Diagnosis present

## 2019-01-25 DIAGNOSIS — R1909 Other intra-abdominal and pelvic swelling, mass and lump: Secondary | ICD-10-CM | POA: Diagnosis not present

## 2019-01-25 DIAGNOSIS — Z87891 Personal history of nicotine dependence: Secondary | ICD-10-CM | POA: Diagnosis not present

## 2019-01-25 DIAGNOSIS — N819 Female genital prolapse, unspecified: Secondary | ICD-10-CM | POA: Diagnosis not present

## 2019-01-25 DIAGNOSIS — Z79899 Other long term (current) drug therapy: Secondary | ICD-10-CM | POA: Diagnosis not present

## 2019-01-25 LAB — URINALYSIS, ROUTINE W REFLEX MICROSCOPIC
Bilirubin Urine: NEGATIVE
Glucose, UA: NEGATIVE mg/dL
Hgb urine dipstick: NEGATIVE
Ketones, ur: NEGATIVE mg/dL
Leukocytes,Ua: NEGATIVE
Nitrite: NEGATIVE
Protein, ur: NEGATIVE mg/dL
Specific Gravity, Urine: 1.003 — ABNORMAL LOW (ref 1.005–1.030)
pH: 8 (ref 5.0–8.0)

## 2019-01-25 LAB — I-STAT BETA HCG BLOOD, ED (MC, WL, AP ONLY): I-stat hCG, quantitative: <5 m[IU]/mL (ref <5)

## 2019-01-25 LAB — WET PREP, GENITAL
Clue Cells Wet Prep HPF POC: NONE SEEN
Sperm: NONE SEEN
Trich, Wet Prep: NONE SEEN
Yeast Wet Prep HPF POC: NONE SEEN

## 2019-01-25 NOTE — ED Provider Notes (Signed)
[facility] EMERGENCY DEPARTMENT Provider Note   CSN: [id number] Arrival date & time: 01/25/19  1623     History   Chief Complaint Chief Complaint  Patient presents with  . Vaginal Pain    HPI Haley Gonzales is a 48 y.o. female presents to the ER for evaluation of painless "lump" that she felt through her vagina while she was urinating.  This occurred this afternoon while she was at work and urinating.  The lump is described as painless, small.  She went to the restroom here to urinate and also felt that again.  She denies any vaginal pain, itching, bleeding.  States 2 days ago she started noticing slight changes to her vaginal discharge.  No odor.  She has had 4 pregnancies, 2 vaginal deliveries and 2 cesarean sections.  History of bilateral tubal ligation.  She is sexually active with her husband without condom use.  She is not concerned about an STD.  She denies any fever, nausea, vomiting, abdominal pain, dysuria, hematuria, urinary urgency or frequency. No interventions. No alleviating factors. Course is constant, persistent.      HPI  Past Medical History:  Diagnosis Date  . Hypertension   . SVT (supraventricular tachycardia) (HCC)     There are no active problems to display for this patient.   Past Surgical History:  Procedure Laterality Date  . CESAREAN SECTION       OB History   No obstetric history on file.      Home Medications    Prior to Admission medications   Medication Sig Start Date End Date Taking? Authorizing Provider  amLODipine (NORVASC) 5 MG tablet Take 5 mg by mouth daily.   Yes [provider]  atorvastatin (LIPITOR) 40 MG tablet Take 40 mg by mouth daily.   Yes [provider]  metoprolol succinate (TOPROL-XL) 50 MG 24 hr tablet Take 1 tablet (50 mg total) by mouth daily. 12/07/18  Yes Troy Sine, MD    Family History Family History  Problem Relation Age of Onset  . Hypertension Mother   . Lung  cancer Father     Social History Social History   Tobacco Use  . Smoking status: Former Research scientist (life sciences)  . Smokeless tobacco: Never Used  Substance Use Topics  . Alcohol use: Yes    Comment: social  . Drug use: No     Allergies   Patient has no known allergies.   Review of Systems Review of Systems  Genitourinary: Positive for vaginal discharge.       Vaginal bulging/lump   All other systems reviewed and are negative.    Physical Exam Updated Vital Signs BP (!) 141/81 (BP Location: Right Arm)   Pulse 75   Temp 98.8 F (37.1 C) (Oral)   Resp 14   SpO2 100%   Physical Exam Vitals signs and nursing note reviewed.  Constitutional:      Appearance: She is well-developed.     Comments: Non toxic in NAD  HENT:     Head: Normocephalic and atraumatic.     Nose: Nose normal.  Eyes:     Conjunctiva/sclera: Conjunctivae normal.  Neck:     Musculoskeletal: Normal range of motion.  Cardiovascular:     Rate and Rhythm: Normal rate and regular rhythm.  Pulmonary:     Effort: Pulmonary effort is normal.     Breath sounds: Normal breath sounds.  Abdominal:     General: Bowel sounds are normal.  Palpations: Abdomen is soft.     Tenderness: There is no abdominal tenderness.     Comments: No G/R/R. No suprapubic or CVA tenderness. Negative Murphy's and McBurney's. Active BS to lower quadrants.   Genitourinary:    Vagina: Vaginal discharge present.     Comments:  Exam performed with RN at bedside for assistance. External genitalia without lesions.  No groin lymphadenopathy.  Vaginal mucosa and cervix pink without lesions.  Scant likely physiologic clear/white discharge in vaginal vault noted.  No CMT.  Nonpalpable, nontender adnexa.  Exam performed while supine. When bearing down patient had very subtle prolapse of vaginal mucosa that easily self reduced.  No obvious bladder, cervical or rectal prolapse noted or palpated during exam.  No vaginal lesions. Perianal skin normal  without lesions.  Musculoskeletal: Normal range of motion.  Skin:    General: Skin is warm and dry.     Capillary Refill: Capillary refill takes less than 2 seconds.  Neurological:     Mental Status: She is alert.  Psychiatric:        Behavior: Behavior normal.      ED Treatments / Results  Labs (all labs ordered are listed, but only abnormal results are displayed) Labs Reviewed  WET PREP, GENITAL - Abnormal; Notable for the following components:      Result Value   WBC, Wet Prep HPF POC FEW (*)    All other components within normal limits  URINALYSIS, ROUTINE W REFLEX MICROSCOPIC - Abnormal; Notable for the following components:   Color, Urine STRAW (*)    Specific Gravity, Urine 1.003 (*)    All other components within normal limits  I-STAT BETA HCG BLOOD, ED ([facility], [facility], [facility] ONLY)  GC/CHLAMYDIA PROBE AMP ([facility]) NOT AT [facility]    EKG None  Radiology No results found.  Procedures Procedures (including critical care time)  Medications Ordered in ED Medications - No data to display   Initial Impression / Assessment and Plan / ED Course  I have reviewed the triage vital signs and the nursing notes.  Pertinent labs & imaging results that were available during my care of the patient were reviewed by me and considered in my medical decision making (see chart for details).     History and exam mostly suggest pelvic organ prolapse/dysfunction.  This was very minimally noted on exam.  Mostly noticed some vaginal mucosa/tissue prolapsing out that easily reduced.  Did not see frank bladder, cervical, rectal prolapse through the vaginal opening on my exam.  Scant amount of likely physiological discharge noted.  Wet prep shows few WBC but no yeast, trichomoniasis, clue cells.  Urinalysis is without infection.  hCG is negative.  She has no associated pelvic or abdominal pain, tenderness.  She is low risk for STD and is not concerned for this.  I do not think that an STD is causing  her symptoms.  No CMT, fevers, chills, pelvic pain to suggest PID.  No abscess on exam. No Barholonin's gland abscess noted. Discussed likely etiology is mild pelvic organ prolapse/dysfunction/weakening.  Recommended GYN formal evaluation for formal diagnosis, treatment.  Recommended avoiding heavy lifting, bearing down, constipation.  Return precautions discussed.  Patient is comfortable with this.  Final Clinical Impressions(s) / ED Diagnoses   Final diagnoses:  Bulge in groin area  Female genital prolapse, unspecified type    ED Discharge Orders    None       Arlean Hopping 01/25/19 2055    Tegeler, Harrell Gave  J, MD 01/25/19 2349

## 2019-01-25 NOTE — ED Notes (Signed)
Pt verbalizes understanding of d/c instructions. Pt ambulatory at d/c with all belongings.   

## 2019-01-25 NOTE — ED Triage Notes (Signed)
Pt reports sent went to the restroom and she felt a hard lump inside her vagina, denies any current bleeding but was spotting 2 days ago. Hx of tubal ligation, denies and abd pain or n/v.

## 2019-01-25 NOTE — Discharge Instructions (Signed)
You were seen in the ER for bulge in your vaginal area.  Exam reveals very small amount of bulging through your vaginal opening.  I suspect this is prolapsing of vaginal tissue, possibly bladder.  It was difficult to determine exactly what was bulging out however the prolapse was very very small.  Urinalysis was checked and was normal.  Pelvic exam was otherwise normal.  Swabs today are normal.  Your gonorrhea and Chlamydia tests are pending but I do not think these are causing any of your symptoms.  Further, formal evaluation should be done by GYN doctor.  Avoid bearing down, lifting heavy objects.  This can worsen prolapse.  Return to the ER if there is vaginal itching, pain, bleeding or significant bulging comes out and is not able to be gently pushed back in.

## 2019-01-27 LAB — GC/CHLAMYDIA PROBE AMP (~~LOC~~) NOT AT ARMC
Chlamydia: NEGATIVE
Neisseria Gonorrhea: NEGATIVE

## 2019-02-12 ENCOUNTER — Other Ambulatory Visit: Payer: Self-pay | Admitting: Physician Assistant

## 2019-03-21 ENCOUNTER — Other Ambulatory Visit: Payer: Self-pay | Admitting: Cardiovascular Disease

## 2019-08-25 ENCOUNTER — Other Ambulatory Visit: Payer: Self-pay | Admitting: Cardiovascular Disease

## 2019-09-07 ENCOUNTER — Encounter (HOSPITAL_COMMUNITY): Payer: Self-pay

## 2019-09-07 ENCOUNTER — Ambulatory Visit (HOSPITAL_COMMUNITY)
Admission: EM | Admit: 2019-09-07 | Discharge: 2019-09-07 | Disposition: A | Discharge status: Home / Self Care | Payer: BC Managed Care – PPO | Attending: Urgent Care | Admitting: Urgent Care

## 2019-09-07 ENCOUNTER — Other Ambulatory Visit: Payer: Self-pay

## 2019-09-07 DIAGNOSIS — F410 Panic disorder [episodic paroxysmal anxiety] without agoraphobia: Secondary | ICD-10-CM | POA: Diagnosis not present

## 2019-09-07 DIAGNOSIS — I10 Essential (primary) hypertension: Secondary | ICD-10-CM

## 2019-09-07 DIAGNOSIS — Z8679 Personal history of other diseases of the circulatory system: Secondary | ICD-10-CM

## 2019-09-07 MED ORDER — HYDROXYZINE HCL 25 MG PO TABS: 25.0000 mg | ORAL_TABLET | Freq: Three times a day (TID) | ORAL | 0 refills | Status: DC | PRN

## 2019-09-07 NOTE — ED Triage Notes (Signed)
Pt states she has been having Panic attacks the last 2 days.

## 2019-09-07 NOTE — ED Provider Notes (Signed)
[facility]   MRN: [id number] DOB: [birth year]  Subjective:   Haley Gonzales is a 49 y.o. female presenting for 2 day history of acute onset recurrent panic attacks. Has had intermittent random panic attacks involving shob, heart racing, lightheadedness. Has felt a little weak. 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 Had a nightmare a couple of days about her brother, he passed 2 years ago. Has also felt fatigued. Has history of SVT, uses metoprolol for this. Has HTN, takes amlodipine. Also has a hx of anemia. Otherwise, patient is not stressed about finances, has good support network. Denies SI, passive SI, HI. Work at [employer] is very stressful. Denies smoking cigarettes. Occasional alcohol drink but not lately. Stopped using marijuana years ago.   No current facility-administered medications for this encounter.  Current Outpatient Medications:  .  amLODipine (NORVASC) 5 MG tablet, Take 1 tablet by mouth once daily, Disp: 90 tablet, Rfl: 0 .  atorvastatin (LIPITOR) 40 MG tablet, Take 40 mg by mouth daily., Disp: , Rfl:  .  metoprolol succinate (TOPROL-XL) 50 MG 24 hr tablet, Take 1 tablet (50 mg total) by mouth daily., Disp: 90 tablet, Rfl: 3   No Known Allergies  Past Medical History:  Diagnosis Date  . Hypertension   . SVT (supraventricular tachycardia) (HCC)      Past Surgical History:  Procedure Laterality Date  . CESAREAN SECTION      Family History  Problem Relation Age of Onset  . Hypertension Mother   . Lung cancer Father     Social History   Tobacco Use  . Smoking status: Former Games developer  . Smokeless tobacco: Never Used  Substance Use Topics  . Alcohol use: Yes    Comment: social  . Drug use: No    ROS   Objective:   Vitals: BP (!) 143/88 (BP Location: Right Arm)   Pulse 97   Temp 98.9 F (37.2 C) (Oral)   Resp 16   Wt 170 lb (77.1 kg)   LMP 09/03/2019   SpO2 100%   BMI 31.09 kg/m   Pulse 80 on recheck by PA Haley Gonzales.  Physical  Exam Constitutional:      General: She is not in acute distress.    Appearance: Normal appearance. She is well-developed. She is not ill-appearing, toxic-appearing or diaphoretic.  HENT:     Head: Normocephalic and atraumatic.     Nose: Nose normal.     Mouth/Throat:     Mouth: Mucous membranes are moist.  Eyes:     Extraocular Movements: Extraocular movements intact.     Pupils: Pupils are equal, round, and reactive to light.  Cardiovascular:     Rate and Rhythm: Normal rate and regular rhythm.     Pulses: Normal pulses.     Heart sounds: Normal heart sounds. No murmur. No friction rub. No gallop.   Pulmonary:     Effort: Pulmonary effort is normal. No respiratory distress.     Breath sounds: Normal breath sounds. No stridor. No wheezing, rhonchi or rales.  Skin:    General: Skin is warm and dry.     Findings: No rash.  Neurological:     Mental Status: She is alert and oriented to person, place, and time.  Psychiatric:        Mood and Affect: Mood normal. Mood is not anxious, depressed or elated. Affect is not labile, blunt, flat, angry, tearful or inappropriate.        Speech: She is communicative.  Speech is not rapid and pressured, delayed or slurred.        Behavior: Behavior normal. Behavior is not agitated, aggressive or hyperactive. Behavior is cooperative.        Thought Content: Thought content normal. Thought content is not paranoid or delusional. Thought content does not include homicidal or suicidal ideation.        Cognition and Memory: Cognition and memory normal.        Judgment: Judgment normal. Judgment is not impulsive or inappropriate.     Assessment and Plan :   1. Panic attacks   2. Essential hypertension   3. Personal history of supraventricular tachycardia     Counseled patient that highly recommend she follow-up with her PCP for recheck on hormonal source of her symptoms.  She is very well-appearing in clinic, has no signs of anxiety.  Counseled that  she would need to have her thyroid checked, adrenal glands checked as well.  In the meantime, start hydroxyzine to help with intermittent panic attacks.  Note for work provided to the patient. Counseled patient on potential for adverse effects with medications prescribed/recommended today, ER and return-to-clinic precautions discussed, patient verbalized understanding.    Wallis Bamberg, [place] 09/07/19 1603

## 2019-09-07 NOTE — Discharge Instructions (Signed)
Make sure you follow up with your PCP, regular doctor, about having your hormones checked including those for your thyroid gland and adrenal glands as these can be sources for your symptoms.

## 2019-11-30 ENCOUNTER — Other Ambulatory Visit: Payer: Self-pay | Admitting: Medical

## 2020-02-04 ENCOUNTER — Other Ambulatory Visit: Payer: Self-pay | Admitting: Cardiovascular Disease

## 2020-03-16 ENCOUNTER — Other Ambulatory Visit: Payer: Self-pay | Admitting: Medical

## 2020-04-16 ENCOUNTER — Other Ambulatory Visit: Payer: Self-pay | Admitting: Cardiovascular Disease

## 2020-04-17 NOTE — Telephone Encounter (Signed)
Rx has been sent to the pharmacy electronically. ° °

## 2020-04-19 ENCOUNTER — Encounter (HOSPITAL_COMMUNITY): Payer: Self-pay | Admitting: Emergency Medicine

## 2020-04-19 ENCOUNTER — Encounter (HOSPITAL_COMMUNITY): Payer: Self-pay | Admitting: *Deleted

## 2020-04-19 ENCOUNTER — Other Ambulatory Visit: Payer: Self-pay

## 2020-04-19 ENCOUNTER — Emergency Department (HOSPITAL_COMMUNITY)
Admission: EM | Admit: 2020-04-19 | Discharge: 2020-04-20 | Disposition: A | Discharge status: Home / Self Care | Payer: BC Managed Care – PPO | Attending: Emergency Medicine | Admitting: Emergency Medicine

## 2020-04-19 ENCOUNTER — Ambulatory Visit (HOSPITAL_COMMUNITY)
Admission: EM | Admit: 2020-04-19 | Discharge: 2020-04-19 | Disposition: A | Discharge status: Home / Self Care | Payer: BC Managed Care – PPO | Attending: Family Medicine | Admitting: Family Medicine

## 2020-04-19 ENCOUNTER — Emergency Department (HOSPITAL_COMMUNITY): Payer: BC Managed Care – PPO

## 2020-04-19 DIAGNOSIS — E785 Hyperlipidemia, unspecified: Secondary | ICD-10-CM

## 2020-04-19 DIAGNOSIS — R079 Chest pain, unspecified: Secondary | ICD-10-CM | POA: Diagnosis not present

## 2020-04-19 DIAGNOSIS — R0789 Other chest pain: Secondary | ICD-10-CM

## 2020-04-19 DIAGNOSIS — I10 Essential (primary) hypertension: Secondary | ICD-10-CM

## 2020-04-19 DIAGNOSIS — Z87891 Personal history of nicotine dependence: Secondary | ICD-10-CM | POA: Diagnosis not present

## 2020-04-19 DIAGNOSIS — F410 Panic disorder [episodic paroxysmal anxiety] without agoraphobia: Secondary | ICD-10-CM

## 2020-04-19 DIAGNOSIS — Z79899 Other long term (current) drug therapy: Secondary | ICD-10-CM | POA: Insufficient documentation

## 2020-04-19 HISTORY — DX: Anemia, unspecified: D64.9

## 2020-04-19 LAB — CBC
HCT: 34.4 % — ABNORMAL LOW (ref 36.0–46.0)
Hemoglobin: 10 g/dL — ABNORMAL LOW (ref 12.0–15.0)
MCH: 24.4 pg — ABNORMAL LOW (ref 26.0–34.0)
MCHC: 29.1 g/dL — ABNORMAL LOW (ref 30.0–36.0)
MCV: 83.9 fL (ref 80.0–100.0)
Platelets: 381 10*3/uL (ref 150–400)
RBC: 4.1 MIL/uL (ref 3.87–5.11)
RDW: 19.9 % — ABNORMAL HIGH (ref 11.5–15.5)
WBC: 6.2 10*3/uL (ref 4.0–10.5)
nRBC: 0 % (ref 0.0–0.2)

## 2020-04-19 LAB — BASIC METABOLIC PANEL
Anion gap: 5 (ref 5–15)
BUN: 8 mg/dL (ref 6–20)
CO2: 23 mmol/L (ref 22–32)
Calcium: 8.9 mg/dL (ref 8.9–10.3)
Chloride: 104 mmol/L (ref 98–111)
Creatinine, Ser: 0.85 mg/dL (ref 0.44–1.00)
GFR, Estimated: >60 mL/min (ref >60)
Glucose, Bld: 107 mg/dL — ABNORMAL HIGH (ref 70–99)
Potassium: 3.6 mmol/L (ref 3.5–5.1)
Sodium: 132 mmol/L — ABNORMAL LOW (ref 135–145)

## 2020-04-19 LAB — I-STAT BETA HCG BLOOD, ED (MC, WL, AP ONLY): I-stat hCG, quantitative: <5 m[IU]/mL (ref <5)

## 2020-04-19 LAB — TROPONIN I (HIGH SENSITIVITY)
Troponin I (High Sensitivity): 4 ng/L (ref <18)
Troponin I (High Sensitivity): 3 ng/L (ref <18)

## 2020-04-19 MED ORDER — ATORVASTATIN CALCIUM 40 MG PO TABS: 40.0000 mg | ORAL_TABLET | Freq: Every day | ORAL | 1 refills | Status: DC

## 2020-04-19 NOTE — ED Triage Notes (Signed)
Pt stating she had a panic attack yesterday. Today with onset of cp and shortness of breath. Has had a recent cough. Went to UC prior, sent here for further eval.

## 2020-04-19 NOTE — Discharge Instructions (Signed)
You have been seen at the [facility] today for chest pain. Your evaluation today was not suggestive of any emergent condition requiring medical intervention at this time. Your ECG (heart tracing) did not show any worrisome changes. However, some medical problems make take more time to appear. Therefore, it's very important that you pay attention to any new symptoms or worsening of your current condition.  Please proceed directly to the Emergency Department immediately should you feel worse in any way or have any of the following symptoms: increasing or different chest pain, pain that spreads to your arm, neck, jaw, back or abdomen, shortness of breath, or nausea and vomiting.

## 2020-04-19 NOTE — ED Provider Notes (Signed)
[facility] Provider Note   CSN: [id number] Arrival date & time: 04/19/20  1062     History Chief Complaint  Patient presents with  . Chest Pain    Haley Gonzales is a 49 y.o. female.  Patient presents with chest pain that has been intermittent all day. Pain is sharp and only lasts for a few seconds at a time. She hasn't identified anything that causes the pain, although it did seem to start after she had a panic attack yesterday.        Past Medical History:  Diagnosis Date  . Anemia   . Hypertension   . SVT (supraventricular tachycardia) (HCC)     There are no problems to display for this patient.   Past Surgical History:  Procedure Laterality Date  . CESAREAN SECTION       OB History   No obstetric history on file.     Family History  Problem Relation Age of Onset  . Hypertension Mother   . Lung cancer Father     Social History   Tobacco Use  . Smoking status: Former Research scientist (life sciences)  . Smokeless tobacco: Never Used  Vaping Use  . Vaping Use: Never used  Substance Use Topics  . Alcohol use: Yes    Comment: social  . Drug use: No    Home Medications Prior to Admission medications   Medication Sig Start Date End Date Taking? Authorizing Provider  amLODipine (NORVASC) 5 MG tablet Take 1 tablet by mouth once daily 03/16/20   Kroeger, Lorelee Cover., PA-C  atorvastatin (LIPITOR) 40 MG tablet Take 1 tablet (40 mg total) by mouth daily. 04/19/20   Vanessa Kick, MD  hydrOXYzine (ATARAX/VISTARIL) 25 MG tablet Take 1 tablet (25 mg total) by mouth every 8 (eight) hours as needed for anxiety. 09/07/19   Jaynee Eagles, PA-C  metoprolol succinate (TOPROL-XL) 50 MG 24 hr tablet TAKE 1 TABLET BY MOUTH ONCE DAILY -SCHEDULE  AN  APPT  FOR  FUTURE  REFILLS 04/17/20   Troy Sine, MD    Allergies    Patient has no known allergies.  Review of Systems   Review of Systems  Respiratory: Negative for shortness of breath.     Cardiovascular: Positive for chest pain.  All other systems reviewed and are negative.   Physical Exam Updated Vital Signs BP (!) 153/83   Pulse 62   Temp (!) 97.2 F (36.2 C) (Temporal)   Resp 16   SpO2 100%   Physical Exam Vitals and nursing note reviewed.  Constitutional:      General: She is not in acute distress.    Appearance: Normal appearance. She is well-developed.  HENT:     Head: Normocephalic and atraumatic.     Right Ear: Hearing normal.     Left Ear: Hearing normal.     Nose: Nose normal.  Eyes:     Conjunctiva/sclera: Conjunctivae normal.     Pupils: Pupils are equal, round, and reactive to light.  Cardiovascular:     Rate and Rhythm: Regular rhythm.     Heart sounds: S1 normal and S2 normal. No murmur heard.  No friction rub. No gallop.   Pulmonary:     Effort: Pulmonary effort is normal. No respiratory distress.     Breath sounds: Normal breath sounds.  Chest:     Chest wall: No tenderness.  Abdominal:     General: Bowel sounds are normal.     Palpations: Abdomen is soft.  Tenderness: There is no abdominal tenderness. There is no guarding or rebound. Negative signs include Murphy's sign and McBurney's sign.     Hernia: No hernia is present.  Musculoskeletal:        General: Normal range of motion.     Cervical back: Normal range of motion and neck supple.  Skin:    General: Skin is warm and dry.     Findings: No rash.  Neurological:     Mental Status: She is alert and oriented to person, place, and time.     GCS: GCS eye subscore is 4. GCS verbal subscore is 5. GCS motor subscore is 6.     Cranial Nerves: No cranial nerve deficit.     Sensory: No sensory deficit.     Coordination: Coordination normal.  Psychiatric:        Speech: Speech normal.        Behavior: Behavior normal.        Thought Content: Thought content normal.     ED Results / Procedures / Treatments   Labs (all labs ordered are listed, but only abnormal results are  displayed) Labs Reviewed  BASIC METABOLIC PANEL - Abnormal; Notable for the following components:      Result Value   Sodium 132 (*)    Glucose, Bld 107 (*)    All other components within normal limits  CBC - Abnormal; Notable for the following components:   Hemoglobin 10.0 (*)    HCT 34.4 (*)    MCH 24.4 (*)    MCHC 29.1 (*)    RDW 19.9 (*)    All other components within normal limits  I-STAT BETA HCG BLOOD, ED ([facility], [facility], [facility] ONLY)  TROPONIN I (HIGH SENSITIVITY)  TROPONIN I (HIGH SENSITIVITY)    EKG EKG Interpretation  Date/Time:  Wednesday April 19 2020 19:48:06 EST Ventricular Rate:  69 PR Interval:  138 QRS Duration: 72 QT Interval:  384 QTC Calculation: 411 R Axis:   27 Text Interpretation: Normal sinus rhythm Normal ECG Confirmed by Orpah Greek [phone]) on 04/19/2020 11:25:37 PM   Radiology DG Chest 2 View  Result Date: 04/19/2020 CLINICAL DATA:  Chest pain for 1 day. EXAM: CHEST - 2 VIEW COMPARISON:  08/13/2018 FINDINGS: The heart size and mediastinal contours are within normal limits. Both lungs are clear. The visualized skeletal structures are unremarkable. IMPRESSION: No active cardiopulmonary disease. Electronically Signed   By: Marlaine Hind M.D.   On: 04/19/2020 20:18    Procedures Procedures (including critical care time)  Medications Ordered in ED Medications - No data to display  ED Course  I have reviewed the triage vital signs and the nursing notes.  Pertinent labs & imaging results that were available during my care of the patient were reviewed by me and considered in my medical decision making (see chart for details).    MDM Rules/Calculators/A&P                          Patient presents to the emergency department for evaluation of chest pain.  Pain is extremely atypical in nature.  Patient experiencing intermittent sharp pains that only last a few seconds.  Cardiac evaluation is unremarkable.  Patient reassured, no further work-up  necessary.  Final Clinical Impression(s) / ED Diagnoses Final diagnoses:  Atypical chest pain  Hypertension, unspecified type    Rx / DC Orders ED Discharge Orders    None  Orpah Greek, MD 04/21/20 0700

## 2020-04-19 NOTE — ED Triage Notes (Signed)
Pt presents with chest pain that comes and goes. States had panic attack yesterday that awoke her from sleep. Denies numbness, dizziness, or headache.   States needs refill on atorvastatin.

## 2020-04-19 NOTE — Discharge Instructions (Addendum)
Please schedule follow up with primary care for recheck of your blood pressure and to address axiety/panic attack.

## 2020-04-22 NOTE — ED Provider Notes (Signed)
[facility]   [id number] 04/19/20 Arrival Time: 4944  ASSESSMENT & PLAN:  1. Chest pain, unspecified type   2. Panic attack   3. Hyperlipidemia, unspecified hyperlipidemia type     ECG: Performed today and interpreted by me: normal EKG, normal sinus rhythm. No STEMI. Reassuring; discussed with Haley Gonzales.  She feels symptoms just related to panic attack. No CP at this time. Precautions given.  Refilled at request: Meds ordered this encounter  Medications  . atorvastatin (LIPITOR) 40 MG tablet    Sig: Take 1 tablet (40 mg total) by mouth daily.    Dispense:  30 tablet    Refill:  1      Discharge Instructions     You have been seen at the [facility] today for chest pain. Your evaluation today was not suggestive of any emergent condition requiring medical intervention at this time. Your ECG (heart tracing) did not show any worrisome changes. However, some medical problems make take more time to appear. Therefore, it's very important that you pay attention to any new symptoms or worsening of your current condition.  Please proceed directly to the Emergency Department immediately should you feel worse in any way or have any of the following symptoms: increasing or different chest pain, pain that spreads to your arm, neck, jaw, back or abdomen, shortness of breath, or nausea and vomiting.      Reviewed expectations re: course of current medical issues. Questions answered. Outlined signs and symptoms indicating need for more acute intervention. Patient verbalized understanding. After Visit Summary given.   SUBJECTIVE:  History from: patient. Haley Gonzales is a 49 y.o. female who reports waking from sleep with a panic attack yesterday with associated worry and chest pain; lasted several minutes then slowly resolved. Few small and similar episodes since. No associated SOB/n/v Denies: dyspnea, exertional chest pressure/discomfort, fatigue, irregular  heart beat, lower extremity edema, orthopnea, palpitations, paroxysmal nocturnal dyspnea and syncope. Recent illnesses: none. Fever: absent. Ambulatory without assistance. Self/OTC treatment: none. History of similar: yes; infrequent. Illicit drug use: denied. Also needs refill of atorvastatin.  Social History   Tobacco Use  Smoking Status Former Smoker  Smokeless Tobacco Never Used   Social History   Substance and Sexual Activity  Alcohol Use Yes   Comment: social    OBJECTIVE:  Vitals:   04/19/20 1804  BP: (!) 145/75  Pulse: 83  Resp: 18  Temp: 99.2 F (37.3 C)  TempSrc: Oral  SpO2: 100%    General appearance: alert, oriented, no acute distress Eyes: PERRLA; EOMI; conjunctivae normal HENT: normocephalic; atraumatic Neck: supple with FROM Lungs: without labored respirations; speaks full sentences without difficulty; CTAB Heart: regular rate and rhythm Chest Wall: without tenderness to palpation Extremities: without edema; without calf swelling or tenderness; symmetrical without gross deformities Skin: warm and dry; without rash or lesions Neuro: normal gait Psychological: alert and cooperative; normal mood and affect   No Known Allergies  Past Medical History:  Diagnosis Date  . Anemia   . Hypertension   . SVT (supraventricular tachycardia) (HCC)    Social History   Socioeconomic History  . Marital status: Significant Other    Spouse name: Not on file  . Number of children: Not on file  . Years of education: Not on file  . Highest education level: Not on file  Occupational History  . Not on file  Tobacco Use  . Smoking status: Former Research scientist (life sciences)  . Smokeless tobacco: Never Used  Vaping  Use  . Vaping Use: Never used  Substance and Sexual Activity  . Alcohol use: Yes    Comment: social  . Drug use: No  . Sexual activity: Not on file  Other Topics Concern  . Not on file  Social History Narrative  . Not on file   Social Determinants of Health    Financial Resource Strain:   . Difficulty of Paying Living Expenses: Not on file  Food Insecurity:   . Worried About Charity fundraiser in the Last Year: Not on file  . Ran Out of Food in the Last Year: Not on file  Transportation Needs:   . Lack of Transportation (Medical): Not on file  . Lack of Transportation (Non-Medical): Not on file  Physical Activity:   . Days of Exercise per Week: Not on file  . Minutes of Exercise per Session: Not on file  Stress:   . Feeling of Stress : Not on file  Social Connections:   . Frequency of Communication with Friends and Family: Not on file  . Frequency of Social Gatherings with Friends and Family: Not on file  . Attends Religious Services: Not on file  . Active Member of Clubs or Organizations: Not on file  . Attends Archivist Meetings: Not on file  . Marital Status: Not on file  Intimate Partner Violence:   . Fear of Current or Ex-Partner: Not on file  . Emotionally Abused: Not on file  . Physically Abused: Not on file  . Sexually Abused: Not on file   Family History  Problem Relation Age of Onset  . Hypertension Mother   . Lung cancer Father    Past Surgical History:  Procedure Laterality Date  . CESAREAN SECTION       Vanessa Kick, MD 04/22/20 [phone]

## 2020-05-20 ENCOUNTER — Other Ambulatory Visit: Payer: Self-pay | Admitting: Cardiovascular Disease

## 2020-05-29 ENCOUNTER — Other Ambulatory Visit: Payer: Self-pay

## 2020-05-29 ENCOUNTER — Ambulatory Visit
Admission: EM | Admit: 2020-05-29 | Discharge: 2020-05-29 | Disposition: A | Discharge status: Home / Self Care | Payer: BC Managed Care – PPO | Attending: Emergency Medicine | Admitting: Emergency Medicine

## 2020-05-29 ENCOUNTER — Encounter: Payer: Self-pay | Admitting: Emergency Medicine

## 2020-05-29 DIAGNOSIS — N920 Excessive and frequent menstruation with regular cycle: Secondary | ICD-10-CM | POA: Diagnosis not present

## 2020-05-29 DIAGNOSIS — N946 Dysmenorrhea, unspecified: Secondary | ICD-10-CM

## 2020-05-29 LAB — POCT URINE PREGNANCY: Preg Test, Ur: NEGATIVE

## 2020-05-29 MED ORDER — MEGESTROL ACETATE 40 MG PO TABS: 40.0000 mg | ORAL_TABLET | Freq: Every day | ORAL | 0 refills | Status: DC

## 2020-05-29 MED ORDER — MELOXICAM 7.5 MG PO TABS: 7.5000 mg | ORAL_TABLET | Freq: Every day | ORAL | 0 refills | Status: DC

## 2020-05-29 NOTE — ED Triage Notes (Signed)
Patient states she has had extremely heavy vaginal bleeding and cramps she compares to labor pain. Pt states she can fill a pad in 10 minutes and have to change it. Pt is aox4 and ambulatory.

## 2020-05-29 NOTE — Discharge Instructions (Addendum)
Important to follow-up with GYN, PCP for further evaluation and management. Take Mobic: 1-2 tabs daily. Megace 1 tab daily x5 days. Go to ER for worsening bleeding, pain, lightheadedness, dizziness, palpitations, weakness.

## 2020-05-29 NOTE — ED Provider Notes (Signed)
[facility]    CSN: [id number] Arrival date & time: 05/29/20  1000      History   Chief Complaint Chief Complaint  Patient presents with  . Vaginal Bleeding    During menstrual cycle    HPI Haley Gonzales is a 49 y.o. female  With history as below presenting for heavy, painful menstrual bleeding.  Denies discharge, concern for pregnancy.  States last month uncle with similar.  No palpitations, weakness, malaise.  Denies personal or family history of endometriosis, fibroids.  Past Medical History:  Diagnosis Date  . Anemia   . Hypertension   . SVT (supraventricular tachycardia) (HCC)     There are no problems to display for this patient.   Past Surgical History:  Procedure Laterality Date  . CESAREAN SECTION      OB History   No obstetric history on file.      Home Medications    Prior to Admission medications   Medication Sig Start Date End Date Taking? Authorizing Provider  amLODipine (NORVASC) 5 MG tablet Take 1 tablet by mouth once daily 03/16/20  Yes Kroeger, Dot Lanes M., PA-C  atorvastatin (LIPITOR) 40 MG tablet Take 1 tablet (40 mg total) by mouth daily. 04/19/20  Yes Hagler, Arlys John, MD  megestrol (MEGACE) 40 MG tablet Take 1 tablet (40 mg total) by mouth daily. 05/29/20  Yes Hall-Potvin, [place], PA-C  meloxicam (MOBIC) 7.5 MG tablet Take 1 tablet (7.5 mg total) by mouth daily. 05/29/20  Yes Hall-Potvin, [place], PA-C  metoprolol succinate (TOPROL-XL) 50 MG 24 hr tablet TAKE 1 TABLET BY MOUTH ONCE DAILY -SCHEDULE  AN  APPT  FOR  FUTURE  REFILLS 05/22/20  Yes Lennette Bihari, MD  hydrOXYzine (ATARAX/VISTARIL) 25 MG tablet Take 1 tablet (25 mg total) by mouth every 8 (eight) hours as needed for anxiety. 09/07/19   Wallis Bamberg, PA-C    Family History Family History  Problem Relation Age of Onset  . Hypertension Mother   . Lung cancer Father     Social History Social History   Tobacco Use  . Smoking status: Former Games developer  . Smokeless  tobacco: Never Used  Vaping Use  . Vaping Use: Never used  Substance Use Topics  . Alcohol use: Yes    Comment: social  . Drug use: No     Allergies   Patient has no known allergies.   Review of Systems Review of Systems  Constitutional: Negative for fatigue and fever.  Respiratory: Negative for cough and shortness of breath.   Cardiovascular: Negative for chest pain and palpitations.  Gastrointestinal: Negative for constipation and diarrhea.  Genitourinary: Positive for menstrual problem. Negative for dysuria, flank pain, frequency, hematuria, pelvic pain, urgency, vaginal bleeding, vaginal discharge and vaginal pain.     Physical Exam Triage Vital Signs ED Triage Vitals  Enc Vitals Group     BP 05/29/20 1205 138/86     Pulse Rate 05/29/20 1205 79     Resp 05/29/20 1205 20     Temp 05/29/20 1205 98 F (36.7 C)     Temp Source 05/29/20 1205 Oral     SpO2 05/29/20 1205 97 %     Weight --      Height --      Head Circumference --      Peak Flow --      Pain Score 05/29/20 1223 6     Pain Loc --      Pain Edu? --  Excl. in GC? --    No data found.  Updated Vital Signs BP 138/86 (BP Location: Left Arm)   Pulse 79   Temp 98 F (36.7 C) (Oral)   Resp 20   SpO2 97%   Visual Acuity Right Eye Distance:   Left Eye Distance:   Bilateral Distance:    Right Eye Near:   Left Eye Near:    Bilateral Near:     Physical Exam Constitutional:      General: She is not in acute distress. HENT:     Head: Normocephalic and atraumatic.  Eyes:     General: No scleral icterus.    Conjunctiva/sclera: Conjunctivae normal.     Pupils: Pupils are equal, round, and reactive to light.  Cardiovascular:     Rate and Rhythm: Normal rate.  Pulmonary:     Effort: Pulmonary effort is normal.  Skin:    Coloration: Skin is not jaundiced or pale.  Neurological:     Mental Status: She is alert and oriented to person, place, and time.      UC Treatments / Results   Labs (all labs ordered are listed, but only abnormal results are displayed) Labs Reviewed  POCT URINE PREGNANCY    EKG   Radiology No results found.  Procedures Procedures (including critical care time)  Medications Ordered in UC Medications - No data to display  Initial Impression / Assessment and Plan / UC Course  I have reviewed the triage vital signs and the nursing notes.  Pertinent labs & imaging results that were available during my care of the patient were reviewed by me and considered in my medical decision making (see chart for details).     Patient afebrile, nontoxic, hemodynamically stable without tachypnea, tachycardia, conjunctival or soft palate pallor.  Urine pregnancy negative.  Low concern for active bleeding.  Will keep track of symptoms, start Megace, meloxicam, and follow-up with PCP/GYN for further evaluation management.  ER return precautions discussed, pt verbalized understanding and is agreeable to plan. Final Clinical Impressions(s) / UC Diagnoses   Final diagnoses:  Dysmenorrhea  Menorrhagia with regular cycle     Discharge Instructions     Important to follow-up with GYN, PCP for further evaluation and management. Take Mobic: 1-2 tabs daily. Megace 1 tab daily x5 days. Go to ER for worsening bleeding, pain, lightheadedness, dizziness, palpitations, weakness.    ED Prescriptions    Medication Sig Dispense Auth. Provider   megestrol (MEGACE) 40 MG tablet Take 1 tablet (40 mg total) by mouth daily. 5 tablet Hall-Potvin, [place], PA-C   meloxicam (MOBIC) 7.5 MG tablet Take 1 tablet (7.5 mg total) by mouth daily. 14 tablet Hall-Potvin, [place], PA-C     PDMP not reviewed this encounter.   Hall-Potvin, [place], [place] 05/29/20 1253

## 2020-06-21 ENCOUNTER — Other Ambulatory Visit: Payer: Self-pay | Admitting: Medical

## 2020-06-21 ENCOUNTER — Other Ambulatory Visit: Payer: Self-pay | Admitting: Cardiovascular Disease

## 2020-06-25 ENCOUNTER — Other Ambulatory Visit: Payer: Self-pay | Admitting: Medical

## 2020-06-26 NOTE — Telephone Encounter (Signed)
This is Dr. Kelly's pt. °

## 2020-07-10 ENCOUNTER — Other Ambulatory Visit: Payer: Self-pay | Admitting: Cardiovascular Disease

## 2020-07-10 ENCOUNTER — Other Ambulatory Visit: Payer: Self-pay | Admitting: Medical

## 2020-08-15 ENCOUNTER — Other Ambulatory Visit: Payer: Self-pay | Admitting: Cardiovascular Disease

## 2020-08-23 ENCOUNTER — Other Ambulatory Visit: Payer: Self-pay | Admitting: Cardiovascular Disease

## 2020-12-12 ENCOUNTER — Telehealth: Payer: Self-pay | Admitting: Hematology and Oncology

## 2020-12-12 NOTE — Telephone Encounter (Signed)
Received a new hem referral from [facility] for nutritional anemia. Haley Gonzales has been cld and scheduled to see Dr. Chryl Heck on 7/26 at 1:40pm. Pt aware to arrive 20 minutes early.

## 2020-12-22 ENCOUNTER — Other Ambulatory Visit: Payer: Self-pay | Admitting: Cardiovascular Disease

## 2020-12-22 NOTE — Telephone Encounter (Signed)
Refill sent 12/22/20

## 2020-12-26 ENCOUNTER — Inpatient Hospital Stay: Payer: BC Managed Care – PPO

## 2020-12-26 ENCOUNTER — Inpatient Hospital Stay: Payer: BC Managed Care – PPO | Attending: Hematology and Oncology | Admitting: Hematology and Oncology

## 2020-12-26 ENCOUNTER — Telehealth: Payer: Self-pay

## 2020-12-26 NOTE — Telephone Encounter (Signed)
Attempted to call patient at primary number. No answer.  LVM with number to clinic and requested patient to call back.

## 2021-01-08 ENCOUNTER — Other Ambulatory Visit: Payer: Self-pay

## 2021-01-08 ENCOUNTER — Emergency Department (HOSPITAL_COMMUNITY)
Admission: EM | Admit: 2021-01-08 | Discharge: 2021-01-09 | Disposition: A | Discharge status: Home / Self Care | Payer: BC Managed Care – PPO | Attending: Emergency Medicine | Admitting: Emergency Medicine

## 2021-01-08 ENCOUNTER — Other Ambulatory Visit: Payer: Self-pay | Admitting: Cardiovascular Disease

## 2021-01-08 DIAGNOSIS — F410 Panic disorder [episodic paroxysmal anxiety] without agoraphobia: Secondary | ICD-10-CM | POA: Insufficient documentation

## 2021-01-08 DIAGNOSIS — Z87891 Personal history of nicotine dependence: Secondary | ICD-10-CM | POA: Insufficient documentation

## 2021-01-08 DIAGNOSIS — I10 Essential (primary) hypertension: Secondary | ICD-10-CM | POA: Insufficient documentation

## 2021-01-08 DIAGNOSIS — Z79899 Other long term (current) drug therapy: Secondary | ICD-10-CM | POA: Insufficient documentation

## 2021-01-08 MED ORDER — METOPROLOL SUCCINATE ER 50 MG PO TB24: ORAL_TABLET | ORAL | 0 refills | Status: DC

## 2021-01-08 NOTE — ED Triage Notes (Signed)
Pt states she has anxiety/panic attacks and takes metoprolol for these.  Took her last one tonight and feels fine now but is worried to go home without another prescription in case the anxiety returns.

## 2021-01-08 NOTE — ED Provider Notes (Addendum)
Emergency Medicine Provider Triage Evaluation Note  Nadine Tenenbaum , a 51 y.o. female  was evaluated in triage.  Pt complains of a panic attack that woke her up 1 hr ago. SOB and heart racing fast. Has run out of metoprolol for her BP. Missed 4 days, took last dose immediately PTA. 105hr on eval.  Review of Systems  Positive: SOB, palpitations Negative: Pain, NV, dizziness and syncope  Physical Exam  BP (!) 139/99 (BP Location: Left Arm)   Pulse (!) 101   Temp 98.2 F (36.8 C) (Oral)   Resp 18   SpO2 98%  Gen:   Awake, no distress   Resp:  Normal effort Cardio:  Tachy, regular rhythm.  MSK:   Moves extremities without difficulty  Other:    Medical Decision Making  Medically screening exam initiated at 11:26 PM.  Appropriate orders placed.  Laramie Vanwormer was informed that the remainder of the evaluation will be completed by another provider, this initial triage assessment does not replace that evaluation, and the importance of remaining in the ED until their evaluation is complete.   Hesitant but understanding of return to waiting room   Antwyne Pingree, Cecilio Asper, PA-C 01/08/21 2340    Rhae Hammock, PA-C 01/08/21 2357    Isla Pence, MD 01/09/21 1736

## 2021-01-09 NOTE — Discharge Instructions (Addendum)
You were seen today for panic attacks.  Your prescription was refilled.  You should only take this once a day.  This is a long-acting beta-blocker and is not meant for as needed dosing.  Follow-up with your primary doctor.

## 2021-01-09 NOTE — ED Provider Notes (Signed)
[facility] EMERGENCY DEPARTMENT Provider Note   CSN: [id number] Arrival date & time: 01/08/21  2249     History No chief complaint on file.   Haley Gonzales is a 50 y.o. female.  HPI     This a 50 year old female who presents with panic attack.  Patient reports that she had a panic attack while she was at home.  She took her metoprolol.  She is supposed to be taking metoprolol daily but states she was running out of her prescription and only had 1 pill left.  She had not taken it in 4 days.  She reports her panic attack symptoms were consistent with prior including racing heart and anxiety.  She is currently asymptomatic.  She reports that she was concerned to go home without a prescription.  Past Medical History:  Diagnosis Date   Anemia    Hypertension    SVT (supraventricular tachycardia) ([place])     There are no problems to display for this patient.   Past Surgical History:  Procedure Laterality Date   CESAREAN SECTION       OB History   No obstetric history on file.     Family History  Problem Relation Age of Onset   Hypertension Mother    Lung cancer Father     Social History   Tobacco Use   Smoking status: Former   Smokeless tobacco: Never  Scientific laboratory technician Use: Never used  Substance Use Topics   Alcohol use: Yes    Comment: social   Drug use: No    Home Medications Prior to Admission medications   Medication Sig Start Date End Date Taking? Authorizing Provider  amLODipine (NORVASC) 5 MG tablet TAKE 1 TABLET BY MOUTH ONCE DAILY . APPOINTMENT REQUIRED FOR FUTURE REFILLS 12/22/20   Troy Sine, MD  atorvastatin (LIPITOR) 40 MG tablet Take 1 tablet (40 mg total) by mouth daily. 04/19/20   Vanessa Kick, MD  hydrOXYzine (ATARAX/VISTARIL) 25 MG tablet Take 1 tablet (25 mg total) by mouth every 8 (eight) hours as needed for anxiety. 09/07/19   Jaynee Eagles, PA-C  megestrol (MEGACE) 40 MG tablet Take 1 tablet (40 mg total) by  mouth daily. 05/29/20   Hall-Potvin, [place], PA-C  meloxicam (MOBIC) 7.5 MG tablet Take 1 tablet (7.5 mg total) by mouth daily. 05/29/20   Hall-Potvin, [place], PA-C  metoprolol succinate (TOPROL-XL) 50 MG 24 hr tablet TAKE 1 TABLET BY MOUTH ONCE DAILY *SCHEDULE  AN  APPOINTMENT  FOR  FUTURE  REFILLS 01/08/21   Redwine, Madison A, PA-C    Allergies    Patient has no known allergies.  Review of Systems   Review of Systems  Constitutional:  Negative for fever.  Respiratory:  Negative for shortness of breath.   Cardiovascular:  Positive for palpitations.  Psychiatric/Behavioral:  The patient is nervous/anxious.   All other systems reviewed and are negative.  Physical Exam Updated Vital Signs BP (!) 138/94 (BP Location: Left Arm)   Pulse 78   Temp (!) 97.4 F (36.3 C) (Temporal)   Resp 16   Ht 1.549 m ('5\' 1"'$ )   Wt 77.1 kg   SpO2 100%   BMI 32.12 kg/m   Physical Exam Vitals and nursing note reviewed.  Constitutional:      Appearance: She is well-developed. She is not ill-appearing.  HENT:     Head: Normocephalic and atraumatic.     Mouth/Throat:     Mouth: Mucous membranes are moist.  Eyes:     Pupils: Pupils are equal, round, and reactive to light.  Cardiovascular:     Rate and Rhythm: Normal rate and regular rhythm.     Heart sounds: Normal heart sounds.  Pulmonary:     Effort: Pulmonary effort is normal. No respiratory distress.     Breath sounds: No wheezing.  Abdominal:     General: Bowel sounds are normal.     Palpations: Abdomen is soft.  Musculoskeletal:     Cervical back: Neck supple.  Skin:    General: Skin is warm and dry.  Neurological:     Mental Status: She is alert and oriented to person, place, and time.  Psychiatric:        Mood and Affect: Mood normal.    ED Results / Procedures / Treatments   Labs (all labs ordered are listed, but only abnormal results are displayed) Labs Reviewed - No data to display  EKG EKG  Interpretation  Date/Time:  Monday January 08 2021 23:44:39 EDT Ventricular Rate:  101 PR Interval:  154 QRS Duration: 66 QT Interval:  356 QTC Calculation: 461 R Axis:   11 Text Interpretation: Sinus tachycardia Minimal voltage criteria for LVH, may be normal variant ( R in aVL ) Inferior infarct , age undetermined Anterolateral infarct , age undetermined Abnormal ECG When compared with ECG of 04/19/2020, No significant change was found Confirmed by Delora Fuel ([id number]) on 01/09/2021 1:17:24 AM  Radiology No results found.  Procedures Procedures   Medications Ordered in ED Medications - No data to display  ED Course  I have reviewed the triage vital signs and the nursing notes.  Pertinent labs & imaging results that were available during my care of the patient were reviewed by me and considered in my medical decision making (see chart for details).    MDM Rules/Calculators/A&P                           Patient presents after having an anxiety attack.  Reports she took her last metoprolol which she takes when she has anxiety.  She is requesting a refill.  She is currently asymptomatic and back to her baseline.  EKG slight tachycardia but no arrhythmia.  She is asymptomatic.  Patient does not require any further work-up.  Feel that symptoms are characteristic of her prior anxiety.  I did review her chart.  She was previously on metoprolol XL.  She does report that she occasionally will also take it as needed.  She was instructed not to take this as needed but daily given that it is long-acting.  We will refill limited prescription and have her follow-up with primary physician.  After history, exam, and medical workup I feel the patient has been appropriately medically screened and is safe for discharge home. Pertinent diagnoses were discussed with the patient. Patient was given return precautions.  Final Clinical Impression(s) / ED Diagnoses Final diagnoses:  Panic attacks    Rx /  DC Orders ED Discharge Orders          Ordered    metoprolol succinate (TOPROL-XL) 50 MG 24 hr tablet        01/08/21 2355             Jaray Boliver, Barbette Hair, MD 01/09/21 [phone]

## 2021-01-10 ENCOUNTER — Other Ambulatory Visit: Payer: Self-pay | Admitting: Cardiovascular Disease

## 2021-01-29 ENCOUNTER — Other Ambulatory Visit: Payer: Self-pay | Admitting: Cardiovascular Disease

## 2021-01-30 ENCOUNTER — Telehealth: Payer: Self-pay | Admitting: Hematology and Oncology

## 2021-01-30 NOTE — Telephone Encounter (Signed)
Pt called in to r/s missed appt with Dr. Chryl Heck. R/s appt, pt aware.

## 2021-02-09 ENCOUNTER — Other Ambulatory Visit: Payer: Self-pay | Admitting: Cardiovascular Disease

## 2021-02-13 ENCOUNTER — Inpatient Hospital Stay: Payer: BC Managed Care – PPO | Attending: Hematology and Oncology | Admitting: Hematology and Oncology

## 2021-02-13 ENCOUNTER — Encounter: Payer: Self-pay | Admitting: Hematology and Oncology

## 2021-02-13 ENCOUNTER — Other Ambulatory Visit: Payer: Self-pay

## 2021-02-13 ENCOUNTER — Inpatient Hospital Stay: Payer: BC Managed Care – PPO

## 2021-02-13 DIAGNOSIS — D509 Iron deficiency anemia, unspecified: Secondary | ICD-10-CM | POA: Insufficient documentation

## 2021-02-13 DIAGNOSIS — N920 Excessive and frequent menstruation with regular cycle: Secondary | ICD-10-CM | POA: Insufficient documentation

## 2021-02-13 DIAGNOSIS — Z87891 Personal history of nicotine dependence: Secondary | ICD-10-CM | POA: Diagnosis not present

## 2021-02-13 DIAGNOSIS — Z800 Family history of malignant neoplasm of digestive organs: Secondary | ICD-10-CM | POA: Insufficient documentation

## 2021-02-13 DIAGNOSIS — D500 Iron deficiency anemia secondary to blood loss (chronic): Secondary | ICD-10-CM

## 2021-02-13 NOTE — Progress Notes (Signed)
[facility] CONSULT NOTE  Patient Care Team: Patient, No Pcp Per (Inactive) as PCP - General (General Practice) Troy Sine, MD as PCP - Cardiology (Cardiology)  CHIEF COMPLAINTS/PURPOSE OF CONSULTATION:  IDA  ASSESSMENT & PLAN:   This is a very pleasant 50 year old female patient with iron deficiency anemia referred to hematology for additional recommendations.  We have discussed the following details about iron deficiency anemia.  Iron deficiency anemia affects a large proportion of the wellness population especially females of childbearing age and children.  Common causes of iron deficiency include blood loss, reduced iron absorption because of previous surgeries, dietary restrictions or other malabsorption issues, medications that reduce gastric acidity are due to inherited disorders such as IRIDA due to TMPRSS6 mutation. Symptoms of iron deficiency usually include fatigue, pica, restless legs, exercise intolerance, exertional dyspnea, headaches and weakness. Diagnosis can be usually made by history, physical examination, CBC and iron studies.  We generally treat iron deficiency anemia with oral supplements if this can be tolerated.  IV iron is appropriate for patients are unable to tolerate due to gastrointestinal side effects or for patients with severe/ongoing blood loss, history of gastric bypass which reduces gastric acid and henceforth will impair intestinal absorption of oral iron, malabsorption syndromes are occasional in pregnancy.  Given severe iron deficiency anemia with hemoglobin of 8 g/dL, I think it is reasonable to attempt intravenous iron first. There are several formularies of intravenous iron available in the market.  We have discussed about risk of allergic/infusion reactions including potentially life-threatening anaphylaxis with intravenous iron however these serious allergic reactions are exceedingly rare and overestimated.  In contrast to serious  allergic reactions, IV iron may be associated with nonallergic infusion reactions including self-limited urticaria, palpitations, dizziness, neck and back spasm which again occur in less than 1% of the individuals and do not progress to more serious reactions.  Venofer therapy plan placed. Age-appropriate cancer screening recommended. Return to clinic in 3 months with repeat labs.  HISTORY OF PRESENTING ILLNESS:  Haley Gonzales 50 y.o. female is here because of anemia.  Haley Gonzales is here for an initial visit by herself.  She was noticed to have severe iron deficiency anemia and hence referred to hematology.  About 10 years ago, she needed iron infusion for iron deficiency anemia.  She complains of very heavy menstrual cycles at baseline. She describes first 3 days as very very heavy, she has to change tampon/napkin almost every hr. Menstrual cycles are regular. They have become heavier in the last yr. She feels very tired, sluggish with iron deficiency anemia. No SOB on exertion. PICA present, eating edible chalk for years.  No brittle nails, trouble swallowing. No hematochezia or melena. No family history of colon cancer. Brother passed away from stomach cancer in his 11's. She tried oral iron supplementation for a couple days, couldn't tolerate them, they make her feel bad.  She was hoping to get intravenous iron supplementation. Rest of the pertinent 10 point ROS reviewed and negative  REVIEW OF SYSTEMS:   Constitutional: Denies fevers, chills or abnormal night sweats Eyes: Denies blurriness of vision, double vision or watery eyes Ears, nose, mouth, throat, and face: Denies mucositis or sore throat Respiratory: Denies cough, dyspnea or wheezes Cardiovascular: Denies palpitation, chest discomfort or lower extremity swelling Gastrointestinal:  Denies nausea, heartburn or change in bowel habits Skin: Denies abnormal skin rashes Lymphatics: Denies new lymphadenopathy or easy  bruising Neurological:Denies numbness, tingling or new weaknesses Behavioral/Psych: Mood is stable, no  new changes  All other systems were reviewed with the patient and are negative.  MEDICAL HISTORY:  Past Medical History:  Diagnosis Date   Anemia    Hypertension    SVT (supraventricular tachycardia) ([place])     SURGICAL HISTORY: Past Surgical History:  Procedure Laterality Date   CESAREAN SECTION      SOCIAL HISTORY: Social History   Socioeconomic History   Marital status: Significant Other    Spouse name: Not on file   Number of children: Not on file   Years of education: Not on file   Highest education level: Not on file  Occupational History   Not on file  Tobacco Use   Smoking status: Former   Smokeless tobacco: Never  Vaping Use   Vaping Use: Never used  Substance and Sexual Activity   Alcohol use: Yes    Comment: social   Drug use: No   Sexual activity: Yes  Other Topics Concern   Not on file  Social History Narrative   Not on file   Social Determinants of Health   Financial Resource Strain: Not on file  Food Insecurity: Not on file  Transportation Needs: Not on file  Physical Activity: Not on file  Stress: Not on file  Social Connections: Not on file  Intimate Partner Violence: Not on file    FAMILY HISTORY: Family History  Problem Relation Age of Onset   Hypertension Mother    Lung cancer Father     ALLERGIES:  has No Known Allergies.  MEDICATIONS:  Current Outpatient Medications  Medication Sig Dispense Refill   amLODipine (NORVASC) 5 MG tablet TAKE 1 TABLET BY MOUTH ONCE DAILY . APPOINTMENT REQUIRED FOR FUTURE REFILLS 7 tablet 0   atorvastatin (LIPITOR) 40 MG tablet Take 1 tablet (40 mg total) by mouth daily. 30 tablet 1   metoprolol succinate (TOPROL-XL) 50 MG 24 hr tablet TAKE 1 TABLET BY MOUTH ONCE DAILY *SCHEDULE  AN  APPOINTMENT  FOR  FUTURE  REFILLS 30 tablet 0   No current facility-administered medications for this visit.     PHYSICAL EXAMINATION: ECOG PERFORMANCE STATUS: 0 - Asymptomatic  Vitals:   02/13/21 1423  BP: (!) 170/95  Pulse: 82  Resp: 18  Temp: 98.1 F (36.7 C)  SpO2: 100%   Filed Weights   02/13/21 1423  Weight: 176 lb 3.2 oz (79.9 kg)    GENERAL:alert, no distress and comfortable SKIN: skin color, texture, turgor are normal, no rashes or significant lesions EYES: normal, conjunctiva are pink and non-injected, sclera clear OROPHARYNX:no exudate, no erythema and lips, buccal mucosa, and tongue normal  NECK: supple, thyroid normal size, non-tender, without nodularity LYMPH:  no palpable lymphadenopathy in the cervical, axillary  LUNGS: clear to auscultation and percussion with normal breathing effort HEART: regular rate & rhythm and no murmurs and no lower extremity edema ABDOMEN:abdomen soft, non-tender and normal bowel sounds Musculoskeletal:no cyanosis of digits and no clubbing  PSYCH: alert & oriented x 3 with fluent speech NEURO: no focal motor/sensory deficits  LABORATORY DATA:  I have reviewed the data as listed Lab Results  Component Value Date   WBC 6.2 04/19/2020   HGB 10.0 (L) 04/19/2020   HCT 34.4 (L) 04/19/2020   MCV 83.9 04/19/2020   PLT 381 04/19/2020     Chemistry      Component Value Date/Time   NA 132 (L) 04/19/2020 1956   NA 137 07/22/2017 0949   K 3.6 04/19/2020 1956   CL 104 04/19/2020  1956   CO2 23 04/19/2020 1956   BUN 8 04/19/2020 1956   BUN 7 07/22/2017 0949   CREATININE 0.85 04/19/2020 1956      Component Value Date/Time   CALCIUM 8.9 04/19/2020 1956   ALKPHOS 67 07/22/2017 0949   AST 13 07/22/2017 0949   ALT 9 07/22/2017 0949   BILITOT <0.2 07/22/2017 0949     CBC from November 24, 2020 showed a white count of 6100, hemoglobin of 8 g/dL, platelet count 557,000. CMP showed a sodium of 134, potassium 4.4, BUN of 6, creatinine of 0.8.  Ferritin was 3.9 Free T4 was normal.  B12 was 541, folate is 11.33, TIBC of 470 Total protein of 7.3 g/dL,  albumin of 4.0 g/dL, alkaline phosphatase is normal at 82 international units/L Hemoglobin A1c 6% vitamin D17.7, low TSH normal at 1.076  RADIOGRAPHIC STUDIES: I have personally reviewed the radiological images as listed and agreed with the findings in the report. No results found.  All questions were answered. The patient knows to call the clinic with any problems, questions or concerns. I spent 45 minutes in the care of this patient including H and P, review of records, counseling and coordination of care.     Benay Pike, MD 02/13/2021 2:44 PM

## 2021-02-15 ENCOUNTER — Telehealth: Payer: Self-pay | Admitting: Hematology and Oncology

## 2021-02-15 NOTE — Telephone Encounter (Signed)
Patient has been called and notified of all upcoming appointments.

## 2021-02-20 ENCOUNTER — Inpatient Hospital Stay: Payer: BC Managed Care – PPO

## 2021-02-20 ENCOUNTER — Other Ambulatory Visit: Payer: Self-pay

## 2021-02-20 VITALS — BP 124/73 | HR 69 | Temp 98.4°F | Resp 18

## 2021-02-20 DIAGNOSIS — D509 Iron deficiency anemia, unspecified: Secondary | ICD-10-CM | POA: Diagnosis not present

## 2021-02-20 DIAGNOSIS — D500 Iron deficiency anemia secondary to blood loss (chronic): Secondary | ICD-10-CM

## 2021-02-20 MED ORDER — SODIUM CHLORIDE 0.9 % IV SOLN
200.0000 mg | Freq: Once | INTRAVENOUS | Status: AC
  Administered 2021-02-20: 200 mg via INTRAVENOUS
  Filled 2021-02-20: qty 10

## 2021-02-20 MED ORDER — SODIUM CHLORIDE 0.9 % IV SOLN: Freq: Once | INTRAVENOUS | Status: AC

## 2021-02-23 ENCOUNTER — Inpatient Hospital Stay: Payer: BC Managed Care – PPO

## 2021-02-23 ENCOUNTER — Other Ambulatory Visit: Payer: Self-pay

## 2021-02-23 VITALS — BP 133/67 | HR 73 | Temp 98.7°F | Resp 17

## 2021-02-23 DIAGNOSIS — D500 Iron deficiency anemia secondary to blood loss (chronic): Secondary | ICD-10-CM

## 2021-02-23 DIAGNOSIS — D509 Iron deficiency anemia, unspecified: Secondary | ICD-10-CM | POA: Diagnosis not present

## 2021-02-23 MED ORDER — SODIUM CHLORIDE 0.9 % IV SOLN
Freq: Once | INTRAVENOUS | Status: AC
Start: 2021-02-23 — End: 2021-02-23

## 2021-02-23 MED ORDER — SODIUM CHLORIDE 0.9 % IV SOLN
200.0000 mg | Freq: Once | INTRAVENOUS | Status: AC
  Administered 2021-02-23: 200 mg via INTRAVENOUS
  Filled 2021-02-23: qty 200

## 2021-02-23 NOTE — Patient Instructions (Signed)

## 2021-02-27 ENCOUNTER — Other Ambulatory Visit: Payer: Self-pay

## 2021-02-27 ENCOUNTER — Inpatient Hospital Stay: Payer: BC Managed Care – PPO

## 2021-02-27 VITALS — BP 144/77 | HR 61 | Temp 98.3°F | Resp 16

## 2021-02-27 DIAGNOSIS — D500 Iron deficiency anemia secondary to blood loss (chronic): Secondary | ICD-10-CM

## 2021-02-27 DIAGNOSIS — D509 Iron deficiency anemia, unspecified: Secondary | ICD-10-CM | POA: Diagnosis not present

## 2021-02-27 MED ORDER — SODIUM CHLORIDE 0.9 % IV SOLN: Freq: Once | INTRAVENOUS | Status: AC

## 2021-02-27 MED ORDER — SODIUM CHLORIDE 0.9 % IV SOLN
200.0000 mg | Freq: Once | INTRAVENOUS | Status: AC
  Administered 2021-02-27: 200 mg via INTRAVENOUS
  Filled 2021-02-27: qty 200

## 2021-02-27 NOTE — Patient Instructions (Signed)

## 2021-03-01 ENCOUNTER — Telehealth: Payer: Self-pay | Admitting: Hematology and Oncology

## 2021-03-01 NOTE — Telephone Encounter (Signed)
Scheduled appointment per provider. Patient is aware. 

## 2021-03-02 ENCOUNTER — Ambulatory Visit: Payer: BC Managed Care – PPO

## 2021-03-02 ENCOUNTER — Inpatient Hospital Stay: Payer: BC Managed Care – PPO

## 2021-03-02 ENCOUNTER — Other Ambulatory Visit: Payer: Self-pay

## 2021-03-02 VITALS — BP 113/81 | HR 66 | Temp 98.6°F | Resp 16

## 2021-03-02 DIAGNOSIS — D500 Iron deficiency anemia secondary to blood loss (chronic): Secondary | ICD-10-CM

## 2021-03-02 DIAGNOSIS — D509 Iron deficiency anemia, unspecified: Secondary | ICD-10-CM | POA: Diagnosis not present

## 2021-03-02 MED ORDER — SODIUM CHLORIDE 0.9 % IV SOLN: Freq: Once | INTRAVENOUS | Status: AC

## 2021-03-02 MED ORDER — SODIUM CHLORIDE 0.9 % IV SOLN
200.0000 mg | Freq: Once | INTRAVENOUS | Status: AC
  Administered 2021-03-02: 200 mg via INTRAVENOUS
  Filled 2021-03-02: qty 200

## 2021-03-06 ENCOUNTER — Inpatient Hospital Stay: Payer: BC Managed Care – PPO | Attending: Hematology and Oncology

## 2021-03-06 DIAGNOSIS — D509 Iron deficiency anemia, unspecified: Secondary | ICD-10-CM | POA: Insufficient documentation

## 2021-03-13 ENCOUNTER — Ambulatory Visit: Payer: BC Managed Care – PPO

## 2021-03-13 ENCOUNTER — Inpatient Hospital Stay: Payer: BC Managed Care – PPO

## 2021-03-13 ENCOUNTER — Other Ambulatory Visit: Payer: Self-pay

## 2021-03-13 VITALS — BP 147/82 | HR 68 | Temp 98.8°F | Resp 16

## 2021-03-13 DIAGNOSIS — D500 Iron deficiency anemia secondary to blood loss (chronic): Secondary | ICD-10-CM

## 2021-03-13 DIAGNOSIS — D509 Iron deficiency anemia, unspecified: Secondary | ICD-10-CM | POA: Diagnosis not present

## 2021-03-13 MED ORDER — SODIUM CHLORIDE 0.9 % IV SOLN: Freq: Once | INTRAVENOUS | Status: AC

## 2021-03-13 MED ORDER — SODIUM CHLORIDE 0.9 % IV SOLN
200.0000 mg | Freq: Once | INTRAVENOUS | Status: AC
  Administered 2021-03-13: 200 mg via INTRAVENOUS
  Filled 2021-03-13: qty 200

## 2021-03-13 NOTE — Patient Instructions (Signed)

## 2021-03-13 NOTE — Progress Notes (Signed)
Patient was observed for 30 minutes post iron infusion with no adverse reactions. Vitals stable and patient in no distress upon leaving infusion room. Patient was advised to take some tylenol and stay hydrated for her mild headache, she verbalized understanding.

## 2021-05-08 ENCOUNTER — Telehealth: Payer: Self-pay | Admitting: Hematology and Oncology

## 2021-05-08 NOTE — Telephone Encounter (Signed)
Rescheduled appointment per provider. Patient is aware. 

## 2021-05-15 ENCOUNTER — Other Ambulatory Visit: Payer: BC Managed Care – PPO

## 2021-05-15 ENCOUNTER — Ambulatory Visit: Payer: BC Managed Care – PPO | Admitting: Hematology and Oncology

## 2021-05-21 ENCOUNTER — Other Ambulatory Visit: Payer: Self-pay

## 2021-05-21 DIAGNOSIS — D500 Iron deficiency anemia secondary to blood loss (chronic): Secondary | ICD-10-CM

## 2021-05-22 ENCOUNTER — Inpatient Hospital Stay: Payer: BC Managed Care – PPO | Attending: Hematology and Oncology

## 2021-05-22 ENCOUNTER — Inpatient Hospital Stay: Payer: BC Managed Care – PPO | Admitting: Hematology and Oncology

## 2021-05-22 NOTE — Progress Notes (Incomplete)
[facility] CONSULT NOTE  Patient Care Team: Patient, No Pcp Per (Inactive) as PCP - General (General Practice) Troy Sine, MD as PCP - Cardiology (Cardiology)  CHIEF COMPLAINTS/PURPOSE OF CONSULTATION:  IDA  ASSESSMENT & PLAN:   This is a very pleasant 50 year old female patient with iron deficiency anemia referred to hematology for additional recommendations.      HISTORY OF PRESENTING ILLNESS:  Haley Gonzales 50 y.o. female is here because of anemia.  Haley Gonzales is here for an initial visit by herself.  She was noticed to have severe iron deficiency anemia from menorrhagia.  During her initial visit, we have discussed about proceeding with IV iron supplementation.  She is here for follow-up.   Brother passed away from stomach cancer in his 72's.  Rest of the pertinent 10 point ROS reviewed and negative  REVIEW OF SYSTEMS:   Constitutional: Denies fevers, chills or abnormal night sweats Eyes: Denies blurriness of vision, double vision or watery eyes Ears, nose, mouth, throat, and face: Denies mucositis or sore throat Respiratory: Denies cough, dyspnea or wheezes Cardiovascular: Denies palpitation, chest discomfort or lower extremity swelling Gastrointestinal:  Denies nausea, heartburn or change in bowel habits Skin: Denies abnormal skin rashes Lymphatics: Denies new lymphadenopathy or easy bruising Neurological:Denies numbness, tingling or new weaknesses Behavioral/Psych: Mood is stable, no new changes  All other systems were reviewed with the patient and are negative.  MEDICAL HISTORY:  Past Medical History:  Diagnosis Date   Anemia    Hypertension    SVT (supraventricular tachycardia) ([place])     SURGICAL HISTORY: Past Surgical History:  Procedure Laterality Date   CESAREAN SECTION      SOCIAL HISTORY: Social History   Socioeconomic History   Marital status: Significant Other    Spouse name: Not on file   Number of children: Not on file    Years of education: Not on file   Highest education level: Not on file  Occupational History   Not on file  Tobacco Use   Smoking status: Former   Smokeless tobacco: Never  Vaping Use   Vaping Use: Never used  Substance and Sexual Activity   Alcohol use: Yes    Comment: social   Drug use: No   Sexual activity: Yes  Other Topics Concern   Not on file  Social History Narrative   Not on file   Social Determinants of Health   Financial Resource Strain: Not on file  Food Insecurity: Not on file  Transportation Needs: Not on file  Physical Activity: Not on file  Stress: Not on file  Social Connections: Not on file  Intimate Partner Violence: Not on file    FAMILY HISTORY: Family History  Problem Relation Age of Onset   Hypertension Mother    Lung cancer Father     ALLERGIES:  has No Known Allergies.  MEDICATIONS:  Current Outpatient Medications  Medication Sig Dispense Refill   amLODipine (NORVASC) 5 MG tablet TAKE 1 TABLET BY MOUTH ONCE DAILY . APPOINTMENT REQUIRED FOR FUTURE REFILLS 7 tablet 0   atorvastatin (LIPITOR) 40 MG tablet Take 1 tablet (40 mg total) by mouth daily. 30 tablet 1   metoprolol succinate (TOPROL-XL) 50 MG 24 hr tablet TAKE 1 TABLET BY MOUTH ONCE DAILY *SCHEDULE  AN  APPOINTMENT  FOR  FUTURE  REFILLS 30 tablet 0   Vitamin D, Ergocalciferol, (DRISDOL) 1.25 MG (50000 UNIT) CAPS capsule Take 50,000 Units by mouth once a week.  No current facility-administered medications for this visit.    PHYSICAL EXAMINATION: ECOG PERFORMANCE STATUS: 0 - Asymptomatic  There were no vitals filed for this visit.  There were no vitals filed for this visit.   GENERAL:alert, no distress and comfortable SKIN: skin color, texture, turgor are normal, no rashes or significant lesions EYES: normal, conjunctiva are pink and non-injected, sclera clear OROPHARYNX:no exudate, no erythema and lips, buccal mucosa, and tongue normal  NECK: supple, thyroid normal size,  non-tender, without nodularity LYMPH:  no palpable lymphadenopathy in the cervical, axillary  LUNGS: clear to auscultation and percussion with normal breathing effort HEART: regular rate & rhythm and no murmurs and no lower extremity edema ABDOMEN:abdomen soft, non-tender and normal bowel sounds Musculoskeletal:no cyanosis of digits and no clubbing  PSYCH: alert & oriented x 3 with fluent speech NEURO: no focal motor/sensory deficits  LABORATORY DATA:  I have reviewed the data as listed Lab Results  Component Value Date   WBC 6.2 04/19/2020   HGB 10.0 (L) 04/19/2020   HCT 34.4 (L) 04/19/2020   MCV 83.9 04/19/2020   PLT 381 04/19/2020     Chemistry      Component Value Date/Time   NA 132 (L) 04/19/2020 1956   NA 137 07/22/2017 0949   K 3.6 04/19/2020 1956   CL 104 04/19/2020 1956   CO2 23 04/19/2020 1956   BUN 8 04/19/2020 1956   BUN 7 07/22/2017 0949   CREATININE 0.85 04/19/2020 1956      Component Value Date/Time   CALCIUM 8.9 04/19/2020 1956   ALKPHOS 67 07/22/2017 0949   AST 13 07/22/2017 0949   ALT 9 07/22/2017 0949   BILITOT <0.2 07/22/2017 0949     CBC from November 24, 2020 showed a white count of 6100, hemoglobin of 8 g/dL, platelet count 557,000. CMP showed a sodium of 134, potassium 4.4, BUN of 6, creatinine of 0.8.  Ferritin was 3.9 Free T4 was normal.  B12 was 541, folate is 11.33, TIBC of 470 Total protein of 7.3 g/dL, albumin of 4.0 g/dL, alkaline phosphatase is normal at 82 international units/L Hemoglobin A1c 6% vitamin D17.7, low TSH normal at 1.076  RADIOGRAPHIC STUDIES: I have personally reviewed the radiological images as listed and agreed with the findings in the report. No results found.  All questions were answered. The patient knows to call the clinic with any problems, questions or concerns. I spent 45 minutes in the care of this patient including H and P, review of records, counseling and coordination of care.     Haley Pike,  MD 05/22/2021 1:57 PM

## 2021-06-25 ENCOUNTER — Other Ambulatory Visit: Payer: Self-pay | Admitting: *Deleted

## 2021-06-25 DIAGNOSIS — D500 Iron deficiency anemia secondary to blood loss (chronic): Secondary | ICD-10-CM

## 2021-06-26 ENCOUNTER — Inpatient Hospital Stay: Payer: BC Managed Care – PPO | Admitting: Hematology and Oncology

## 2021-06-26 ENCOUNTER — Inpatient Hospital Stay: Payer: BC Managed Care – PPO | Attending: Hematology and Oncology

## 2021-06-27 ENCOUNTER — Other Ambulatory Visit: Payer: Self-pay

## 2021-06-27 ENCOUNTER — Ambulatory Visit: Payer: Self-pay | Admitting: *Deleted

## 2021-06-27 ENCOUNTER — Ambulatory Visit
Admission: EM | Admit: 2021-06-27 | Discharge: 2021-06-27 | Disposition: A | Discharge status: Home / Self Care | Payer: BC Managed Care – PPO | Attending: Physician Assistant | Admitting: Physician Assistant

## 2021-06-27 DIAGNOSIS — N939 Abnormal uterine and vaginal bleeding, unspecified: Secondary | ICD-10-CM | POA: Diagnosis not present

## 2021-06-27 HISTORY — DX: Hyperlipidemia, unspecified: E78.5

## 2021-06-27 LAB — POCT URINE PREGNANCY: Preg Test, Ur: NEGATIVE

## 2021-06-27 MED ORDER — AMLODIPINE BESYLATE 5 MG PO TABS: ORAL_TABLET | ORAL | 0 refills | Status: DC

## 2021-06-27 MED ORDER — METOPROLOL SUCCINATE ER 50 MG PO TB24: ORAL_TABLET | ORAL | 0 refills | Status: DC

## 2021-06-27 NOTE — ED Triage Notes (Signed)
Pt c/o abd cramps worse than usual, heavy menstrual bleeding.  Onset last night

## 2021-06-27 NOTE — Discharge Instructions (Signed)
°  [facility]  [address] [place], [place] [address]

## 2021-06-27 NOTE — Telephone Encounter (Signed)
Reason for Disposition  General information question, no triage required and triager able to answer question  Answer Assessment - Initial Assessment Questions 1. REASON FOR CALL or QUESTION: "What is your reason for calling today?" or "How can I best help you?" or "What question do you have that I can help answer?"     Patient's daughter is calling- she wants to know what is safe OTC for her mother to take for menstrual cramping with her high BP. Advised patient tylenol may be the only thing OTC- due to high BP. Ibuprofen is usually not recommended with hypertensive patients. Advised GYN follow up due to age and complaint.  Protocols used: Information Only Call - No Triage-A-AH

## 2021-06-27 NOTE — ED Provider Notes (Signed)
[facility]    CSN: [id number] Arrival date & time: 06/27/21  3419      History   Chief Complaint Chief Complaint  Patient presents with   Abdominal Pain    HPI Haley Gonzales is a 51 y.o. female.   Patient here today for evaluation of painful abdominal cramping, heavy menstural bleeding that has occurred in the past but has resolved on its own. She is concerned because it does not seem to be improving this episode. She does have history significant for iron deficiency anemia and has required iron infusions.   She also requests refills of her blood pressure treatment medications if possible.   The history is provided by the patient.  Abdominal Pain Associated symptoms: vaginal bleeding   Associated symptoms: no chills, no fever, no nausea, no vaginal discharge and no vomiting    Past Medical History:  Diagnosis Date   Anemia    Hyperlipidemia    Hypertension    SVT (supraventricular tachycardia) (HCC)     Patient Active Problem List   Diagnosis Date Noted   IDA (iron deficiency anemia) 02/13/2021    Past Surgical History:  Procedure Laterality Date   CESAREAN SECTION      OB History   No obstetric history on file.      Home Medications    Prior to Admission medications   Medication Sig Start Date End Date Taking? Authorizing Provider  amLODipine (NORVASC) 5 MG tablet TAKE 1 TABLET BY MOUTH ONCE DAILY . APPOINTMENT REQUIRED FOR FUTURE REFILLS 06/27/21   Francene Finders, PA-C  atorvastatin (LIPITOR) 40 MG tablet Take 1 tablet (40 mg total) by mouth daily. 04/19/20   Vanessa Kick, MD  metoprolol succinate (TOPROL-XL) 50 MG 24 hr tablet TAKE 1 TABLET BY MOUTH ONCE DAILY *SCHEDULE  AN  APPOINTMENT  FOR  FUTURE  REFILLS 06/27/21   Francene Finders, PA-C  Vitamin D, Ergocalciferol, (DRISDOL) 1.25 MG (50000 UNIT) CAPS capsule Take 50,000 Units by mouth once a week. 02/09/21   [provider]    Family History Family History  Problem  Relation Age of Onset   Hypertension Mother    Lung cancer Father     Social History Social History   Tobacco Use   Smoking status: Former   Smokeless tobacco: Never  Scientific laboratory technician Use: Never used  Substance Use Topics   Alcohol use: Yes    Comment: social   Drug use: No     Allergies   Patient has no known allergies.   Review of Systems Review of Systems  Constitutional:  Negative for chills and fever.  Eyes:  Negative for discharge and redness.  Gastrointestinal:  Positive for abdominal pain. Negative for nausea and vomiting.  Genitourinary:  Positive for vaginal bleeding. Negative for vaginal discharge.    Physical Exam Triage Vital Signs ED Triage Vitals  Enc Vitals Group     BP 06/27/21 1007 (!) 153/94     Pulse Rate 06/27/21 1007 73     Resp 06/27/21 1007 18     Temp 06/27/21 1007 97.9 F (36.6 C)     Temp Source 06/27/21 1007 Oral     SpO2 06/27/21 1007 97 %     Weight --      Height --      Head Circumference --      Peak Flow --      Pain Score 06/27/21 1008 0     Pain Loc --  Pain Edu? --      Excl. in [place]? --    No data found.  Updated Vital Signs BP (!) 153/94 (BP Location: Left Arm)    Pulse 73    Temp 97.9 F (36.6 C) (Oral)    Resp 18    LMP 06/26/2021 (Approximate)    SpO2 97%      Physical Exam Vitals and nursing note reviewed.  Constitutional:      General: She is not in acute distress.    Appearance: Normal appearance. She is not ill-appearing.  HENT:     Head: Normocephalic and atraumatic.  Eyes:     Conjunctiva/sclera: Conjunctivae normal.  Cardiovascular:     Rate and Rhythm: Normal rate.  Pulmonary:     Effort: Pulmonary effort is normal.  Neurological:     Mental Status: She is alert.  Psychiatric:        Mood and Affect: Mood normal.        Behavior: Behavior normal.        Thought Content: Thought content normal.     UC Treatments / Results  Labs (all labs ordered are listed, but only abnormal  results are displayed) Labs Reviewed  POCT URINE PREGNANCY    EKG   Radiology No results found.  Procedures Procedures (including critical care time)  Medications Ordered in UC Medications - No data to display  Initial Impression / Assessment and Plan / UC Course  I have reviewed the triage vital signs and the nursing notes.  Pertinent labs & imaging results that were available during my care of the patient were reviewed by me and considered in my medical decision making (see chart for details).   Given PMH recommended further evaluation in the ED for stat labs, possible ultrasound as she does not currently have GYN for follow up/ consult. Patient is agreeable with plan.   Amlodipine and metoprolol refilled as requested.    Final Clinical Impressions(s) / UC Diagnoses   Final diagnoses:  None     Discharge Instructions       [facility]  [place], [place] [address]        ED Prescriptions     Medication Sig Dispense Auth. Provider   amLODipine (NORVASC) 5 MG tablet TAKE 1 TABLET BY MOUTH ONCE DAILY . APPOINTMENT REQUIRED FOR FUTURE REFILLS 30 tablet Francene Finders, PA-C   metoprolol succinate (TOPROL-XL) 50 MG 24 hr tablet TAKE 1 TABLET BY MOUTH ONCE DAILY *SCHEDULE  AN  APPOINTMENT  FOR  FUTURE  REFILLS 30 tablet Francene Finders, PA-C      PDMP not reviewed this encounter.   Francene Finders, PA-C 06/27/21 1109

## 2021-06-28 ENCOUNTER — Other Ambulatory Visit: Payer: Self-pay

## 2021-06-28 ENCOUNTER — Telehealth: Payer: Self-pay | Admitting: Cardiovascular Disease

## 2021-06-28 ENCOUNTER — Emergency Department (HOSPITAL_COMMUNITY)
Admission: EM | Admit: 2021-06-28 | Discharge: 2021-06-28 | Disposition: A | Discharge status: Home / Self Care | Payer: BC Managed Care – PPO | Attending: Emergency Medicine | Admitting: Emergency Medicine

## 2021-06-28 DIAGNOSIS — Z87891 Personal history of nicotine dependence: Secondary | ICD-10-CM | POA: Insufficient documentation

## 2021-06-28 DIAGNOSIS — N939 Abnormal uterine and vaginal bleeding, unspecified: Secondary | ICD-10-CM | POA: Insufficient documentation

## 2021-06-28 DIAGNOSIS — I10 Essential (primary) hypertension: Secondary | ICD-10-CM | POA: Diagnosis not present

## 2021-06-28 LAB — URINALYSIS, ROUTINE W REFLEX MICROSCOPIC
Bilirubin Urine: NEGATIVE
Glucose, UA: NEGATIVE mg/dL
Ketones, ur: NEGATIVE mg/dL
Leukocytes,Ua: NEGATIVE
Nitrite: NEGATIVE
Protein, ur: NEGATIVE mg/dL
Specific Gravity, Urine: 1.004 — ABNORMAL LOW (ref 1.005–1.030)
pH: 6 (ref 5.0–8.0)

## 2021-06-28 LAB — TYPE AND SCREEN
ABO/RH(D): B POS
Antibody Screen: NEGATIVE

## 2021-06-28 LAB — BASIC METABOLIC PANEL
Anion gap: 8 (ref 5–15)
BUN: 9 mg/dL (ref 6–20)
CO2: 26 mmol/L (ref 22–32)
Calcium: 9.1 mg/dL (ref 8.9–10.3)
Chloride: 102 mmol/L (ref 98–111)
Creatinine, Ser: 0.83 mg/dL (ref 0.44–1.00)
GFR, Estimated: >60 mL/min (ref >60)
Glucose, Bld: 110 mg/dL — ABNORMAL HIGH (ref 70–99)
Potassium: 3.7 mmol/L (ref 3.5–5.1)
Sodium: 136 mmol/L (ref 135–145)

## 2021-06-28 LAB — CBC WITH DIFFERENTIAL/PLATELET
Abs Immature Granulocytes: 0.03 10*3/uL (ref 0.00–0.07)
Basophils Absolute: 0 10*3/uL (ref 0.0–0.1)
Basophils Relative: 0 %
Eosinophils Absolute: 0.2 10*3/uL (ref 0.0–0.5)
Eosinophils Relative: 2 %
HCT: 30.9 % — ABNORMAL LOW (ref 36.0–46.0)
Hemoglobin: 9.6 g/dL — ABNORMAL LOW (ref 12.0–15.0)
Immature Granulocytes: 0 %
Lymphocytes Relative: 27 %
Lymphs Abs: 2.1 10*3/uL (ref 0.7–4.0)
MCH: 27 pg (ref 26.0–34.0)
MCHC: 31.1 g/dL (ref 30.0–36.0)
MCV: 86.8 fL (ref 80.0–100.0)
Monocytes Absolute: 0.4 10*3/uL (ref 0.1–1.0)
Monocytes Relative: 5 %
Neutro Abs: 5.1 10*3/uL (ref 1.7–7.7)
Neutrophils Relative %: 66 %
Platelets: 376 10*3/uL (ref 150–400)
RBC: 3.56 MIL/uL — ABNORMAL LOW (ref 3.87–5.11)
RDW: 13.8 % (ref 11.5–15.5)
WBC: 7.8 10*3/uL (ref 4.0–10.5)
nRBC: 0 % (ref 0.0–0.2)

## 2021-06-28 LAB — I-STAT BETA HCG BLOOD, ED (MC, WL, AP ONLY): I-stat hCG, quantitative: <5 m[IU]/mL (ref <5)

## 2021-06-28 NOTE — Telephone Encounter (Signed)
Spoke to patient she stated she has been having chest pain off and on for the past 2 days.No chest pain at present.Appointment scheduled with [facility] 07/11/21 at 3:20 pm.Advised if chest pain worsens to go to ED.

## 2021-06-28 NOTE — Discharge Instructions (Addendum)
-   please follow up with OBGYN

## 2021-06-28 NOTE — Telephone Encounter (Signed)
° °  Pt c/o of Chest Pain: STAT if CP now or developed within 24 hours  1. Are you having CP right now? No   2. Are you experiencing any other symptoms (ex. SOB, nausea, vomiting, sweating)? None   3. How long have you been experiencing CP? Couple of days   4. Is your CP continuous or coming and going? Off and on  5. Have you taken Nitroglycerin? No   Pt said she's been experiencing CP for couple of days, its off and on, she denied SOB

## 2021-06-28 NOTE — ED Provider Notes (Signed)
[facility] Emergency Department Provider Note MRN:  [id number]  Arrival date & time: 06/28/21     Chief Complaint   Vaginal Bleeding   History of Present Illness   Haley Gonzales is a 51 y.o. year-old female with a history of anemia, htn, hld presenting to the ED with chief complaint of Vaginal bleeding.  Patient presents to emergency department for evaluation of vaginal bleeding at the recommendations of urgent care which she recently visited.  She states that she is currently on her menstrual cycle and has had heavier than normal vaginal bleeding.  She denies family history of bleeding or clotting disorder.  She denies abdominal pain.  She denies dysuria or pelvic discharge.  She does admit to soaking through 2 pads today.  She states that she was initially having heavy bleeding while she was at work however this has since improved.  The patient states that she has not establish care with an OB/GYN however she has recently obtained referral and has an appointment scheduled.  Review of Systems  A thorough review of systems was obtained and all systems are negative except as noted in the HPI and PMH.   Patient's Health History    Past Medical History:  Diagnosis Date   Anemia    Hyperlipidemia    Hypertension    SVT (supraventricular tachycardia) (HCC)     Past Surgical History:  Procedure Laterality Date   CESAREAN SECTION      Family History  Problem Relation Age of Onset   Hypertension Mother    Lung cancer Father     Social History   Socioeconomic History   Marital status: Significant Other    Spouse name: Not on file   Number of children: Not on file   Years of education: Not on file   Highest education level: Not on file  Occupational History   Not on file  Tobacco Use   Smoking status: Former   Smokeless tobacco: Never  Vaping Use   Vaping Use: Never used  Substance and Sexual Activity   Alcohol use: Yes    Comment: social    Drug use: No   Sexual activity: Yes  Other Topics Concern   Not on file  Social History Narrative   Not on file   Social Determinants of Health   Financial Resource Strain: Not on file  Food Insecurity: Not on file  Transportation Needs: Not on file  Physical Activity: Not on file  Stress: Not on file  Social Connections: Not on file  Intimate Partner Violence: Not on file     Physical Exam   Physical Exam Constitutional:      Appearance: She is well-developed. She is not ill-appearing.  HENT:     Head: Normocephalic and atraumatic.  Cardiovascular:     Rate and Rhythm: Normal rate and regular rhythm.     Pulses: Normal pulses.     Heart sounds: Normal heart sounds.  Pulmonary:     Effort: Pulmonary effort is normal.     Breath sounds: Normal breath sounds.  Abdominal:     General: Abdomen is flat. There is no distension.     Palpations: Abdomen is soft.     Tenderness: There is no abdominal tenderness. There is no right CVA tenderness, left CVA tenderness or guarding.  Musculoskeletal:     Right lower leg: No edema.     Left lower leg: No edema.  Neurological:     General: No focal deficit  present.     Mental Status: She is alert and oriented to person, place, and time.      Diagnostic and Interventional Summary    Labs Reviewed  CBC WITH DIFFERENTIAL/PLATELET - Abnormal; Notable for the following components:      Result Value   RBC 3.56 (*)    Hemoglobin 9.6 (*)    HCT 30.9 (*)    All other components within normal limits  BASIC METABOLIC PANEL - Abnormal; Notable for the following components:   Glucose, Bld 110 (*)    All other components within normal limits  URINALYSIS, ROUTINE W REFLEX MICROSCOPIC - Abnormal; Notable for the following components:   Color, Urine STRAW (*)    Specific Gravity, Urine 1.004 (*)    Hgb urine dipstick LARGE (*)    Bacteria, UA RARE (*)    All other components within normal limits  I-STAT BETA HCG BLOOD, ED ([facility], [facility],  [facility] ONLY)  TYPE AND SCREEN  ABO/RH    No orders to display    Medications - No data to display   Procedures  /  Critical Care Procedures  ED Course and Medical Decision Making  Initial Impression and Ddx 51 year old female presenting to emergency department for evaluation of vaginal bleeding.  Differential diagnosis includes but is not limited to the following: Coagulation disorder, STI, UTI, normal menstruation.  I am unsure of the cause of the patient's heavier than normal menstruation at this time.  I have low suspicion for infectious causes contributing.  Also have low concern for structural contributing causes given that she has had no pain.  Past medical/surgical history that increases complexity of ED encounter:  anemia  Interpretation of Diagnostics I personally reviewed the Cardiac Monitor and my interpretation is as follows: Patient noted to be in normal sinus rhythm on cardiac monitor.    Lab work-up was overall unremarkable with the patient's hemoglobin being at baseline.  Patient Reassessment and Ultimate Disposition/Management I discussed at length that the patient would benefit from oral contraceptives however given that she has multiple vascular risk factors will choose not to prescribe these at this time.  I expressed the importance that the patient follow-up with gynecology for this prescription.  She is in agreement.  States that she will call the gynecology number to attempt to expedite her appointment.  Return precautions were provided to the patient which she voiced understanding of.  Patient management required discussion with the following services or consulting groups:  None  Complexity of Problems Addressed Acute illness or injury that poses threat of life of bodily function  Additional Data Reviewed and Analyzed Further history obtained from: None  Final Clinical Impressions(s) / ED Diagnoses     ICD-10-CM   1. Vaginal bleeding  N93.9       ED  Discharge Orders     None        Discharge Instructions Discussed with and Provided to Patient:     Discharge Instructions      - please follow up with OBGYN          Zachery Dakins, MD 06/28/21 6384    Carmin Muskrat, MD 06/28/21 [phone]

## 2021-06-28 NOTE — ED Triage Notes (Signed)
Pt here for heavy vaginal bleeding, passing clots since yesterday. Pt states her cycle  started on Monday and got much worse yesterday w/ lower abd cramps. Denies N/V, dysuria.

## 2021-06-28 NOTE — ED Provider Triage Note (Signed)
Emergency Medicine Provider Triage Evaluation Note  Haley Gonzales , a 51 y.o. female  was evaluated in triage.  Pt complains of vaginal bleeding onset yesterday.  Her cycle started 4 days ago and was worse on yesterday with lower abdominal cramps.  Has not tried medication for symptoms.  Denies nausea, vomiting, dysuria, fever, chills, dizziness, lightheadedness, chest pain, shortness of breath.  Patient has a history of iron deficiency anemia, she does take OTC  iron supplements.   She was evaluated at urgent care and told to come to the ED for further evaluation of her symptoms.  Review of Systems  Positive: As per HPI above. Negative: Fever, chills, dysuria  Physical Exam  BP (!) 152/87 (BP Location: Left Arm)    Pulse 80    Temp 98.7 F (37.1 C) (Oral)    Resp 14    LMP 06/26/2021 (Approximate)    SpO2 100%  Gen:   Awake, no distress   Resp:  Normal effort  MSK:   Moves extremities without difficulty  Other:  No abdominal TTP  Medical Decision Making  Medically screening exam initiated at 5:17 PM.  Appropriate orders placed.  Haley Gonzales was informed that the remainder of the evaluation will be completed by another provider, this initial triage assessment does not replace that evaluation, and the importance of remaining in the ED until their evaluation is complete.    Haley Gonzales A, PA-C 06/28/21 1723

## 2021-07-09 ENCOUNTER — Telehealth: Payer: Self-pay

## 2021-07-09 NOTE — Telephone Encounter (Signed)
Called patient, advised that we need to push appointment from Wednesday to Friday with Dr.Kelly.  Patient verbalized understanding.

## 2021-07-11 ENCOUNTER — Ambulatory Visit: Payer: BC Managed Care – PPO | Admitting: Cardiovascular Disease

## 2021-07-13 ENCOUNTER — Other Ambulatory Visit: Payer: Self-pay

## 2021-07-13 ENCOUNTER — Ambulatory Visit: Payer: BC Managed Care – PPO | Admitting: Cardiovascular Disease

## 2021-08-06 ENCOUNTER — Encounter (HOSPITAL_COMMUNITY): Payer: Self-pay | Admitting: Emergency Medicine

## 2021-08-06 ENCOUNTER — Other Ambulatory Visit: Payer: Self-pay

## 2021-08-06 ENCOUNTER — Emergency Department (HOSPITAL_COMMUNITY)
Admission: EM | Admit: 2021-08-06 | Discharge: 2021-08-07 | Disposition: A | Discharge status: Home / Self Care | Payer: BC Managed Care – PPO | Attending: Emergency Medicine | Admitting: Emergency Medicine

## 2021-08-06 DIAGNOSIS — T7840XA Allergy, unspecified, initial encounter: Secondary | ICD-10-CM | POA: Insufficient documentation

## 2021-08-06 DIAGNOSIS — R030 Elevated blood-pressure reading, without diagnosis of hypertension: Secondary | ICD-10-CM | POA: Insufficient documentation

## 2021-08-06 DIAGNOSIS — Z5321 Procedure and treatment not carried out due to patient leaving prior to being seen by health care provider: Secondary | ICD-10-CM | POA: Diagnosis not present

## 2021-08-06 MED ORDER — DIPHENHYDRAMINE HCL 25 MG PO CAPS
50.0000 mg | ORAL_CAPSULE | Freq: Once | ORAL | Status: AC
Start: 2021-08-06 — End: 2021-08-06
  Administered 2021-08-06: 50 mg via ORAL
  Filled 2021-08-06: qty 2

## 2021-08-06 MED ORDER — FAMOTIDINE 20 MG PO TABS
20.0000 mg | ORAL_TABLET | Freq: Once | ORAL | Status: AC
  Administered 2021-08-06: 20 mg via ORAL
  Filled 2021-08-06: qty 1

## 2021-08-06 MED ORDER — PREDNISONE 20 MG PO TABS
60.0000 mg | ORAL_TABLET | Freq: Once | ORAL | Status: AC
  Administered 2021-08-06: 60 mg via ORAL
  Filled 2021-08-06: qty 3

## 2021-08-06 NOTE — ED Triage Notes (Signed)
Patient ate tuna this evening reports throat feels " weird" with itching , no visible oral swelling , no wheezing or stridor . Respirations unlabored .  ?

## 2021-08-06 NOTE — ED Provider Triage Note (Signed)
Emergency Medicine Provider Triage Evaluation Note ? ?Haley Gonzales , a 51 y.o. female  was evaluated in triage.  Pt complains of  concern for allergic reaction.  Patient states about 30 minutes prior to arrival she ate a can of tuna.  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  She states she began having a scratchy sensation in her throat and a funny feeling on her tongue. She did not take anything specifically for her symptoms due to concern regarding her elevated blood pressure diagnosis and was unsure what she could take with same. She denies wheezing, SOB, hives, difficulty swallowing.  ? ?Review of Systems  ?Positive: + throat swelling ?Negative: - SOB, wheezing ? ?Physical Exam  ?BP (!) 188/85   Pulse 77   Temp 98.5 ?F (36.9 ?C) (Oral)   Resp 18   SpO2 100%  ?Gen:   Awake, no distress   ?Resp:  Normal effort  ?MSK:   Moves extremities without difficulty  ?Other:  Speaking in full sentences. No wheezing. Airway intact. No posterior oropharyngeal edema. Uvula midline.  ? ?Medical Decision Making  ?Medically screening exam initiated at 11:35 PM.  Appropriate orders placed.  Haley Gonzales was informed that the remainder of the evaluation will be completed by another provider, this initial triage assessment does not replace that evaluation, and the importance of remaining in the ED until their evaluation is complete. ? ? ?  ?Eustaquio Maize, PA-C ?08/06/21 2338 ? ?

## 2021-08-07 LAB — POC URINE PREG, ED: Preg Test, Ur: NEGATIVE

## 2021-08-07 NOTE — ED Notes (Signed)
Pt states she feels better and is going to leave ?

## 2021-08-22 ENCOUNTER — Ambulatory Visit: Payer: BC Managed Care – PPO | Admitting: Obstetrics & Gynecology

## 2021-08-24 NOTE — Progress Notes (Signed)
Erroneous encounter

## 2021-08-31 ENCOUNTER — Encounter: Payer: BC Managed Care – PPO | Admitting: Family

## 2021-08-31 DIAGNOSIS — Z7689 Persons encountering health services in other specified circumstances: Secondary | ICD-10-CM

## 2021-10-18 ENCOUNTER — Ambulatory Visit: Payer: BC Managed Care – PPO

## 2021-11-02 ENCOUNTER — Ambulatory Visit: Payer: Self-pay | Admitting: Obstetrics and Gynecology

## 2021-11-02 DIAGNOSIS — Z0289 Encounter for other administrative examinations: Secondary | ICD-10-CM

## 2021-11-07 ENCOUNTER — Encounter: Payer: Self-pay | Admitting: Radiology

## 2021-11-09 ENCOUNTER — Telehealth: Payer: Self-pay

## 2021-11-09 ENCOUNTER — Encounter: Payer: Self-pay | Admitting: Radiology

## 2021-11-09 ENCOUNTER — Ambulatory Visit: Payer: BC Managed Care – PPO | Admitting: Radiology

## 2021-11-09 VITALS — BP 140/84 | Ht 63.75 in | Wt 176.0 lb

## 2021-11-09 DIAGNOSIS — D500 Iron deficiency anemia secondary to blood loss (chronic): Secondary | ICD-10-CM | POA: Diagnosis not present

## 2021-11-09 DIAGNOSIS — N920 Excessive and frequent menstruation with regular cycle: Secondary | ICD-10-CM

## 2021-11-09 DIAGNOSIS — D219 Benign neoplasm of connective and other soft tissue, unspecified: Secondary | ICD-10-CM | POA: Diagnosis not present

## 2021-11-09 MED ORDER — MYFEMBREE 40-1-0.5 MG PO TABS: 1.0000 | ORAL_TABLET | Freq: Every day | ORAL | 2 refills | Status: DC

## 2021-11-09 MED ORDER — ACCRUFER 30 MG PO CAPS: 1.0000 | ORAL_CAPSULE | Freq: Two times a day (BID) | ORAL | 3 refills | Status: DC

## 2021-11-09 NOTE — Telephone Encounter (Signed)
No answer and mailbox is full

## 2021-11-09 NOTE — Telephone Encounter (Signed)
The pharmacy should have directed her to the [employer] website to get her savings card. They will also give her the first month free.

## 2021-11-09 NOTE — Telephone Encounter (Signed)
Pt calling to report that the MyFemb[employer] recently Rxd is too expensive for her even with her insurance it is $277 out of pocket. She is requesting an alternative. Please advise.

## 2021-11-09 NOTE — Progress Notes (Signed)
Haley Gonzales May 05, 1971 [id number]   History:  51 y.o. G4P3 referred for management of menorrhagia with fibroids and anemia. Had u/s 10/05/21 at [facility], we do not have the results, however it was noted patient had fibroid(s). She reports heavy periods, changing pads up to 9 times a day. Clotting occasionally. Was started on iron by her PCP however it has caused nausea and vomiting so severe she has had to leave work. She also experienced constipation while taking it. She has a hx of HTN, takes amlodipine, forgot to take it today. She would like to avoid surgical management if possible.  Gynecologic History Patient's last menstrual period was 10/25/2021 (approximate). Period Pattern: (!) Irregular Menstrual Flow: Heavy Dysmenorrhea: (!) Severe Dysmenorrhea Symptoms: Cramping Contraception/Family planning: tubal ligation Sexually active: yes   Obstetric History OB History  Gravida Para Term Preterm AB Living  '4 3       3  '$ SAB IAB Ectopic Multiple Live Births          3    # Outcome Date GA Lbr Len/2nd Weight Sex Delivery Anes PTL Lv  4 Gravida           3 Para           2 Para           1 Para              The following portions of the patient's history were reviewed and updated as appropriate: allergies, current medications, past family history, past medical history, past social history, past surgical history, and problem list.  Review of Systems Pertinent items noted in HPI and remainder of comprehensive ROS otherwise negative.   Past medical history, past surgical history, family history and social history were all reviewed and documented in the EPIC chart.   Exam:  Vitals:   11/09/21 1117  BP: 140/84  Weight: 176 lb (79.8 kg)  Height: 5' 3.75" (1.619 m)   Body mass index is 30.45 kg/m.  General appearance:  Normal Respiratory  Auscultation:  Clear without wheezing or rhonchi Cardiovascular  Auscultation:  Regular rate, without rubs, murmurs or  gallops  Edema/varicosities:  Not grossly evident Abdominal  Soft,nontender, without masses, guarding or rebound.  Liver/spleen:  No organomegaly noted  Hernia:  None appreciated  Skin  Inspection:  Grossly normal  Genitourinary   Inguinal/mons:  Normal without inguinal adenopathy  External genitalia:  Normal appearing vulva with no masses, tenderness, or lesions  BUS/Urethra/Skene's glands:  Normal without masses or exudate  Vagina:  Normal appearing with normal color and discharge, no lesions  Cervix:  Normal appearing without discharge or lesions  Uterus:  Normal in size, shape and contour.  Mobile, nontender  Adnexa/parametria:     Rt: Normal in size, without masses or tenderness.   Lt: Normal in size, without masses or tenderness.  Anus and perineum: Normal  Chaperone offered and declined   Assessment/Plan:   1. Menorrhagia with regular cycle  2. Fibroids - obtain ultrasound report [facility] to see fibroid(s) measurement - Relugolix-Estradiol-Norethind (MYFEMBREE) 40-1-0.5 MG TABS; Take 1 tablet by mouth daily.  Dispense: 28 tablet; Refill: 2 - monitor BP after starting. If it elevates notify me and we will change management plan.  3. Iron deficiency anemia due to chronic blood loss  - Ferric Maltol (ACCRUFER) 30 MG CAPS; Take 1 capsule by mouth 2 (two) times daily before a meal.  Dispense: 30 capsule; Refill: 3    Follow up 3  months for annual exam and med check. Pt agrees with plan.  Rubbie Battiest B WHNP-BC 11:50 AM 11/09/2021

## 2021-11-12 ENCOUNTER — Telehealth: Payer: Self-pay | Admitting: *Deleted

## 2021-11-12 NOTE — Telephone Encounter (Signed)
Patient came into office requesting assistance with [employer] Rx.   Advised as seen below per Jami. Assisted patient with completing manufacturer savings card. Patient will return call to office if any additional assistance is needed.   Patient states she may have had the pharmacy discontinue RX initially. She will return call to office if new Rx is needed. Patient appreciative of assistance.   Routing to provider for final review. Patient is agreeable to disposition. Will close encounter.

## 2021-11-12 NOTE — Telephone Encounter (Signed)
[facility] sent fax from [facility] for Prior autho regarding Accrufer 30 mg capsule. PA form filled out and signed and faxed. Pending response from [facility].

## 2021-11-16 NOTE — Telephone Encounter (Signed)
Medications approved through 11/13/2022 per [facility].

## 2021-11-21 ENCOUNTER — Emergency Department (HOSPITAL_COMMUNITY): Payer: BC Managed Care – PPO

## 2021-11-21 ENCOUNTER — Other Ambulatory Visit: Payer: Self-pay

## 2021-11-21 ENCOUNTER — Emergency Department (HOSPITAL_COMMUNITY)
Admission: EM | Admit: 2021-11-21 | Discharge: 2021-11-21 | Disposition: A | Discharge status: Home / Self Care | Payer: BC Managed Care – PPO | Attending: Emergency Medicine | Admitting: Emergency Medicine

## 2021-11-21 ENCOUNTER — Encounter (HOSPITAL_COMMUNITY): Payer: Self-pay

## 2021-11-21 DIAGNOSIS — I471 Supraventricular tachycardia: Secondary | ICD-10-CM | POA: Insufficient documentation

## 2021-11-21 DIAGNOSIS — E876 Hypokalemia: Secondary | ICD-10-CM | POA: Insufficient documentation

## 2021-11-21 DIAGNOSIS — I10 Essential (primary) hypertension: Secondary | ICD-10-CM | POA: Diagnosis not present

## 2021-11-21 DIAGNOSIS — Z79899 Other long term (current) drug therapy: Secondary | ICD-10-CM | POA: Diagnosis not present

## 2021-11-21 DIAGNOSIS — D649 Anemia, unspecified: Secondary | ICD-10-CM | POA: Diagnosis not present

## 2021-11-21 LAB — URINALYSIS, ROUTINE W REFLEX MICROSCOPIC
Bacteria, UA: NONE SEEN
Bilirubin Urine: NEGATIVE
Glucose, UA: NEGATIVE mg/dL
Ketones, ur: 5 mg/dL — AB
Leukocytes,Ua: NEGATIVE
Nitrite: NEGATIVE
Protein, ur: NEGATIVE mg/dL
Specific Gravity, Urine: 1.004 — ABNORMAL LOW (ref 1.005–1.030)
pH: 7 (ref 5.0–8.0)

## 2021-11-21 LAB — CBC WITH DIFFERENTIAL/PLATELET
Abs Immature Granulocytes: 0.03 10*3/uL (ref 0.00–0.07)
Basophils Absolute: 0 10*3/uL (ref 0.0–0.1)
Basophils Relative: 0 %
Eosinophils Absolute: 0.2 10*3/uL (ref 0.0–0.5)
Eosinophils Relative: 2 %
HCT: 28.3 % — ABNORMAL LOW (ref 36.0–46.0)
Hemoglobin: 7.4 g/dL — ABNORMAL LOW (ref 12.0–15.0)
Immature Granulocytes: 0 %
Lymphocytes Relative: 41 %
Lymphs Abs: 3.6 10*3/uL (ref 0.7–4.0)
MCH: 19 pg — ABNORMAL LOW (ref 26.0–34.0)
MCHC: 26.1 g/dL — ABNORMAL LOW (ref 30.0–36.0)
MCV: 72.6 fL — ABNORMAL LOW (ref 80.0–100.0)
Monocytes Absolute: 0.6 10*3/uL (ref 0.1–1.0)
Monocytes Relative: 7 %
Neutro Abs: 4.4 10*3/uL (ref 1.7–7.7)
Neutrophils Relative %: 50 %
Platelets: 525 10*3/uL — ABNORMAL HIGH (ref 150–400)
RBC: 3.9 MIL/uL (ref 3.87–5.11)
RDW: 17.6 % — ABNORMAL HIGH (ref 11.5–15.5)
WBC: 8.8 10*3/uL (ref 4.0–10.5)
nRBC: 0 % (ref 0.0–0.2)

## 2021-11-21 LAB — BASIC METABOLIC PANEL
Anion gap: 12 (ref 5–15)
BUN: 8 mg/dL (ref 6–20)
CO2: 19 mmol/L — ABNORMAL LOW (ref 22–32)
Calcium: 8.8 mg/dL — ABNORMAL LOW (ref 8.9–10.3)
Chloride: 106 mmol/L (ref 98–111)
Creatinine, Ser: 0.94 mg/dL (ref 0.44–1.00)
GFR, Estimated: >60 mL/min (ref >60)
Glucose, Bld: 167 mg/dL — ABNORMAL HIGH (ref 70–99)
Potassium: 3.2 mmol/L — ABNORMAL LOW (ref 3.5–5.1)
Sodium: 137 mmol/L (ref 135–145)

## 2021-11-21 LAB — TSH: TSH: 1.743 u[IU]/mL (ref 0.350–4.500)

## 2021-11-21 LAB — PREPARE RBC (CROSSMATCH)

## 2021-11-21 MED ORDER — POTASSIUM CHLORIDE CRYS ER 20 MEQ PO TBCR: 40.0000 meq | EXTENDED_RELEASE_TABLET | Freq: Once | ORAL | Status: DC

## 2021-11-21 MED ORDER — SODIUM CHLORIDE 0.9 % IV BOLUS
1000.0000 mL | Freq: Once | INTRAVENOUS | Status: AC
  Administered 2021-11-21: 1000 mL via INTRAVENOUS

## 2021-11-21 MED ORDER — ADENOSINE 6 MG/2ML IV SOLN
INTRAVENOUS | Status: AC
  Filled 2021-11-21: qty 6

## 2021-11-21 MED ORDER — SODIUM CHLORIDE 0.9 % IV SOLN: 10.0000 mL/h | Freq: Once | INTRAVENOUS | Status: DC

## 2021-11-21 MED ORDER — METOPROLOL SUCCINATE ER 50 MG PO TB24: 50.0000 mg | ORAL_TABLET | Freq: Every day | ORAL | 0 refills | Status: DC

## 2021-11-21 NOTE — ED Provider Notes (Signed)
[facility] EMERGENCY DEPARTMENT Provider Note   CSN: [id number] Arrival date & time: 11/21/21  4098     History  Chief Complaint  Patient presents with   Anxiety   SVT    Haley Gonzales is a 51 y.o. female.  Pt is a 51 yo female with a pmhx significant for SVT, hypertension, anemia, HLD, and UC.  Pt woke up around 0630 this am with feeling like her heart is racing.  She has had this in the past, however, she does not recall the diagnosis of SVT.  According to Epic review, she was diagnosed in 2018 with SVT and has seen cards for this diagnosis.  Pt went out of town this weekend and did not take her metoprolol.  She did take it this am after not taking it for several days.  Pt denies f/c.  No cough.  She feels anxious.  Pt also has a hx of iron deficiency anemia from fibroids with menorrhagia.  She just saw her obgyn on 6/9 and was started on Myfembree and iron.  I don't see that any blood work was obtained at that visit.        Home Medications Prior to Admission medications   Medication Sig Start Date End Date Taking? Authorizing Provider  amLODipine (NORVASC) 5 MG tablet TAKE 1 TABLET BY MOUTH ONCE DAILY . APPOINTMENT REQUIRED FOR FUTURE REFILLS 06/27/21   Francene Finders, PA-C  atorvastatin (LIPITOR) 40 MG tablet Take 1 tablet (40 mg total) by mouth daily. 04/19/20   Vanessa Kick, MD  Ferric Maltol (ACCRUFER) 30 MG CAPS Take 1 capsule by mouth 2 (two) times daily before a meal. 11/09/21   Chrzanowski, Jami B, NP  Ferrous Sulfate (IRON PO) Take 1 tablet by mouth 3 (three) times a week.    [provider]  metoprolol succinate (TOPROL-XL) 50 MG 24 hr tablet Take 1 tablet (50 mg total) by mouth daily. 11/21/21   Isla Pence, MD  Relugolix-Estradiol-Norethind (MYFEMBREE) 40-1-0.5 MG TABS Take 1 tablet by mouth daily. 11/09/21   Chrzanowski, Annitta Needs, NP      Allergies    Patient has no known allergies.    Review of Systems   Review of Systems   Cardiovascular:  Positive for palpitations.  All other systems reviewed and are negative.   Physical Exam Updated Vital Signs BP (!) 138/93   Pulse 79   Temp 98.5 F (36.9 C)   Resp 20   LMP 10/25/2021 (Approximate)   SpO2 100%  Physical Exam Vitals and nursing note reviewed.  Constitutional:      Appearance: Normal appearance.  HENT:     Head: Normocephalic and atraumatic.     Right Ear: External ear normal.     Left Ear: External ear normal.     Nose: Nose normal.     Mouth/Throat:     Mouth: Mucous membranes are moist.     Pharynx: Oropharynx is clear.  Eyes:     Extraocular Movements: Extraocular movements intact.     Conjunctiva/sclera: Conjunctivae normal.     Pupils: Pupils are equal, round, and reactive to light.  Cardiovascular:     Rate and Rhythm: Regular rhythm. Tachycardia present.     Pulses: Normal pulses.     Heart sounds: Normal heart sounds.  Pulmonary:     Effort: Pulmonary effort is normal.     Breath sounds: Normal breath sounds.  Abdominal:     General: Abdomen is flat. Bowel sounds are  normal.     Palpations: Abdomen is soft.  Musculoskeletal:        General: Normal range of motion.     Cervical back: Normal range of motion and neck supple.  Skin:    General: Skin is warm.     Capillary Refill: Capillary refill takes less than 2 seconds.  Neurological:     General: No focal deficit present.     Mental Status: She is alert and oriented to person, place, and time.  Psychiatric:        Mood and Affect: Mood normal.        Behavior: Behavior normal.     ED Results / Procedures / Treatments   Labs (all labs ordered are listed, but only abnormal results are displayed) Labs Reviewed  BASIC METABOLIC PANEL - Abnormal; Notable for the following components:      Result Value   Potassium 3.2 (*)    CO2 19 (*)    Glucose, Bld 167 (*)    Calcium 8.8 (*)    All other components within normal limits  CBC WITH DIFFERENTIAL/PLATELET - Abnormal;  Notable for the following components:   Hemoglobin 7.4 (*)    HCT 28.3 (*)    MCV 72.6 (*)    MCH 19.0 (*)    MCHC 26.1 (*)    RDW 17.6 (*)    Platelets 525 (*)    All other components within normal limits  URINALYSIS, ROUTINE W REFLEX MICROSCOPIC - Abnormal; Notable for the following components:   Color, Urine COLORLESS (*)    Specific Gravity, Urine 1.004 (*)    Hgb urine dipstick MODERATE (*)    Ketones, ur 5 (*)    All other components within normal limits  TSH  MAGNESIUM  TYPE AND SCREEN  PREPARE RBC (CROSSMATCH)    EKG EKG Interpretation  Date/Time:  Wednesday November 21 2021 07:42:53 EDT Ventricular Rate:  96 PR Interval:  154 QRS Duration: 84 QT Interval:  359 QTC Calculation: 454 R Axis:   43 Text Interpretation: Sinus rhythm Abnormal R-wave progression, early transition now in NSR Confirmed by Isla Pence [id number]) on 11/21/2021 7:51:00 AM    Radiology DG Chest Portable 1 View  Result Date: 11/21/2021 CLINICAL DATA:  SV CT. EXAM: PORTABLE CHEST 1 VIEW COMPARISON:  April 19, 2020. FINDINGS: Pacer defibrillator pads project over the chest. EKG leads project over the chest. Cardiac size is stable accounting for decreased lung volumes with accentuated appearance based on AP projection and portable technique. No lobar consolidation. No signs of pulmonary edema or gross effusion. No visible pneumothorax. On limited assessment no acute skeletal findings. IMPRESSION: Low lung volumes without acute cardiopulmonary disease. Electronically Signed   By: Zetta Bills M.D.   On: 11/21/2021 08:05    Procedures Procedures    Medications Ordered in ED Medications  adenosine (ADENOCARD) 6 MG/2ML injection (0 mg  Hold 11/21/21 0741)  0.9 %  sodium chloride infusion (has no administration in time range)  potassium chloride SA (KLOR-CON M) CR tablet 40 mEq (has no administration in time range)  sodium chloride 0.9 % bolus 1,000 mL (0 mLs Intravenous Stopped 11/21/21 0830)     ED Course/ Medical Decision Making/ A&P                           Medical Decision Making Amount and/or Complexity of Data Reviewed Labs: ordered. Radiology: ordered.  Risk Prescription drug management.   This patient  presents to the ED for concern of palpitations, this involves an extensive number of treatment options, and is a complaint that carries with it a high risk of complications and morbidity.  The differential diagnosis includes anxiety, SVT, afib, electrolyte abn, anemia   Co morbidities that complicate the patient evaluation  SVT, hypertension, anemia, HLD, and UC   Additional history obtained:  Additional history obtained from epic chart review  Lab Tests:  I Ordered, and personally interpreted labs.  The pertinent results include:  cbc with hgb low at 7.4 (9.6 on 1/26); bmp with K low at 3.2; glucose slightly elevated at 167; ua nl, tsh nl   Imaging Studies ordered:  I ordered imaging studies including cxr  I independently visualized and interpreted imaging which showed  IMPRESSION:  Low lung volumes without acute cardiopulmonary disease.   I agree with the radiologist interpretation   Cardiac Monitoring:  The patient was maintained on a cardiac monitor.  I personally viewed and interpreted the cardiac monitored which showed an underlying rhythm of: svt initially; now NSR   Medicines ordered and prescription drug management:  I ordered medication including adenosine  for SVT.  However, pt converted on her own.  I have reviewed the patients home medicines and have made adjustments as needed    Critical Interventions:  Blood transfusion   Problem List / ED Course:  SVT:  Occurred after missing a few days of metoprolol.  Pt spontaneously converted prior to receiving adenosine.  PT watched several hrs and stayed in NSR. Symptomatic anemia:  hgb down to 7/4 (it was 9.6 on 1/26).  Pt in agreement to receive 1 unit of blood.  Pt to f/u with  OBGyn. Hypokalemia:  pt given 40 meq of Kdur.   Reevaluation:  After the interventions noted above, I reevaluated the patient and found that they have :resolved   Social Determinants of Health:  Lives at home   Dispostion:  After consideration of the diagnostic results and the patients response to treatment, I feel that the patent would benefit from discharge with outpatient f/u.    CRITICAL CARE Performed by: Isla Pence   Total critical care time: 30 minutes  Critical care time was exclusive of separately billable procedures and treating other patients.  Critical care was necessary to treat or prevent imminent or life-threatening deterioration.  Critical care was time spent personally by me on the following activities: development of treatment plan with patient and/or surrogate as well as nursing, discussions with consultants, evaluation of patient's response to treatment, examination of patient, obtaining history from patient or surrogate, ordering and performing treatments and interventions, ordering and review of laboratory studies, ordering and review of radiographic studies, pulse oximetry and re-evaluation of patient's condition.         Final Clinical Impression(s) / ED Diagnoses Final diagnoses:  SVT (supraventricular tachycardia) (HCC)  Symptomatic anemia  Hypokalemia    Rx / DC Orders ED Discharge Orders          Ordered    metoprolol succinate (TOPROL-XL) 50 MG 24 hr tablet  Daily        11/21/21 1116              Isla Pence, MD 11/21/21 1154

## 2021-11-21 NOTE — ED Triage Notes (Signed)
Pt arrived POV from home c/o anxiety and feeling like her heart is racing. Pt states this started around 630am this morning. Pt denies any pain just states she feels weak.

## 2021-11-21 NOTE — ED Notes (Signed)
Pt brought back to resus in SVT. Adenosine drawn up and prepared to give, this RN and [place] in room. While this RN was preparing to push adenosine, pt self converted from SVT rhythm of 180 to sinus/sinus tach 95-110. Pt reports resolution of sx.

## 2021-11-22 LAB — BPAM RBC
Blood Product Expiration Date: 202307132359
ISSUE DATE / TIME: 202306211034
Unit Type and Rh: 7300

## 2021-11-22 LAB — TYPE AND SCREEN
ABO/RH(D): B POS
Antibody Screen: NEGATIVE
Unit division: 0

## 2022-02-07 ENCOUNTER — Emergency Department (HOSPITAL_COMMUNITY)
Admission: EM | Admit: 2022-02-07 | Discharge: 2022-02-08 | Disposition: A | Discharge status: Home / Self Care | Payer: BC Managed Care – PPO | Attending: Emergency Medicine | Admitting: Emergency Medicine

## 2022-02-07 ENCOUNTER — Encounter (HOSPITAL_COMMUNITY): Payer: Self-pay | Admitting: Emergency Medicine

## 2022-02-07 ENCOUNTER — Ambulatory Visit (INDEPENDENT_AMBULATORY_CARE_PROVIDER_SITE_OTHER)
Admission: EM | Admit: 2022-02-07 | Discharge: 2022-02-07 | Disposition: A | Discharge status: Home / Self Care | Payer: BC Managed Care – PPO | Source: Home / Self Care | Attending: Internal Medicine | Admitting: Internal Medicine

## 2022-02-07 ENCOUNTER — Other Ambulatory Visit: Payer: Self-pay

## 2022-02-07 ENCOUNTER — Encounter (HOSPITAL_COMMUNITY): Payer: Self-pay | Admitting: *Deleted

## 2022-02-07 DIAGNOSIS — I10 Essential (primary) hypertension: Secondary | ICD-10-CM | POA: Insufficient documentation

## 2022-02-07 DIAGNOSIS — D500 Iron deficiency anemia secondary to blood loss (chronic): Secondary | ICD-10-CM | POA: Insufficient documentation

## 2022-02-07 DIAGNOSIS — Z79899 Other long term (current) drug therapy: Secondary | ICD-10-CM | POA: Insufficient documentation

## 2022-02-07 DIAGNOSIS — N9489 Other specified conditions associated with female genital organs and menstrual cycle: Secondary | ICD-10-CM | POA: Insufficient documentation

## 2022-02-07 DIAGNOSIS — D649 Anemia, unspecified: Secondary | ICD-10-CM

## 2022-02-07 DIAGNOSIS — R42 Dizziness and giddiness: Secondary | ICD-10-CM | POA: Diagnosis present

## 2022-02-07 LAB — CBC WITH DIFFERENTIAL/PLATELET
Abs Immature Granulocytes: 0.02 10*3/uL (ref 0.00–0.07)
Abs Immature Granulocytes: 0.02 10*3/uL (ref 0.00–0.07)
Basophils Absolute: 0 10*3/uL (ref 0.0–0.1)
Basophils Absolute: 0 10*3/uL (ref 0.0–0.1)
Basophils Relative: 0 %
Basophils Relative: 0 %
Eosinophils Absolute: 0.1 10*3/uL (ref 0.0–0.5)
Eosinophils Absolute: 0.1 10*3/uL (ref 0.0–0.5)
Eosinophils Relative: 2 %
Eosinophils Relative: 2 %
HCT: 20.6 % — ABNORMAL LOW (ref 36.0–46.0)
HCT: 21.5 % — ABNORMAL LOW (ref 36.0–46.0)
Hemoglobin: 6 g/dL — CL (ref 12.0–15.0)
Hemoglobin: 6 g/dL — CL (ref 12.0–15.0)
Immature Granulocytes: 0 %
Immature Granulocytes: 0 %
Lymphocytes Relative: 32 %
Lymphocytes Relative: 35 %
Lymphs Abs: 2.2 10*3/uL (ref 0.7–4.0)
Lymphs Abs: 2.5 10*3/uL (ref 0.7–4.0)
MCH: 22.1 pg — ABNORMAL LOW (ref 26.0–34.0)
MCH: 21.5 pg — ABNORMAL LOW (ref 26.0–34.0)
MCHC: 29.1 g/dL — ABNORMAL LOW (ref 30.0–36.0)
MCHC: 27.9 g/dL — ABNORMAL LOW (ref 30.0–36.0)
MCV: 75.7 fL — ABNORMAL LOW (ref 80.0–100.0)
MCV: 77.1 fL — ABNORMAL LOW (ref 80.0–100.0)
Monocytes Absolute: 0.6 10*3/uL (ref 0.1–1.0)
Monocytes Absolute: 0.6 10*3/uL (ref 0.1–1.0)
Monocytes Relative: 9 %
Monocytes Relative: 8 %
Neutro Abs: 3.8 10*3/uL (ref 1.7–7.7)
Neutro Abs: 3.8 10*3/uL (ref 1.7–7.7)
Neutrophils Relative %: 57 %
Neutrophils Relative %: 55 %
Platelets: 394 10*3/uL (ref 150–400)
Platelets: 386 10*3/uL (ref 150–400)
RBC: 2.72 MIL/uL — ABNORMAL LOW (ref 3.87–5.11)
RBC: 2.79 MIL/uL — ABNORMAL LOW (ref 3.87–5.11)
RDW: 19.3 % — ABNORMAL HIGH (ref 11.5–15.5)
RDW: 19.2 % — ABNORMAL HIGH (ref 11.5–15.5)
WBC: 6.8 10*3/uL (ref 4.0–10.5)
WBC: 7 10*3/uL (ref 4.0–10.5)
nRBC: 0 % (ref 0.0–0.2)
nRBC: 0 % (ref 0.0–0.2)

## 2022-02-07 LAB — BASIC METABOLIC PANEL
Anion gap: 8 (ref 5–15)
BUN: 10 mg/dL (ref 6–20)
CO2: 24 mmol/L (ref 22–32)
Calcium: 9 mg/dL (ref 8.9–10.3)
Chloride: 105 mmol/L (ref 98–111)
Creatinine, Ser: 0.86 mg/dL (ref 0.44–1.00)
GFR, Estimated: >60 mL/min (ref >60)
Glucose, Bld: 102 mg/dL — ABNORMAL HIGH (ref 70–99)
Potassium: 3.7 mmol/L (ref 3.5–5.1)
Sodium: 137 mmol/L (ref 135–145)

## 2022-02-07 LAB — I-STAT BETA HCG BLOOD, ED (MC, WL, AP ONLY): I-stat hCG, quantitative: <5 m[IU]/mL (ref <5)

## 2022-02-07 MED ORDER — ACCRUFER 30 MG PO CAPS: 1.0000 | ORAL_CAPSULE | Freq: Two times a day (BID) | ORAL | 1 refills | Status: AC

## 2022-02-07 NOTE — ED Provider Triage Note (Signed)
Emergency Medicine Provider Triage Evaluation Note  Haley Gonzales , a 51 y.o. female  was evaluated in triage.  Pt complains of vaginal bleeding which actually resolved today.  She was seen in urgent care for lightheadedness and dizziness, they checked labs and patient was called with a hemoglobin of 6 telling her to go to ED.  Patient feels lightheaded, denies any use of blood thinners.  Known history of fibroids..  Review of Systems  Per HPI  Physical Exam  BP 135/70 (BP Location: Right Arm)   Pulse 73   Temp 99.1 F (37.3 C)   Resp 18   LMP 01/30/2022   SpO2 100%  Gen:   Awake, no distress   Resp:  Normal effort  MSK:   Moves extremities without difficulty  Other:  Pale conjunctive a  Medical Decision Making  Medically screening exam initiated at 9:16 PM.  Appropriate orders placed.  Velta Rockholt was informed that the remainder of the evaluation will be completed by another provider, this initial triage assessment does not replace that evaluation, and the importance of remaining in the ED until their evaluation is complete.     Sherrill Raring, PA-C 02/07/22 2117

## 2022-02-07 NOTE — Discharge Instructions (Addendum)
Maintain adequate hydration Take iron tablets as prescribed.  Make sure you eat before taking the iron tablets We will call you with recommendations if labs are abnormal Return to urgent care if symptoms worsen.

## 2022-02-07 NOTE — ED Provider Notes (Signed)
[facility]    CSN: [id number] Arrival date & time: 02/07/22  1720      History   Chief Complaint Chief Complaint  Patient presents with   Fatigue    HPI Haley Gonzales is a 51 y.o. female with a history of uterine fibroids and menorrhagia comes to urgent care with complaints of fatigue over the past 4 days.  Patient had heavy menstrual period about a week ago.  Period lasted for 4 days.  She was passing blood clots.  She is experiencing increasing fatigue both at rest on exertion.  She denies any palpitations, near syncope or syncopal episodes.  No chest pain or chest pressure.  No abdominal pain.  No nausea vomiting or diarrhea.   HPI  Past Medical History:  Diagnosis Date   Anemia    Hyperlipidemia    Hypertension    Menorrhagia    Prediabetes    SVT (supraventricular tachycardia) ([place])    Ulcerative colitis ([place])    Vitamin D deficiency     Patient Active Problem List   Diagnosis Date Noted   IDA (iron deficiency anemia) 02/13/2021    Past Surgical History:  Procedure Laterality Date   CESAREAN SECTION      OB History     Gravida  4   Para  3   Term      Preterm      AB      Living  3      SAB      IAB      Ectopic      Multiple      Live Births  3            Home Medications    Prior to Admission medications   Medication Sig Start Date End Date Taking? Authorizing Provider  amLODipine (NORVASC) 5 MG tablet TAKE 1 TABLET BY MOUTH ONCE DAILY . APPOINTMENT REQUIRED FOR FUTURE REFILLS 06/27/21   Francene Finders, PA-C  Ferric Maltol (ACCRUFER) 30 MG CAPS Take 1 capsule by mouth 2 (two) times daily after a meal. 02/07/22 03/09/22  Yajahira Tison, Myrene Galas, MD  metoprolol succinate (TOPROL-XL) 50 MG 24 hr tablet Take 1 tablet (50 mg total) by mouth daily. 11/21/21   Isla Pence, MD  Relugolix-Estradiol-Norethind (MYFEMBREE) 40-1-0.5 MG TABS Take 1 tablet by mouth daily. 11/09/21   Chrzanowski, Annitta Needs, NP    Family History Family  History  Problem Relation Age of Onset   Hypertension Mother    Lung cancer Father     Social History Social History   Tobacco Use   Smoking status: Never   Smokeless tobacco: Never  Vaping Use   Vaping Use: Never used  Substance Use Topics   Alcohol use: Yes    Comment: social   Drug use: No     Allergies   Patient has no known allergies.   Review of Systems Review of Systems  Constitutional:  Positive for activity change and fatigue. Negative for unexpected weight change.  HENT: Negative.    Eyes: Negative.   Cardiovascular: Negative.   Gastrointestinal: Negative.   Endocrine: Negative.   Neurological: Negative.      Physical Exam Triage Vital Signs ED Triage Vitals  Enc Vitals Group     BP 02/07/22 1743 138/81     Pulse Rate 02/07/22 1743 79     Resp 02/07/22 1743 17     Temp 02/07/22 1743 98.8 F (37.1 C)     Temp src --  SpO2 02/07/22 1743 99 %     Weight --      Height --      Head Circumference --      Peak Flow --      Pain Score 02/07/22 1741 0     Pain Loc --      Pain Edu? --      Excl. in [place]? --    No data found.  Updated Vital Signs BP 138/81 (BP Location: Right Arm)   Pulse 79   Temp 98.8 F (37.1 C)   Resp 17   LMP 01/30/2022   SpO2 99%   Visual Acuity Right Eye Distance:   Left Eye Distance:   Bilateral Distance:    Right Eye Near:   Left Eye Near:    Bilateral Near:     Physical Exam Vitals and nursing note reviewed.  Constitutional:      General: She is not in acute distress.    Appearance: She is not ill-appearing.  Eyes:     Comments: Conjunctival pallor.  No scleral icterus  Cardiovascular:     Rate and Rhythm: Normal rate and regular rhythm.     Pulses: Normal pulses.     Heart sounds: Normal heart sounds.  Pulmonary:     Effort: Pulmonary effort is normal.     Breath sounds: Normal breath sounds.  Abdominal:     General: Bowel sounds are normal.     Palpations: Abdomen is soft.  Neurological:      Mental Status: She is alert.      UC Treatments / Results  Labs (all labs ordered are listed, but only abnormal results are displayed) Labs Reviewed  CBC WITH DIFFERENTIAL/PLATELET    EKG   Radiology No results found.  Procedures Procedures (including critical care time)  Medications Ordered in UC Medications - No data to display  Initial Impression / Assessment and Plan / UC Course  I have reviewed the triage vital signs and the nursing notes.  Pertinent labs & imaging results that were available during my care of the patient were reviewed by me and considered in my medical decision making (see chart for details).     1.  Symptomatic anemia: CBC This is secondary to menorrhagia Iron tablets We will call patient with recommendations if labs are abnormal.  Return precautions given Final Clinical Impressions(s) / UC Diagnoses   Final diagnoses:  Symptomatic anemia     Discharge Instructions      Maintain adequate hydration Take iron tablets as prescribed.  Make sure you eat before taking the iron tablets We will call you with recommendations if labs are abnormal Return to urgent care if symptoms worsen.   ED Prescriptions     Medication Sig Dispense Auth. Provider   Ferric Maltol (ACCRUFER) 30 MG CAPS Take 1 capsule by mouth 2 (two) times daily after a meal. 30 capsule Rebekka Lobello, Myrene Galas, MD      PDMP not reviewed this encounter.   Chase Picket, MD 02/07/22 [phone]

## 2022-02-07 NOTE — ED Notes (Signed)
Lab called with critical lab HGB 6. Dr Lanny Cramp made aware.  This RN called patient to inform that needs to go to ED urgently due to her critically low HGB.

## 2022-02-07 NOTE — ED Triage Notes (Signed)
Pt feeling lightheaded. Was seen at [facility] for same and called with hemoglobin of 6.0. Reports having vaginal bleeding from fibroids, no longer bleeding

## 2022-02-07 NOTE — ED Triage Notes (Signed)
Pt has fibroids and had a very heavy menstrual 4 days ago that lasted for about 4 days. Hadnt had a menstrual cycle in 2 months due to being on medications for them.  Reports very fatigue now.

## 2022-02-08 ENCOUNTER — Ambulatory Visit: Payer: BC Managed Care – PPO | Admitting: Radiology

## 2022-02-08 LAB — PREPARE RBC (CROSSMATCH)

## 2022-02-08 MED ORDER — SODIUM CHLORIDE 0.9 % IV SOLN: 10.0000 mL/h | Freq: Once | INTRAVENOUS | Status: DC

## 2022-02-08 NOTE — ED Provider Notes (Signed)
[facility] EMERGENCY DEPARTMENT Provider Note   CSN: [id number] Arrival date & time: 02/07/22  1958     History  Chief Complaint  Patient presents with   Anemia    Haley Gonzales is a 51 y.o. female with medical history of fibroids, anemia, menorrhagia, hypertension.  Patient presents to the ED for evaluation of lightheadedness, dizziness.  Patient reports that symptoms began yesterday.  Patient states that 1 week ago she had very heavy menstrual period for 3 days where she was passing blood clots and going through 1 pad an hour.  Patient reports that she recently had an ultrasound which showed fibroids at [facility] in June.  Patient states that she has had to receive transfusions before due to her anemia. Patient reports that her PCP attempted to place her on oral iron however she had to stop due to nausea and vomiting, constipation.  Patient states she was seen at [facility] yesterday and advised to present to ED for low hemoglobin, her hemoglobin was 6 at urgent care. Patient states she is currently not actively bleeding, she denies any lightheadedness, dizziness, weakness, shortness of breath, fevers, chest pain on examination.   Anemia Pertinent negatives include no chest pain and no shortness of breath.       Home Medications Prior to Admission medications   Medication Sig Start Date End Date Taking? Authorizing Provider  amLODipine (NORVASC) 5 MG tablet TAKE 1 TABLET BY MOUTH ONCE DAILY . APPOINTMENT REQUIRED FOR FUTURE REFILLS 06/27/21   Francene Finders, PA-C  Ferric Maltol (ACCRUFER) 30 MG CAPS Take 1 capsule by mouth 2 (two) times daily after a meal. 02/07/22 03/09/22  Lamptey, Myrene Galas, MD  metoprolol succinate (TOPROL-XL) 50 MG 24 hr tablet Take 1 tablet (50 mg total) by mouth daily. 11/21/21   Isla Pence, MD  Relugolix-Estradiol-Norethind (MYFEMBREE) 40-1-0.5 MG TABS Take 1 tablet by mouth daily. 11/09/21   Chrzanowski, Annitta Needs, NP      Allergies     Patient has no known allergies.    Review of Systems   Review of Systems  Constitutional:  Negative for fever.  Respiratory:  Negative for shortness of breath.   Cardiovascular:  Negative for chest pain.  Neurological:  Negative for dizziness, weakness and light-headedness.  All other systems reviewed and are negative.   Physical Exam Updated Vital Signs BP 125/72   Pulse 70   Temp 98 F (36.7 C)   Resp 15   LMP 01/30/2022   SpO2 100%  Physical Exam Vitals and nursing note reviewed.  Constitutional:      Appearance: Normal appearance.  HENT:     Head: Normocephalic and atraumatic.     Nose: Nose normal. No congestion.     Mouth/Throat:     Mouth: Mucous membranes are moist.     Pharynx: Oropharynx is clear.  Eyes:     Extraocular Movements: Extraocular movements intact.     Conjunctiva/sclera: Conjunctivae normal.     Pupils: Pupils are equal, round, and reactive to light.  Cardiovascular:     Rate and Rhythm: Normal rate and regular rhythm.  Pulmonary:     Effort: Pulmonary effort is normal.     Breath sounds: Normal breath sounds. No wheezing.  Abdominal:     General: Abdomen is flat. Bowel sounds are normal.     Palpations: Abdomen is soft.     Tenderness: There is no abdominal tenderness.  Musculoskeletal:     Cervical back: Normal range of  motion and neck supple. No tenderness.  Skin:    General: Skin is warm and dry.     Capillary Refill: Capillary refill takes less than 2 seconds.  Neurological:     General: No focal deficit present.     Mental Status: She is alert and oriented to person, place, and time.     GCS: GCS eye subscore is 4. GCS verbal subscore is 5. GCS motor subscore is 6.     Cranial Nerves: Cranial nerves 2-12 are intact. No cranial nerve deficit.     Sensory: Sensation is intact. No sensory deficit.     Motor: No weakness.     Coordination: Coordination is intact. Heel to [facility] Test normal.      ED Results / Procedures / Treatments    Labs (all labs ordered are listed, but only abnormal results are displayed) Labs Reviewed  CBC WITH DIFFERENTIAL/PLATELET - Abnormal; Notable for the following components:      Result Value   RBC 2.79 (*)    Hemoglobin 6.0 (*)    HCT 21.5 (*)    MCV 77.1 (*)    MCH 21.5 (*)    MCHC 27.9 (*)    RDW 19.2 (*)    All other components within normal limits  BASIC METABOLIC PANEL - Abnormal; Notable for the following components:   Glucose, Bld 102 (*)    All other components within normal limits  I-STAT BETA HCG BLOOD, ED ([facility], [facility], [facility] ONLY)  TYPE AND SCREEN  PREPARE RBC (CROSSMATCH)    EKG None  Radiology No results found.  Procedures Procedures   Medications Ordered in ED Medications  0.9 %  sodium chloride infusion (has no administration in time range)    ED Course/ Medical Decision Making/ A&P                           Medical Decision Making Amount and/or Complexity of Data Reviewed Labs: ordered.  Risk Prescription drug management.   51 year old female presents to ED for evaluation. Please see HPI for further details.   On examination patient afebrile, nontachycardic. Lungs CTAB, not hypoxic. Patient abdomen soft and compressible throughout, no tenderness. Patient neurological examination does not show any focal neurodeficits. Patient nontoxic in appearance, following commands, speaking in full sentences.  Patient hemoglobin here is 6. The patient is currently asymptomatic, she denies lightheadedness, dizziness, weakness, shortness of breath. The patient states she has been transfused and discharged before. The patient was transfused using 1 unit of blood. The patient states after transfusion she still feels completely normal. The patient denies any active vaginal bleeding. Patient still denies any SOB, lightheadedness, dizziness, weakness.   The patient is requesting discharge, states she wishes to attend her granddaughters birthday party later today. I advised the  patient that typically in these situations we would like to recheck her hemoglobin but this requires 2 hours of time to pass prior to checking. The patient states she does not have time to wait 2 hours. After shared decision making conversation I advised the patient she should get her hemoglobin rechecked tomorrow at [facility] or at her PCP on Monday. The patient voices understanding with my instructions. The patient was advised to return to ED with any new or worsening symptoms to include active bleeding, SOB, lightheadedness, dizziness, weakness.   Return precautions were provided and understanding was voiced. Patient had all of her questions answered to her satisfaction prior to discharge. Patient stable  for discharge at this time.   Final Clinical Impression(s) / ED Diagnoses Final diagnoses:  Anemia, unspecified type    Rx / DC Orders ED Discharge Orders     None         Azucena Cecil, PA-C 02/08/22 1006    Charlesetta Shanks, MD 02/08/22 1008

## 2022-02-08 NOTE — Discharge Instructions (Addendum)
Return to ED with any new or worsening symptoms such as shortness of breath, lightheadedness, dizziness, weakness Please return to ED if you begin to have vaginal bleeding Please follow up with urgent care of your PCP for hemoglobin recheck Please continue taking iron pills that were prescribed by PCP Please read through attached guide concerning anemia.

## 2022-02-08 NOTE — ED Notes (Signed)
Pt requesting to speak with MD, EDP notified.

## 2022-02-09 LAB — TYPE AND SCREEN
ABO/RH(D): B POS
Antibody Screen: NEGATIVE
Unit division: 0

## 2022-02-09 LAB — BPAM RBC
Blood Product Expiration Date: 202309152359
ISSUE DATE / TIME: 202309080741
Unit Type and Rh: 1700

## 2022-02-13 ENCOUNTER — Ambulatory Visit: Payer: BC Managed Care – PPO | Admitting: Radiology

## 2022-02-14 ENCOUNTER — Ambulatory Visit (HOSPITAL_COMMUNITY)
Admission: EM | Admit: 2022-02-14 | Discharge: 2022-02-14 | Disposition: A | Discharge status: Home / Self Care | Payer: BC Managed Care – PPO | Attending: Family Medicine | Admitting: Family Medicine

## 2022-02-14 ENCOUNTER — Encounter (HOSPITAL_COMMUNITY): Payer: Self-pay

## 2022-02-14 DIAGNOSIS — D500 Iron deficiency anemia secondary to blood loss (chronic): Secondary | ICD-10-CM | POA: Diagnosis present

## 2022-02-14 LAB — CBC
HCT: 25.2 % — ABNORMAL LOW (ref 36.0–46.0)
Hemoglobin: 7.4 g/dL — ABNORMAL LOW (ref 12.0–15.0)
MCH: 22.6 pg — ABNORMAL LOW (ref 26.0–34.0)
MCHC: 29.4 g/dL — ABNORMAL LOW (ref 30.0–36.0)
MCV: 77.1 fL — ABNORMAL LOW (ref 80.0–100.0)
Platelets: 452 10*3/uL — ABNORMAL HIGH (ref 150–400)
RBC: 3.27 MIL/uL — ABNORMAL LOW (ref 3.87–5.11)
RDW: 19 % — ABNORMAL HIGH (ref 11.5–15.5)
WBC: 7 10*3/uL (ref 4.0–10.5)
nRBC: 0 % (ref 0.0–0.2)

## 2022-02-14 NOTE — ED Provider Notes (Signed)
[place]    CSN: [id number] Arrival date & time: 02/14/22  1803      History   Chief Complaint Chief Complaint  Patient presents with   Follow-up    HPI Haley Gonzales Service is a 51 y.o. female.   HPI Here for anemia.  She was seen here at the urgent care on September 7, and a CBC was drawn.  She was then called and sent to the emergency room for hemoglobin of 6.  She had transfusion of 1 unit of packed red cells on that day.  She is known to have fibroids and menorrhagia.  She will be seeing GYN tomorrow September 14.  She does feel better after the 1 unit of transfusion  Is taking oral iron also  Past Medical History:  Diagnosis Date   Anemia    Hyperlipidemia    Hypertension    Menorrhagia    Prediabetes    SVT (supraventricular tachycardia) (HCC)    Ulcerative colitis ([place])    Vitamin D deficiency     Patient Active Problem List   Diagnosis Date Noted   IDA (iron deficiency anemia) 02/13/2021    Past Surgical History:  Procedure Laterality Date   CESAREAN SECTION      OB History     Gravida  4   Para  3   Term      Preterm      AB      Living  3      SAB      IAB      Ectopic      Multiple      Live Births  3            Home Medications    Prior to Admission medications   Medication Sig Start Date End Date Taking? Authorizing Provider  amLODipine (NORVASC) 5 MG tablet TAKE 1 TABLET BY MOUTH ONCE DAILY . APPOINTMENT REQUIRED FOR FUTURE REFILLS 06/27/21   Francene Finders, PA-C  Ferric Maltol (ACCRUFER) 30 MG CAPS Take 1 capsule by mouth 2 (two) times daily after a meal. 02/07/22 03/09/22  Lamptey, Myrene Galas, MD  metoprolol succinate (TOPROL-XL) 50 MG 24 hr tablet Take 1 tablet (50 mg total) by mouth daily. 11/21/21   Isla Pence, MD  Relugolix-Estradiol-Norethind (MYFEMBREE) 40-1-0.5 MG TABS Take 1 tablet by mouth daily. 11/09/21   Chrzanowski, Annitta Needs, NP    Family History Family History  Problem Relation Age of  Onset   Hypertension Mother    Lung cancer Father     Social History Social History   Tobacco Use   Smoking status: Never   Smokeless tobacco: Never  Vaping Use   Vaping Use: Never used  Substance Use Topics   Alcohol use: Yes    Comment: social   Drug use: No     Allergies   Patient has no known allergies.   Review of Systems Review of Systems   Physical Exam Triage Vital Signs ED Triage Vitals [02/14/22 1823]  Enc Vitals Group     BP (!) 158/81     Pulse Rate 96     Resp 18     Temp 99.2 F (37.3 C)     Temp Source Oral     SpO2 98 %     Weight      Height      Head Circumference      Peak Flow      Pain Score  Pain Loc      Pain Edu?      Excl. in [place]?    No data found.  Updated Vital Signs BP (!) 158/81 (BP Location: Left Arm)   Pulse 96   Temp 99.2 F (37.3 C) (Oral)   Resp 18   LMP 01/30/2022   SpO2 98%   Visual Acuity Right Eye Distance:   Left Eye Distance:   Bilateral Distance:    Right Eye Near:   Left Eye Near:    Bilateral Near:     Physical Exam Vitals reviewed.  Constitutional:      General: She is not in acute distress.    Appearance: She is not toxic-appearing.  HENT:     Mouth/Throat:     Mouth: Mucous membranes are moist.  Eyes:     Extraocular Movements: Extraocular movements intact.     Pupils: Pupils are equal, round, and reactive to light.  Cardiovascular:     Rate and Rhythm: Normal rate and regular rhythm.  Pulmonary:     Effort: Pulmonary effort is normal.     Breath sounds: Normal breath sounds.  Skin:    Coloration: Skin is not jaundiced or pale.  Neurological:     Mental Status: She is alert and oriented to person, place, and time.  Psychiatric:        Behavior: Behavior normal.      UC Treatments / Results  Labs (all labs ordered are listed, but only abnormal results are displayed) Labs Reviewed  CBC    EKG   Radiology No results found.  Procedures Procedures (including critical  care time)  Medications Ordered in UC Medications - No data to display  Initial Impression / Assessment and Plan / UC Course  I have reviewed the triage vital signs and the nursing notes.  Pertinent labs & imaging results that were available during my care of the patient were reviewed by me and considered in my medical decision making (see chart for details).     CBC is drawn today, and we will notify her if there is anything urgent on her lab work.  She will be seeing GYN tomorrow, and they should be able to review her labs in epic also. Final Clinical Impressions(s) / UC Diagnoses   Final diagnoses:  None     Discharge Instructions      We have drawn blood to check your CBC, or blood count.  Staff will notify you if your hemoglobin is seriously low again.  Your GYN doctor should also be able to see your blood work and their computer chart     ED Prescriptions   None    PDMP not reviewed this encounter.   Barrett Henle, MD 02/14/22 [phone]

## 2022-02-14 NOTE — Discharge Instructions (Addendum)
We have drawn blood to check your CBC, or blood count.  Staff will notify you if your hemoglobin is seriously low again.  Your GYN doctor should also be able to see your blood work and their computer chart

## 2022-02-14 NOTE — ED Triage Notes (Signed)
Pt had a blood transfusion on 02/07/2022.Hemoglobin was 6.0  She was advised to repeat lab work in 1-2 weeks.

## 2022-02-15 ENCOUNTER — Telehealth: Payer: Self-pay | Admitting: *Deleted

## 2022-02-15 ENCOUNTER — Encounter: Payer: Self-pay | Admitting: Obstetrics and Gynecology

## 2022-02-15 ENCOUNTER — Other Ambulatory Visit (HOSPITAL_COMMUNITY)
Admission: RE | Admit: 2022-02-15 | Discharge: 2022-02-15 | Disposition: A | Discharge status: Home / Self Care | Payer: BC Managed Care – PPO | Source: Ambulatory Visit | Attending: Radiology | Admitting: Radiology

## 2022-02-15 ENCOUNTER — Ambulatory Visit (INDEPENDENT_AMBULATORY_CARE_PROVIDER_SITE_OTHER): Payer: BC Managed Care – PPO | Admitting: Obstetrics and Gynecology

## 2022-02-15 VITALS — BP 122/80 | HR 94 | Ht 63.0 in | Wt 176.0 lb

## 2022-02-15 DIAGNOSIS — Z124 Encounter for screening for malignant neoplasm of cervix: Secondary | ICD-10-CM | POA: Diagnosis present

## 2022-02-15 DIAGNOSIS — D500 Iron deficiency anemia secondary to blood loss (chronic): Secondary | ICD-10-CM | POA: Diagnosis not present

## 2022-02-15 DIAGNOSIS — N920 Excessive and frequent menstruation with regular cycle: Secondary | ICD-10-CM | POA: Diagnosis not present

## 2022-02-15 DIAGNOSIS — Z01419 Encounter for gynecological examination (general) (routine) without abnormal findings: Secondary | ICD-10-CM

## 2022-02-15 DIAGNOSIS — Z1211 Encounter for screening for malignant neoplasm of colon: Secondary | ICD-10-CM

## 2022-02-15 DIAGNOSIS — D219 Benign neoplasm of connective and other soft tissue, unspecified: Secondary | ICD-10-CM | POA: Diagnosis not present

## 2022-02-15 MED ORDER — NORETHINDRONE ACETATE 5 MG PO TABS: 5.0000 mg | ORAL_TABLET | Freq: Every day | ORAL | 1 refills | Status: DC

## 2022-02-15 NOTE — Patient Instructions (Signed)

## 2022-02-15 NOTE — Telephone Encounter (Signed)
-----   Message from Salvadore Dom, MD sent at 02/15/2022 12:58 PM EDT ----- Please place referral to hematology for iron def anemia, try and get her in soon.  She just had a blood transfusion and her hgb is 7.4.  Thanks, Sharee Pimple

## 2022-02-15 NOTE — Telephone Encounter (Signed)
Referral placed at [facility] they will call to schedule.

## 2022-02-15 NOTE — Progress Notes (Signed)
51 y.o. G4P3 Significant Other Black or African American Not Hispanic or Latino female here for annual exam. She has a h/o menorrhagia, fibroids and anemia. She wants to discuss hysterectomy.  She was started on myfembree by Rubbie Battiest, NP in 6/23. It stopped her bleeding for 2 months, she stopped it because of the hot flashes. After stopping it she got a cycle for 4-5 days (starting ~ 01/30/22). 2 days were very heavy, saturating a pad in less than 30 minutes. Currently spotting.  She was seen in Urgent Care on 02/07/22 secondary to weakness. Her hgb was 6 and she was sent to the ER and had a transfusion.   Yesterday her hgb was 7.4. She is on oral iron.   Prior to the [facility], cycles were monthly x 4-5 days, they were heavy but got really heavy earlier this year.    Not sexually active.   Patient's last menstrual period was 01/30/2022.          Sexually active: No.  The current method of family planning is abstinence.    Exercising: Yes.    Home exercise routine includes walking 1 hrs per day. Smoker:  no  Health Maintenance: Pap:  2022 History of abnormal Pap:  no MMG:  2021  BMD:   n/a Colonoscopy: n/a TDaP:  unknown Gardasil: n/a   reports that she has never smoked. She has never used smokeless tobacco. She reports that she does not currently use alcohol. She reports that she does not use drugs. She works as a Scientist, water quality at [employer].  Past Medical History:  Diagnosis Date   Anemia    Hyperlipidemia    Hypertension    Menorrhagia    Prediabetes    SVT (supraventricular tachycardia) (HCC)    Ulcerative colitis ([place])    Vitamin D deficiency     Past Surgical History:  Procedure Laterality Date   CESAREAN SECTION      Current Outpatient Medications  Medication Sig Dispense Refill   amLODipine (NORVASC) 5 MG tablet TAKE 1 TABLET BY MOUTH ONCE DAILY . APPOINTMENT REQUIRED FOR FUTURE REFILLS 30 tablet 0   Ferric Maltol (ACCRUFER) 30 MG CAPS Take 1 capsule by mouth 2  (two) times daily after a meal. 30 capsule 1   metoprolol succinate (TOPROL-XL) 50 MG 24 hr tablet Take 1 tablet (50 mg total) by mouth daily. 30 tablet 0   Relugolix-Estradiol-Norethind (MYFEMBREE) 40-1-0.5 MG TABS Take 1 tablet by mouth daily. 28 tablet 2   No current facility-administered medications for this visit.    Family History  Problem Relation Age of Onset   Hypertension Mother    Lung cancer Father     Review of Systems  All other systems reviewed and are negative.   Exam:   BP 122/80 (BP Location: Right Arm, Patient Position: Sitting, Cuff Size: Normal)   Pulse 94   Ht '5\' 3"'$  (1.6 m)   Wt 176 lb (79.8 kg)   LMP 01/30/2022   SpO2 98%   BMI 31.18 kg/m   Weight change: '@WEIGHTCHANGE'$ @ Height:   Height: '5\' 3"'$  (160 cm)  Ht Readings from Last 3 Encounters:  02/15/22 '5\' 3"'$  (1.6 m)  11/09/21 5' 3.75" (1.619 m)  08/06/21 '5\' 2"'$  (1.575 m)    General appearance: alert, cooperative and appears stated age Head: Normocephalic, without obvious abnormality, atraumatic Neck: no adenopathy, supple, symmetrical, trachea midline and thyroid normal to inspection and palpation Lungs: clear to auscultation bilaterally Cardiovascular: regular rate and rhythm Breasts: normal appearance,  no masses or tenderness Abdomen: soft, non-tender; non distended,  no masses,  no organomegaly Extremities: extremities normal, atraumatic, no cyanosis or edema Skin: Skin color, texture, turgor normal. No rashes or lesions Lymph nodes: Cervical, supraclavicular, and axillary nodes normal. No abnormal inguinal nodes palpated Neurologic: Grossly normal   Pelvic: External genitalia:  no lesions              Urethra:  normal appearing urethra with no masses, tenderness or lesions              Bartholins and Skenes: normal                 Vagina: normal appearing vagina with normal color and discharge, no lesions, small amount of blood in the vagina              Cervix: no lesions                Bimanual Exam:  Uterus:   anteverted, irregularly shaped, decreased mobility, slightly enlarged, mildly tender              Adnexa: no mass, fullness, tenderness               Rectovaginal: Confirms               Anus:  normal sphincter tone, no lesions  Wandra Scot chaperoned for the exam.   1. Well woman exam Mammogram overdue, # given to call and schedule  2. Menorrhagia with regular cycle Needs further evaluation.  Given her low hgb and the fact that she is starting to spot again, I want to try and suppress her bleeding. Will start Aygestin today - norethindrone (AYGESTIN) 5 MG tablet; Take 1 tablet (5 mg total) by mouth daily. If bleeding picks up increase to 2 x a day and then 3 x a day until bleeding stops.  Dispense: 90 tablet; Refill: 1 - US PELVIS TRANSVAGINAL NON-OB (TV ONLY); Future -Depending on the results of the ultrasound, may need endometrial biopsy -We discussed possible treatment options, including: daily progesterone, restarting myfembree, possible hysteroscopic surgery, possible mirena IUD, possible uterine artery embolization, TLH/BS. -Will meet after her ultrasound for possible biopsy and to discuss treatment options  3. Fibroids - US PELVIS TRANSVAGINAL NON-OB (TV ONLY); Future  4. Iron deficiency anemia due to chronic blood loss - US PELVIS TRANSVAGINAL NON-OB (TV ONLY); Future  5. Screening for cervical cancer - Cytology - PAP  6. Colon cancer screening - Ambulatory referral to Gastroenterology

## 2022-02-18 ENCOUNTER — Telehealth: Payer: Self-pay | Admitting: Hematology and Oncology

## 2022-02-18 NOTE — Telephone Encounter (Signed)
Scheduled appt per 9/15 referral. Pt already established with Dr. Chryl Heck. Pt is aware of appt date/time.

## 2022-02-19 ENCOUNTER — Other Ambulatory Visit: Payer: BC Managed Care – PPO

## 2022-02-19 ENCOUNTER — Other Ambulatory Visit (INDEPENDENT_AMBULATORY_CARE_PROVIDER_SITE_OTHER): Payer: BC Managed Care – PPO

## 2022-02-19 ENCOUNTER — Ambulatory Visit (INDEPENDENT_AMBULATORY_CARE_PROVIDER_SITE_OTHER): Payer: BC Managed Care – PPO | Admitting: Obstetrics and Gynecology

## 2022-02-19 ENCOUNTER — Encounter: Payer: Self-pay | Admitting: Obstetrics and Gynecology

## 2022-02-19 ENCOUNTER — Other Ambulatory Visit: Payer: BC Managed Care – PPO | Admitting: Obstetrics and Gynecology

## 2022-02-19 VITALS — BP 122/60 | HR 71 | Wt 177.0 lb

## 2022-02-19 DIAGNOSIS — D219 Benign neoplasm of connective and other soft tissue, unspecified: Secondary | ICD-10-CM

## 2022-02-19 DIAGNOSIS — D500 Iron deficiency anemia secondary to blood loss (chronic): Secondary | ICD-10-CM | POA: Diagnosis not present

## 2022-02-19 DIAGNOSIS — N920 Excessive and frequent menstruation with regular cycle: Secondary | ICD-10-CM

## 2022-02-19 LAB — CYTOLOGY - PAP
Comment: NEGATIVE
Diagnosis: NEGATIVE
High risk HPV: NEGATIVE

## 2022-02-19 NOTE — Progress Notes (Signed)
GYNECOLOGY  VISIT   HPI: 51 y.o.   Significant Other Black or African American Not Hispanic or Latino  female   G4P3 with Patient's last menstrual period was 01/30/2022.   here for further evaluation of menorrhagia leading to severe anemia.  She was briefly on myfembree in June, but stopped secondary to side effects. ON 02/07/22 her hgb was 6 and she received a blood transfusion. On 02/14/22 her hgb was 7.4.  Last week she was started on Aygestin to try and prevent heavy bleeding while her evaluation was completed and a plan was made. A hematology referral was also placed last week for evaluation and likely iron transfusion.  No bleeding since starting the Aygestin (one tablet a day), no side effects.  Pap from last week is pending.   GYNECOLOGIC HISTORY: Patient's last menstrual period was 01/30/2022. Contraception:abstains Menopausal hormone therapy: no        OB History     Gravida  4   Para  3   Term      Preterm      AB      Living  3      SAB      IAB      Ectopic      Multiple      Live Births  3              Patient Active Problem List   Diagnosis Date Noted   IDA (iron deficiency anemia) 02/13/2021    Past Medical History:  Diagnosis Date   Anemia    Hyperlipidemia    Hypertension    Menorrhagia    Prediabetes    SVT (supraventricular tachycardia) (HCC)    Ulcerative colitis ([place])    Vitamin D deficiency     Past Surgical History:  Procedure Laterality Date   CESAREAN SECTION      Current Outpatient Medications  Medication Sig Dispense Refill   amLODipine (NORVASC) 5 MG tablet TAKE 1 TABLET BY MOUTH ONCE DAILY . APPOINTMENT REQUIRED FOR FUTURE REFILLS 30 tablet 0   Ferric Maltol (ACCRUFER) 30 MG CAPS Take 1 capsule by mouth 2 (two) times daily after a meal. 30 capsule 1   metoprolol succinate (TOPROL-XL) 50 MG 24 hr tablet Take 1 tablet (50 mg total) by mouth daily. 30 tablet 0   norethindrone (AYGESTIN) 5 MG tablet Take 1 tablet (5 mg  total) by mouth daily. If bleeding picks up increase to 2 x a day and then 3 x a day until bleeding stops. 90 tablet 1   No current facility-administered medications for this visit.     ALLERGIES: Patient has no known allergies.  Family History  Problem Relation Age of Onset   Hypertension Mother    Lung cancer Father     Social History   Socioeconomic History   Marital status: Significant Other    Spouse name: Not on file   Number of children: Not on file   Years of education: Not on file   Highest education level: Not on file  Occupational History   Not on file  Tobacco Use   Smoking status: Never   Smokeless tobacco: Never  Vaping Use   Vaping Use: Never used  Substance and Sexual Activity   Alcohol use: Not Currently   Drug use: No   Sexual activity: Not Currently    Partners: Male    Birth control/protection: Condom  Other Topics Concern   Not on file  Social History Narrative  Not on file   Social Determinants of Health   Financial Resource Strain: Not on file  Food Insecurity: Not on file  Transportation Needs: Not on file  Physical Activity: Not on file  Stress: Not on file  Social Connections: Not on file  Intimate Partner Violence: Not on file    ROS  PHYSICAL EXAMINATION:    LMP 01/30/2022     General appearance: alert, cooperative and appears stated age  See u/s report. U/S with fibroid uterus. There is a large intracavitary fibroid, unable to see if it is completely submucosal or if it goes into the myometrial wall.   1. Menorrhagia with regular cycle Leading to severe anemia. Currently not bleeding on daily aygestin. Discussed increasing aygestin to BID or TID if needed.   2. Fibroids Will return for a sonohysterogram to determine location. Possible endometrial biopsy  3. Iron deficiency anemia due to chronic blood loss Has an appointment to f/u with Hematology

## 2022-02-19 NOTE — Telephone Encounter (Signed)
Patient scheduled on 03/08/22

## 2022-03-05 ENCOUNTER — Other Ambulatory Visit: Payer: BC Managed Care – PPO

## 2022-03-05 ENCOUNTER — Other Ambulatory Visit (HOSPITAL_COMMUNITY)
Admission: RE | Admit: 2022-03-05 | Discharge: 2022-03-05 | Disposition: A | Discharge status: Home / Self Care | Payer: BC Managed Care – PPO | Source: Ambulatory Visit | Attending: Obstetrics and Gynecology | Admitting: Obstetrics and Gynecology

## 2022-03-05 ENCOUNTER — Ambulatory Visit (INDEPENDENT_AMBULATORY_CARE_PROVIDER_SITE_OTHER): Payer: BC Managed Care – PPO | Admitting: Obstetrics and Gynecology

## 2022-03-05 ENCOUNTER — Ambulatory Visit (INDEPENDENT_AMBULATORY_CARE_PROVIDER_SITE_OTHER): Payer: BC Managed Care – PPO

## 2022-03-05 ENCOUNTER — Other Ambulatory Visit: Payer: BC Managed Care – PPO | Admitting: Obstetrics and Gynecology

## 2022-03-05 ENCOUNTER — Encounter: Payer: Self-pay | Admitting: Obstetrics and Gynecology

## 2022-03-05 VITALS — BP 118/70 | HR 71

## 2022-03-05 DIAGNOSIS — D500 Iron deficiency anemia secondary to blood loss (chronic): Secondary | ICD-10-CM

## 2022-03-05 DIAGNOSIS — D219 Benign neoplasm of connective and other soft tissue, unspecified: Secondary | ICD-10-CM

## 2022-03-05 DIAGNOSIS — N920 Excessive and frequent menstruation with regular cycle: Secondary | ICD-10-CM | POA: Diagnosis not present

## 2022-03-05 NOTE — Progress Notes (Signed)
GYNECOLOGY  VISIT   HPI: 51 y.o.   Significant Other Black or African American Not Hispanic or Latino  female   [phone] with Patient's last menstrual period was 01/30/2022.   here for further evaluation of her fibroid uterus. Recent ultrasound with submucosal myoma, here for better evaluation via sonohysterogram.   She has a h/o menorrhagia, fibroids and anemia. She wants to discuss hysterectomy.  She was started on myfembree by Rubbie Battiest, NP in 6/23. It stopped her bleeding for 2 months, she stopped it because of the hot flashes. After stopping it she got a cycle for 4-5 days (starting ~ 01/30/22). 2 days were very heavy, saturating a pad in less than 30 minutes.   She was transfused last month secondary to a hgb of 6.   A few weeks ago she was started on Aygestin to try and prevent further bleeding, while completing her evaluation. She is on 5 mg a day, only having light spotting.   No pain. Not sexually active, no h/o dyspareunia.   She has an appointment to see Hematology on Friday. Hgb from 03/02/22 was 8.7. She is taking iron.   GYNECOLOGIC HISTORY: Patient's last menstrual period was 01/30/2022. Contraception:abstaining Menopausal hormone therapy: no        OB History     Gravida  4   Para  4   Term  2   Preterm  2   AB      Living  3      SAB      IAB      Ectopic      Multiple      Live Births  4              Patient Active Problem List   Diagnosis Date Noted   IDA (iron deficiency anemia) 02/13/2021    Past Medical History:  Diagnosis Date   Anemia    Hyperlipidemia    Hypertension    Menorrhagia    Prediabetes    SVT (supraventricular tachycardia)    Ulcerative colitis ([place])    Vitamin D deficiency     Past Surgical History:  Procedure Laterality Date   CESAREAN SECTION      Current Outpatient Medications  Medication Sig Dispense Refill   amLODipine (NORVASC) 5 MG tablet TAKE 1 TABLET BY MOUTH ONCE DAILY . APPOINTMENT  REQUIRED FOR FUTURE REFILLS 30 tablet 0   Ferric Maltol (ACCRUFER) 30 MG CAPS Take 1 capsule by mouth 2 (two) times daily after a meal. 30 capsule 1   metoprolol succinate (TOPROL-XL) 50 MG 24 hr tablet Take 1 tablet (50 mg total) by mouth daily. 30 tablet 0   norethindrone (AYGESTIN) 5 MG tablet Take 1 tablet (5 mg total) by mouth daily. If bleeding picks up increase to 2 x a day and then 3 x a day until bleeding stops. 90 tablet 1   No current facility-administered medications for this visit.     ALLERGIES: Patient has no known allergies.  Family History  Problem Relation Age of Onset   Hypertension Mother    Lung cancer Father     Social History   Socioeconomic History   Marital status: Significant Other    Spouse name: Not on file   Number of children: Not on file   Years of education: Not on file   Highest education level: Not on file  Occupational History   Not on file  Tobacco Use   Smoking status: Never  Smokeless tobacco: Never  Vaping Use   Vaping Use: Never used  Substance and Sexual Activity   Alcohol use: Not Currently   Drug use: No   Sexual activity: Not Currently    Partners: Male    Birth control/protection: Abstinence  Other Topics Concern   Not on file  Social History Narrative   Not on file   Social Determinants of Health   Financial Resource Strain: Not on file  Food Insecurity: Not on file  Transportation Needs: Not on file  Physical Activity: Not on file  Stress: Not on file  Social Connections: Not on file  Intimate Partner Violence: Not on file    ROS  PHYSICAL EXAMINATION:    BP 118/70   Pulse 71   LMP 01/30/2022   SpO2 99%     General appearance: alert, cooperative and appears stated age  Pelvic: External genitalia:  no lesions              Urethra:  normal appearing urethra with no masses, tenderness or lesions              Bartholins and Skenes: normal                 Vagina: normal appearing vagina with normal color and  discharge, no lesions              Cervix: no lesions                Sonohysterogram & Endometrial biopsy The procedure and risks of the procedure were reviewed with the patient, consent form was signed. A speculum was placed in the vagina and the cervix was cleansed with Hibiclens. The uterine evacuator catheter was inserted into the uterine cavity without difficulty. Saline was infused under direct observation with the ultrasound. 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 The myoma measured 8.15 x 8.10 cm. The visualized portion of the endometrium measured 2.94 mm  The uterus sounded to 9 cm. The endometrial biopsy was performed, taking care to get a representative sample, sampling 360 degrees of the uterine cavity. Moderate tissue was obtained. The catheter was removed.    Chaperone was present for exam.  1. Menorrhagia with regular cycle - Surgical pathology( [place]/ [facility]) -Bleeding currently minimal with daily aygestin  2. Fibroids Discussed treatment options, including: continued daily aygestin, myfembree, uterine artery embolization, possible sonata ablation, TLH/BS -for the moment she would like to continue on the daily aygestin -She will keep track of any bleeding and f/u in 3 months  3. Iron deficiency anemia due to chronic blood loss She is f/u with hematology on Friday to discuss iron transfusion

## 2022-03-07 ENCOUNTER — Other Ambulatory Visit: Payer: BC Managed Care – PPO

## 2022-03-07 ENCOUNTER — Other Ambulatory Visit: Payer: BC Managed Care – PPO | Admitting: Obstetrics and Gynecology

## 2022-03-07 LAB — SURGICAL PATHOLOGY

## 2022-03-08 ENCOUNTER — Other Ambulatory Visit: Payer: Self-pay

## 2022-03-08 ENCOUNTER — Inpatient Hospital Stay: Payer: BC Managed Care – PPO | Attending: Hematology and Oncology | Admitting: Hematology and Oncology

## 2022-03-08 ENCOUNTER — Inpatient Hospital Stay: Payer: BC Managed Care – PPO

## 2022-03-08 ENCOUNTER — Encounter: Payer: Self-pay | Admitting: Hematology and Oncology

## 2022-03-08 VITALS — BP 142/86 | HR 76 | Temp 97.7°F | Resp 16 | Ht 63.0 in | Wt 176.2 lb

## 2022-03-08 DIAGNOSIS — D509 Iron deficiency anemia, unspecified: Secondary | ICD-10-CM | POA: Insufficient documentation

## 2022-03-08 DIAGNOSIS — D500 Iron deficiency anemia secondary to blood loss (chronic): Secondary | ICD-10-CM

## 2022-03-08 LAB — CBC WITH DIFFERENTIAL (CANCER CENTER ONLY)
Abs Immature Granulocytes: 0.03 10*3/uL (ref 0.00–0.07)
Basophils Absolute: 0 10*3/uL (ref 0.0–0.1)
Basophils Relative: 0 %
Eosinophils Absolute: 0.1 10*3/uL (ref 0.0–0.5)
Eosinophils Relative: 2 %
HCT: 29.4 % — ABNORMAL LOW (ref 36.0–46.0)
Hemoglobin: 8.5 g/dL — ABNORMAL LOW (ref 12.0–15.0)
Immature Granulocytes: 1 %
Lymphocytes Relative: 23 %
Lymphs Abs: 1.4 10*3/uL (ref 0.7–4.0)
MCH: 22.2 pg — ABNORMAL LOW (ref 26.0–34.0)
MCHC: 28.9 g/dL — ABNORMAL LOW (ref 30.0–36.0)
MCV: 76.8 fL — ABNORMAL LOW (ref 80.0–100.0)
Monocytes Absolute: 0.4 10*3/uL (ref 0.1–1.0)
Monocytes Relative: 7 %
Neutro Abs: 4.2 10*3/uL (ref 1.7–7.7)
Neutrophils Relative %: 67 %
Platelet Count: 471 10*3/uL — ABNORMAL HIGH (ref 150–400)
RBC: 3.83 MIL/uL — ABNORMAL LOW (ref 3.87–5.11)
RDW: 18.2 % — ABNORMAL HIGH (ref 11.5–15.5)
WBC Count: 6.2 10*3/uL (ref 4.0–10.5)
nRBC: 0 % (ref 0.0–0.2)

## 2022-03-08 LAB — CMP (CANCER CENTER ONLY)
ALT: 11 U/L (ref 0–44)
AST: 12 U/L — ABNORMAL LOW (ref 15–41)
Albumin: 3.8 g/dL (ref 3.5–5.0)
Alkaline Phosphatase: 54 U/L (ref 38–126)
Anion gap: 4 — ABNORMAL LOW (ref 5–15)
BUN: 8 mg/dL (ref 6–20)
CO2: 26 mmol/L (ref 22–32)
Calcium: 8.7 mg/dL — ABNORMAL LOW (ref 8.9–10.3)
Chloride: 107 mmol/L (ref 98–111)
Creatinine: 0.85 mg/dL (ref 0.44–1.00)
GFR, Estimated: >60 mL/min (ref >60)
Glucose, Bld: 102 mg/dL — ABNORMAL HIGH (ref 70–99)
Potassium: 3.8 mmol/L (ref 3.5–5.1)
Sodium: 137 mmol/L (ref 135–145)
Total Bilirubin: 0.2 mg/dL — ABNORMAL LOW (ref 0.3–1.2)
Total Protein: 7.6 g/dL (ref 6.5–8.1)

## 2022-03-08 LAB — FERRITIN: Ferritin: 3 ng/mL — ABNORMAL LOW (ref 11–307)

## 2022-03-08 LAB — IRON AND IRON BINDING CAPACITY (CC-WL,HP ONLY)
Iron: 14 ug/dL — ABNORMAL LOW (ref 28–170)
Saturation Ratios: 3 % — ABNORMAL LOW (ref 10.4–31.8)
TIBC: 420 ug/dL (ref 250–450)
UIBC: 406 ug/dL

## 2022-03-08 NOTE — Progress Notes (Signed)
[facility] CONSULT NOTE  Patient Care Team: Patient, No Pcp Per as PCP - General (General Practice) Troy Sine, MD as PCP - Cardiology (Cardiology) Chrzanowski, Annitta Needs, NP as Nurse Practitioner (Radiology)  CHIEF COMPLAINTS/PURPOSE OF CONSULTATION:  IDA  ASSESSMENT & PLAN:   This is a very pleasant 51 year old female patient with iron deficiency anemia referred to hematology for additional recommendations.  We have discussed the following details about iron deficiency anemia. Since last visit, she had severe menstrual bleeding, relates this to some fibroid issues and her hemoglobin went to as low as 4, needed blood transfusion.  She is here back to to see if she needs more iron.  She denies any symptoms now but when her hemoglobin was as low as 4, she felt very short of breath, dizzy, palpitations etc. Physical examination today unremarkable, no concerns.   CBC from today showed hemoglobin of 8.5 g/dL, iron panel and ferritin pending at this time.  We will however arrange for Venofer infusion since she has severe anemia and its likely iron deficiency from menstrual blood loss.  Return to clinic in 6 months or as needed.  Thank you for consulting [place] the care of this patient.  Please do not hesitate to contact us with any questions or concerns.  HISTORY OF PRESENTING ILLNESS:  Haley Gonzales 51 y.o. female is here because of anemia.  [name] is here for follow-up.  After her last visit, she required IV iron and she did really well.  Most recently she once again had very heavy menstruation, her hemoglobin went to as low as 4 she says, required blood transfusion.  She was sent back to hematology to see if she needs more iron.  Currently she is asymptomatic.  Her menstrual cycle has finally ended and she is very glad about that.  Rest of the pertinent 10 point ROS reviewed and negative  REVIEW OF SYSTEMS:   Constitutional: Denies fevers, chills or abnormal night  sweats Eyes: Denies blurriness of vision, double vision or watery eyes Ears, nose, mouth, throat, and face: Denies mucositis or sore throat Respiratory: Denies cough, dyspnea or wheezes Cardiovascular: Denies palpitation, chest discomfort or lower extremity swelling Gastrointestinal:  Denies nausea, heartburn or change in bowel habits Skin: Denies abnormal skin rashes Lymphatics: Denies new lymphadenopathy or easy bruising Neurological:Denies numbness, tingling or new weaknesses Behavioral/Psych: Mood is stable, no new changes  All other systems were reviewed with the patient and are negative.  MEDICAL HISTORY:  Past Medical History:  Diagnosis Date   Anemia    Hyperlipidemia    Hypertension    Menorrhagia    Prediabetes    SVT (supraventricular tachycardia)    Ulcerative colitis ([place])    Vitamin D deficiency     SURGICAL HISTORY: Past Surgical History:  Procedure Laterality Date   CESAREAN SECTION      SOCIAL HISTORY: Social History   Socioeconomic History   Marital status: Significant Other    Spouse name: Not on file   Number of children: Not on file   Years of education: Not on file   Highest education level: Not on file  Occupational History   Not on file  Tobacco Use   Smoking status: Never   Smokeless tobacco: Never  Vaping Use   Vaping Use: Never used  Substance and Sexual Activity   Alcohol use: Not Currently   Drug use: No   Sexual activity: Not Currently    Partners: Male    Birth  control/protection: Abstinence  Other Topics Concern   Not on file  Social History Narrative   Not on file   Social Determinants of Health   Financial Resource Strain: Not on file  Food Insecurity: Not on file  Transportation Needs: Not on file  Physical Activity: Not on file  Stress: Not on file  Social Connections: Not on file  Intimate Partner Violence: Not on file    FAMILY HISTORY: Family History  Problem Relation Age of Onset   Hypertension Mother     Lung cancer Father     ALLERGIES:  has No Known Allergies.  MEDICATIONS:  Current Outpatient Medications  Medication Sig Dispense Refill   amLODipine (NORVASC) 5 MG tablet TAKE 1 TABLET BY MOUTH ONCE DAILY . APPOINTMENT REQUIRED FOR FUTURE REFILLS 30 tablet 0   Ferric Maltol (ACCRUFER) 30 MG CAPS Take 1 capsule by mouth 2 (two) times daily after a meal. 30 capsule 1   metoprolol succinate (TOPROL-XL) 50 MG 24 hr tablet Take 1 tablet (50 mg total) by mouth daily. 30 tablet 0   norethindrone (AYGESTIN) 5 MG tablet Take 1 tablet (5 mg total) by mouth daily. If bleeding picks up increase to 2 x a day and then 3 x a day until bleeding stops. 90 tablet 1   No current facility-administered medications for this visit.    PHYSICAL EXAMINATION: ECOG PERFORMANCE STATUS: 0 - Asymptomatic  Vitals:   03/08/22 0856  BP: (!) 142/86  Pulse: 76  Resp: 16  Temp: 97.7 F (36.5 C)  SpO2: 100%   Filed Weights   03/08/22 0856  Weight: 176 lb 3.2 oz (79.9 kg)    Physical Exam Constitutional:      Appearance: Normal appearance.  Cardiovascular:     Rate and Rhythm: Normal rate and regular rhythm.  Pulmonary:     Effort: Pulmonary effort is normal.     Breath sounds: Normal breath sounds.  Musculoskeletal:        General: No swelling.     Cervical back: Normal range of motion and neck supple. No rigidity.  Lymphadenopathy:     Cervical: No cervical adenopathy.  Skin:    General: Skin is warm and dry.  Neurological:     Mental Status: She is alert.      LABORATORY DATA:  I have reviewed the data as listed Lab Results  Component Value Date   WBC 7.0 02/14/2022   HGB 7.4 (L) 02/14/2022   HCT 25.2 (L) 02/14/2022   MCV 77.1 (L) 02/14/2022   PLT 452 (H) 02/14/2022     Chemistry      Component Value Date/Time   NA 137 02/07/2022 2133   NA 137 07/22/2017 0949   K 3.7 02/07/2022 2133   CL 105 02/07/2022 2133   CO2 24 02/07/2022 2133   BUN 10 02/07/2022 2133   BUN 7 07/22/2017  0949   CREATININE 0.86 02/07/2022 2133      Component Value Date/Time   CALCIUM 9.0 02/07/2022 2133   ALKPHOS 67 07/22/2017 0949   AST 13 07/22/2017 0949   ALT 9 07/22/2017 0949   BILITOT <0.2 07/22/2017 0949     CBC from November 24, 2020 showed a white count of 6100, hemoglobin of 8 g/dL, platelet count 557,000. CMP showed a sodium of 134, potassium 4.4, BUN of 6, creatinine of 0.8.  Ferritin was 3.9 Free T4 was normal.  B12 was 541, folate is 11.33, TIBC of 470 Total protein of 7.3 g/dL, albumin of  4.0 g/dL, alkaline phosphatase is normal at 82 international units/L Hemoglobin A1c 6% vitamin D17.7, low TSH normal at 1.076    RADIOGRAPHIC STUDIES: I have personally reviewed the radiological images as listed and agreed with the findings in the report. US PELVIS TRANSVAGINAL NON-OB (TV ONLY)  Result Date: 03/01/2022 Pelvic ultrasound Indications: menorrhagia Findings: Uterus 12.71 x 10.59 x 11.81 cm, anteverted Fibroids: 1) 6.19 x 4.35, suspect submucosal 2) 1.99 x 2.32 cm, anterior/subserosal 3) 2.45 x 2.36 cm, fundal/intramural 4) 1.61 x 1.19 cm, intramural 5) 3.97 x 2.91 cm, intramural/lateral Endometrium 1.67 mm, not well seen, trace intracavitary fluid Left ovary 3.62 x 1.67 x 2.29 cm Right ovary 3.70 x 2.03 x 3.17 cm Simple cyst 3.58 x 2.42 cm x 1.33 cm, average 2.45 cm No free fluid Impression: Anteverted fibroid uterus Multiple fibroids noted, one suspected to be intracavitary Right ovary with simple ovarian cyst Normal left ovary Sonohysterogram recommended    All questions were answered. The patient knows to call the clinic with any problems, questions or concerns. I spent 20 minutes in the care of this patient including H and P, review of records, counseling and coordination of care.     Benay Pike, MD 03/08/2022 9:07 AM

## 2022-03-15 ENCOUNTER — Other Ambulatory Visit: Payer: Self-pay

## 2022-03-15 ENCOUNTER — Inpatient Hospital Stay: Payer: BC Managed Care – PPO

## 2022-03-15 VITALS — BP 136/73 | HR 71 | Temp 98.7°F | Resp 18

## 2022-03-15 DIAGNOSIS — D500 Iron deficiency anemia secondary to blood loss (chronic): Secondary | ICD-10-CM

## 2022-03-15 DIAGNOSIS — D509 Iron deficiency anemia, unspecified: Secondary | ICD-10-CM | POA: Diagnosis not present

## 2022-03-15 MED ORDER — SODIUM CHLORIDE 0.9 % IV SOLN
200.0000 mg | Freq: Once | INTRAVENOUS | Status: AC
  Administered 2022-03-15: 200 mg via INTRAVENOUS
  Filled 2022-03-15: qty 200

## 2022-03-15 MED ORDER — SODIUM CHLORIDE 0.9 % IV SOLN: Freq: Once | INTRAVENOUS | Status: AC

## 2022-03-15 NOTE — Patient Instructions (Signed)

## 2022-03-18 ENCOUNTER — Inpatient Hospital Stay: Payer: BC Managed Care – PPO

## 2022-03-18 ENCOUNTER — Other Ambulatory Visit: Payer: Self-pay

## 2022-03-18 VITALS — BP 140/70 | HR 69 | Temp 98.4°F | Resp 18

## 2022-03-18 DIAGNOSIS — D509 Iron deficiency anemia, unspecified: Secondary | ICD-10-CM | POA: Diagnosis not present

## 2022-03-18 DIAGNOSIS — D500 Iron deficiency anemia secondary to blood loss (chronic): Secondary | ICD-10-CM

## 2022-03-18 MED ORDER — SODIUM CHLORIDE 0.9 % IV SOLN: Freq: Once | INTRAVENOUS | Status: AC

## 2022-03-18 MED ORDER — SODIUM CHLORIDE 0.9 % IV SOLN
200.0000 mg | Freq: Once | INTRAVENOUS | Status: AC
  Administered 2022-03-18: 200 mg via INTRAVENOUS
  Filled 2022-03-18: qty 200

## 2022-03-18 NOTE — Patient Instructions (Signed)

## 2022-03-19 ENCOUNTER — Inpatient Hospital Stay: Payer: BC Managed Care – PPO

## 2022-03-22 ENCOUNTER — Inpatient Hospital Stay: Payer: BC Managed Care – PPO

## 2022-03-22 MED FILL — Iron Sucrose Inj 20 MG/ML (Fe Equiv): INTRAVENOUS | Qty: 10 | Status: AC

## 2022-03-23 ENCOUNTER — Inpatient Hospital Stay: Payer: BC Managed Care – PPO

## 2022-03-23 VITALS — BP 132/82 | HR 68 | Temp 98.7°F | Resp 18

## 2022-03-23 DIAGNOSIS — D509 Iron deficiency anemia, unspecified: Secondary | ICD-10-CM | POA: Diagnosis not present

## 2022-03-23 DIAGNOSIS — D500 Iron deficiency anemia secondary to blood loss (chronic): Secondary | ICD-10-CM

## 2022-03-23 MED ORDER — SODIUM CHLORIDE 0.9 % IV SOLN: Freq: Once | INTRAVENOUS | Status: AC

## 2022-03-23 MED ORDER — SODIUM CHLORIDE 0.9 % IV SOLN
200.0000 mg | Freq: Once | INTRAVENOUS | Status: AC
  Administered 2022-03-23: 200 mg via INTRAVENOUS
  Filled 2022-03-23: qty 200

## 2022-03-23 NOTE — Patient Instructions (Signed)

## 2022-03-26 ENCOUNTER — Inpatient Hospital Stay: Payer: BC Managed Care – PPO

## 2022-03-26 ENCOUNTER — Other Ambulatory Visit: Payer: Self-pay

## 2022-03-26 VITALS — BP 118/81 | HR 71 | Temp 98.6°F | Resp 18 | Wt 181.8 lb

## 2022-03-26 DIAGNOSIS — D500 Iron deficiency anemia secondary to blood loss (chronic): Secondary | ICD-10-CM

## 2022-03-26 DIAGNOSIS — D509 Iron deficiency anemia, unspecified: Secondary | ICD-10-CM | POA: Diagnosis not present

## 2022-03-26 MED ORDER — SODIUM CHLORIDE 0.9 % IV SOLN: Freq: Once | INTRAVENOUS | Status: AC

## 2022-03-26 MED ORDER — SODIUM CHLORIDE 0.9 % IV SOLN
200.0000 mg | Freq: Once | INTRAVENOUS | Status: AC
  Administered 2022-03-26: 200 mg via INTRAVENOUS
  Filled 2022-03-26: qty 200

## 2022-03-26 NOTE — Progress Notes (Signed)
Pt. declines to stay for 30 minute post observation. States she is feeling "fine" and no issues noted. Vital signs stable, left via ambulation, no shortness of breath noted.

## 2022-03-26 NOTE — Patient Instructions (Signed)

## 2022-03-29 ENCOUNTER — Inpatient Hospital Stay: Payer: BC Managed Care – PPO

## 2022-04-09 ENCOUNTER — Telehealth: Payer: Self-pay | Admitting: *Deleted

## 2022-04-09 NOTE — Telephone Encounter (Signed)
Patient called to follow up from office visit on 03/05/22 reports she has been spotting x 2 weeks now, 2 days ago the spotting increased to a flow.  Bleeding is not heavy, changes pad when using restroom. She asked if you thought she should go back on Myfeebree? And she will just deal with hot flashes, report the myfeebree did stop the bleeding and she doesn't want to bled everyday. Please advise

## 2022-04-10 MED ORDER — MYFEMBREE 40-1-0.5 MG PO TABS: 40.0000 ug | ORAL_TABLET | Freq: Every day | ORAL | 2 refills | Status: DC

## 2022-04-10 NOTE — Telephone Encounter (Signed)
She certainly go back on the Myfembree if she would like to. Remind her that she can only be on it for a total of 2 years.  Make sure she is aware that it can cause some bone loss, can increase her risk of blood clots and breast cancer. If she wants to start in please call in Myfembree, 1 tablet po qd, #28 with 2 refills. Please have her f/u in 3 months.

## 2022-04-10 NOTE — Telephone Encounter (Signed)
Patient came to clinic where she was informed of Dr. Gentry Fitz message.  Patient stated would like to start on the medication but setup appt to return in one month to discuss further options.  Patient also requesting to speak with nurse.

## 2022-04-10 NOTE — Telephone Encounter (Signed)
I called patient and re-read the below message. Rx sent. Message sent to appointments to schedule 3 month follow up.

## 2022-04-12 ENCOUNTER — Inpatient Hospital Stay: Payer: BC Managed Care – PPO | Attending: Hematology and Oncology

## 2022-04-12 ENCOUNTER — Other Ambulatory Visit: Payer: Self-pay

## 2022-04-12 VITALS — BP 140/87 | HR 68 | Temp 99.1°F | Resp 17 | Wt 178.0 lb

## 2022-04-12 DIAGNOSIS — D509 Iron deficiency anemia, unspecified: Secondary | ICD-10-CM | POA: Insufficient documentation

## 2022-04-12 DIAGNOSIS — D500 Iron deficiency anemia secondary to blood loss (chronic): Secondary | ICD-10-CM

## 2022-04-12 MED ORDER — SODIUM CHLORIDE 0.9 % IV SOLN
200.0000 mg | Freq: Once | INTRAVENOUS | Status: AC
  Administered 2022-04-12: 200 mg via INTRAVENOUS
  Filled 2022-04-12: qty 200

## 2022-04-12 MED ORDER — SODIUM CHLORIDE 0.9 % IV SOLN: Freq: Once | INTRAVENOUS | Status: AC

## 2022-04-12 NOTE — Patient Instructions (Signed)

## 2022-04-12 NOTE — Progress Notes (Signed)
Patient declined to remain for 30 minute post iron observation period. Patient verbalized understanding of the purpose of the observation period but declined to stay.

## 2022-04-29 ENCOUNTER — Ambulatory Visit (HOSPITAL_COMMUNITY): Payer: BC Managed Care – PPO

## 2022-04-30 ENCOUNTER — Encounter (HOSPITAL_COMMUNITY): Payer: Self-pay

## 2022-04-30 ENCOUNTER — Ambulatory Visit (HOSPITAL_COMMUNITY)
Admission: EM | Admit: 2022-04-30 | Discharge: 2022-04-30 | Disposition: A | Discharge status: Home / Self Care | Payer: BC Managed Care – PPO | Attending: Family Medicine | Admitting: Family Medicine

## 2022-04-30 ENCOUNTER — Emergency Department (HOSPITAL_COMMUNITY)
Admission: EM | Admit: 2022-04-30 | Discharge: 2022-04-30 | Disposition: A | Discharge status: Home / Self Care | Payer: BC Managed Care – PPO | Attending: Emergency Medicine | Admitting: Emergency Medicine

## 2022-04-30 DIAGNOSIS — I10 Essential (primary) hypertension: Secondary | ICD-10-CM | POA: Diagnosis present

## 2022-04-30 DIAGNOSIS — Z7982 Long term (current) use of aspirin: Secondary | ICD-10-CM | POA: Insufficient documentation

## 2022-04-30 DIAGNOSIS — Z760 Encounter for issue of repeat prescription: Secondary | ICD-10-CM | POA: Diagnosis not present

## 2022-04-30 MED ORDER — AMLODIPINE BESYLATE 5 MG PO TABS: ORAL_TABLET | ORAL | 2 refills | Status: DC

## 2022-04-30 MED ORDER — METOPROLOL SUCCINATE ER 50 MG PO TB24: 50.0000 mg | ORAL_TABLET | Freq: Every day | ORAL | 2 refills | Status: DC

## 2022-04-30 MED ORDER — BLOOD PRESSURE MONITOR KIT: PACK | 0 refills | Status: AC

## 2022-04-30 NOTE — ED Triage Notes (Signed)
Pt arrives with c/o HTN. Pt has been out of BP meds for about a week. Pt denies CP, SOB, or headache.

## 2022-04-30 NOTE — ED Provider Notes (Signed)
[facility] EMERGENCY DEPARTMENT Provider Note   CSN: [id number] Arrival date & time: 04/30/22  2034     History  Chief Complaint  Patient presents with   Hypertension    Haley Gonzales is a 51 y.o. female with PMH HTN, SVT, and uterine fibroids who presents to ED with concern for uncontrolled blood pressure. She has been out of her amlodipine 23m for the last week, went to urgent care today where this was refilled, and when filling this medication purchased a new blood pressure cuff and went home taking her blood pressure 6-7 times and got a number of almost 2673systolic so she came to ED with concern due to this number. Just shortly before this she had taken her amlodipine for the first time in a week. She has a mild bilateral frontal headache that has been present for the last week but denies chest pain, SOB, paresthesias, dizziness, change in vision, abdominal pain, nausea, vomiting, focal weakness, or other complaints. Of note, she started Myfembree ~2 months ago for better control of her uterine bleeding secondary to fibroids and was made aware by OB this may cause elevation in blood pressure. She is unsure if this happened after starting medication because she was not measuring her blood pressure regularly but at previous office visits in the last 2 months reports her BP has been 1419-379systolic.     Home Medications Prior to Admission medications   Medication Sig Start Date End Date Taking? Authorizing Provider  amLODipine (NORVASC) 5 MG tablet TAKE 1 TABLET BY MOUTH ONCE DAILY . APPOINTMENT REQUIRED FOR FUTURE REFILLS 04/30/22   HVanessa Kick MD  Blood Pressure Monitor KIT Dispense based on patient and insurance preference. To check blood pressure as needed. 04/30/22   HVanessa Kick MD  metoprolol succinate (TOPROL-XL) 50 MG 24 hr tablet Take 1 tablet (50 mg total) by mouth daily. 04/30/22   HVanessa Kick MD  Relugolix-Estradiol-Norethind (MYFEMBREE)  40-1-0.5 MG TABS Take 40 mcg by mouth daily. 04/10/22   JSalvadore Dom MD      Allergies    Patient has no known allergies.    Review of Systems   Review of Systems  Constitutional:  Negative for chills and fever.  HENT:  Negative for ear pain and sore throat.   Eyes:  Negative for pain and visual disturbance.  Respiratory:  Negative for cough and shortness of breath.   Cardiovascular:  Negative for chest pain and palpitations.  Gastrointestinal:  Negative for abdominal pain and vomiting.  Genitourinary:  Negative for dysuria and hematuria.  Musculoskeletal:  Negative for arthralgias and back pain.  Skin:  Negative for color change and rash.  Neurological:  Positive for headaches. Negative for dizziness, seizures, syncope, speech difficulty, weakness, light-headedness and numbness.  All other systems reviewed and are negative.   Physical Exam Updated Vital Signs BP (!) 182/94 (BP Location: Right Arm)   Pulse 75   Temp 98.4 F (36.9 C)   Resp 18   LMP 04/12/2022   SpO2 100%  Physical Exam Vitals and nursing note reviewed.  Constitutional:      General: She is not in acute distress.    Appearance: Normal appearance. She is not ill-appearing, toxic-appearing or diaphoretic.  HENT:     Head: Normocephalic and atraumatic.     Mouth/Throat:     Mouth: Mucous membranes are moist.     Pharynx: Oropharynx is clear.  Eyes:     Extraocular Movements: Extraocular movements intact.  Conjunctiva/sclera: Conjunctivae normal.  Cardiovascular:     Rate and Rhythm: Normal rate and regular rhythm.     Heart sounds: Normal heart sounds. No murmur heard. Pulmonary:     Effort: Pulmonary effort is normal. No respiratory distress.     Breath sounds: Normal breath sounds. No wheezing, rhonchi or rales.  Abdominal:     General: Abdomen is flat. There is no distension.     Palpations: Abdomen is soft.     Tenderness: There is no abdominal tenderness. There is no guarding or  rebound.  Musculoskeletal:        General: Normal range of motion.     Cervical back: Normal range of motion and neck supple.     Right lower leg: No edema.     Left lower leg: No edema.  Skin:    General: Skin is warm and dry.     Capillary Refill: Capillary refill takes less than 2 seconds.  Neurological:     Mental Status: She is alert and oriented to person, place, and time. Mental status is at baseline.     Sensory: No sensory deficit.     Motor: No weakness.     Gait: Gait normal.  Psychiatric:        Mood and Affect: Mood normal.        Behavior: Behavior normal.     ED Results / Procedures / Treatments   Labs (all labs ordered are listed, but only abnormal results are displayed) None  EKG None  Radiology No results found.  Procedures None  Medications Ordered in ED None  ED Course/ Medical Decision Making/ A&P                           Medical Decision Making  51 year old female off anti-hypertensive for the last week presents with elevated BP as reported in HPI with mild frontal headache that has been persistent since off her medications but no other signs or symptoms of end organ damage or ACS. She is well appearing and reports her blood pressure kept going up tonight after measuring it several times and this caused her anxiety prompting her ED visit. States she is easily able to follow-up with her PCP tomorrow for further recheck and to discuss better blood pressure control. Agreeable to go to clinic tomorrow for this evaluation. Given strict return precautions and will come back to ED for new or worsening symptoms.          Final Clinical Impression(s) / ED Diagnoses Final diagnoses:  Hypertension, unspecified type    Rx / DC Orders ED Discharge Orders     None         Turner Daniels 04/30/22 2116    Elgie Congo, MD 04/30/22 2255

## 2022-04-30 NOTE — Discharge Instructions (Signed)
Thank you for letting us take care of you today.  Please restart your home amlodipine daily as prescribed. Monitor your blood pressure at home once a day and follow-up with your primary care doctor tomorrow as discussed for repeat check of your blood pressure and further management of this condition.  If you develop any new symptoms such as chest pain, shortness of breath, dizziness, change in vision, nausea, vomiting, confusion, or other concerns you should come back to the ED to be evaluated or call your primary care for further guidance.

## 2022-04-30 NOTE — Discharge Instructions (Signed)
Your blood pressure was noted to be elevated during your visit today. If you are currently taking medication for high blood pressure, please ensure you are taking this as directed. If you do not have a history of high blood pressure and your blood pressure remains persistently elevated, you may need to begin taking a medication at some point. You may return here within the next few days to recheck if unable to see your primary care provider or if you do not have a one.  BP (!) 188/129 (BP Location: Left Arm)   Pulse 77   Temp 98.3 F (36.8 C) (Oral)   Resp 18   LMP 04/12/2022   SpO2 98%   BP Readings from Last 3 Encounters:  04/30/22 (!) 188/129  04/12/22 (!) 140/87  03/26/22 118/81

## 2022-04-30 NOTE — ED Triage Notes (Signed)
Pt states has been out of her b/p meds for a week. States her b/p is high today and having a headache.

## 2022-04-30 NOTE — ED Provider Notes (Signed)
Haley Gonzales   [id number] 04/30/22 Arrival Time: 1217  ASSESSMENT & PLAN:  1. Elevated blood pressure reading in office with diagnosis of hypertension    Is going to fill medicaitons now and take. Will rest at home and check BP later this afternoon. She agrees to ED evaluation should HA worsen after taking BP meds.  Meds ordered this encounter  Medications   amLODipine (NORVASC) 5 MG tablet    Sig: TAKE 1 TABLET BY MOUTH ONCE DAILY . APPOINTMENT REQUIRED FOR FUTURE REFILLS    Dispense:  30 tablet    Refill:  2   metoprolol succinate (TOPROL-XL) 50 MG 24 hr tablet    Sig: Take 1 tablet (50 mg total) by mouth daily.    Dispense:  30 tablet    Refill:  2   Blood Pressure Monitor KIT    Sig: Dispense based on patient and insurance preference. To check blood pressure as needed.    Dispense:  1 kit    Refill:  0   Work note provided.  Reviewed expectations re: course of current medical issues. Questions answered. Outlined signs and symptoms indicating need for more acute intervention. Patient verbalized understanding. After Visit Summary given.   SUBJECTIVE:  Haley Gonzales is a 51 y.o. female who presents with concerns regarding increased blood pressures. She has been tx for HTN. Out of medications x 1 week. Does have HA.  She reports no chest pain on exertion, no dyspnea on exertion, no swelling of ankles, no orthostatic dizziness or lightheadedness, no orthopnea or paroxysmal nocturnal dyspnea, and no palpitations.  Normal ambulation. No extremity sensation changes or weakness. No visual or hearing changes.  Social History   Tobacco Use  Smoking Status Never  Smokeless Tobacco Never   OBJECTIVE:  Vitals:   04/30/22 1426  BP: (!) 188/129  Pulse: 77  Resp: 18  Temp: 98.3 F (36.8 C)  TempSrc: Oral  SpO2: 98%    General appearance: alert; no distress Eyes: PERRLA; EOMI HENT: normocephalic; atraumatic Neck: supple Lungs: clear to auscultation  bilaterally CV: RRR Extremities: no edema; symmetrical with no gross deformities Skin: warm and dry Psychological: alert and cooperative; normal mood and affect   Labs Reviewed Today: Results for orders placed or performed in visit on 03/08/22  Iron and Iron Binding Capacity ([facility] only)  Result Value Ref Range   Iron 14 (L) 28 - 170 ug/dL   TIBC 420 250 - 450 ug/dL   Saturation Ratios 3 (L) 10.4 - 31.8 %   UIBC 406 ug/dL  Ferritin  Result Value Ref Range   Ferritin 3 (L) 11 - 307 ng/mL  CMP ([facility] only)  Result Value Ref Range   Sodium 137 135 - 145 mmol/L   Potassium 3.8 3.5 - 5.1 mmol/L   Chloride 107 98 - 111 mmol/L   CO2 26 22 - 32 mmol/L   Glucose, Bld 102 (H) 70 - 99 mg/dL   BUN 8 6 - 20 mg/dL   Creatinine 0.85 0.44 - 1.00 mg/dL   Calcium 8.7 (L) 8.9 - 10.3 mg/dL   Total Protein 7.6 6.5 - 8.1 g/dL   Albumin 3.8 3.5 - 5.0 g/dL   AST 12 (L) 15 - 41 U/L   ALT 11 0 - 44 U/L   Alkaline Phosphatase 54 38 - 126 U/L   Total Bilirubin 0.2 (L) 0.3 - 1.2 mg/dL   GFR, Estimated >60 >60 mL/min   Anion gap 4 (L) 5 - 15  CBC  with Differential ([facility] Only)  Result Value Ref Range   WBC Count 6.2 4.0 - 10.5 K/uL   RBC 3.83 (L) 3.87 - 5.11 MIL/uL   Hemoglobin 8.5 (L) 12.0 - 15.0 g/dL   HCT 29.4 (L) 36.0 - 46.0 %   MCV 76.8 (L) 80.0 - 100.0 fL   MCH 22.2 (L) 26.0 - 34.0 pg   MCHC 28.9 (L) 30.0 - 36.0 g/dL   RDW 18.2 (H) 11.5 - 15.5 %   Platelet Count 471 (H) 150 - 400 K/uL   nRBC 0.0 0.0 - 0.2 %   Neutrophils Relative % 67 %   Neutro Abs 4.2 1.7 - 7.7 K/uL   Lymphocytes Relative 23 %   Lymphs Abs 1.4 0.7 - 4.0 K/uL   Monocytes Relative 7 %   Monocytes Absolute 0.4 0.1 - 1.0 K/uL   Eosinophils Relative 2 %   Eosinophils Absolute 0.1 0.0 - 0.5 K/uL   Basophils Relative 0 %   Basophils Absolute 0.0 0.0 - 0.1 K/uL   Immature Granulocytes 1 %   Abs Immature Granulocytes 0.03 0.00 - 0.07 K/uL   Labs Reviewed - No data to display  Imaging: No results  found.  No Known Allergies  Past Medical History:  Diagnosis Date   Anemia    Hyperlipidemia    Hypertension    Menorrhagia    Prediabetes    SVT (supraventricular tachycardia)    Ulcerative colitis ([place])    Vitamin D deficiency    Social History   Socioeconomic History   Marital status: Significant Other    Spouse name: Not on file   Number of children: Not on file   Years of education: Not on file   Highest education level: Not on file  Occupational History   Not on file  Tobacco Use   Smoking status: Never   Smokeless tobacco: Never  Vaping Use   Vaping Use: Never used  Substance and Sexual Activity   Alcohol use: Not Currently   Drug use: No   Sexual activity: Not Currently    Partners: Male    Birth control/protection: Abstinence, Pill  Other Topics Concern   Not on file  Social History Narrative   Not on file   Social Determinants of Health   Financial Resource Strain: Not on file  Food Insecurity: Not on file  Transportation Needs: Not on file  Physical Activity: Not on file  Stress: Not on file  Social Connections: Not on file  Intimate Partner Violence: Not on file   Family History  Problem Relation Age of Onset   Hypertension Mother    Lung cancer Father    Past Surgical History:  Procedure Laterality Date   CESAREAN SECTION         Vanessa Kick, MD 04/30/22 [phone]

## 2022-05-01 ENCOUNTER — Telehealth: Payer: Self-pay | Admitting: *Deleted

## 2022-05-01 NOTE — Telephone Encounter (Signed)
Patient called with up date about Myfembree, she restarted medication again for about 2 weeks now. Stopped before due to hot flashes. ( See telephone encounter on 04/09/22)  patient said she was at work with a headache she checked blood pressure and it was elevated 569 systolic and 437'C diastolic sent went to ER ( notes in epic) Patient  She stopped myfembree 3 days ago. Reports she went to [facility] today and was told to double up on her blood pressure medication and follow up in 1 week. She is not bleeding now, cycle is expected to start in the next week or two. Office visit scheduled on 05/09/22 to discuss other options.

## 2022-05-06 NOTE — Progress Notes (Unsigned)
GYNECOLOGY  VISIT   HPI: 51 y.o.   Significant Other Black or African American Not Hispanic or Latino  female   [phone] with Patient's last menstrual period was 04/12/2022.   here to discuss myfembree. Pt stopped taking them due to hot flashes. She wants to discuss a new medication. She has a h/o menorrhagia, fibroids and anemia. Menses were monthly x 4-5 days, 2-3 days, she was saturating a large pad in 30 minutes.   She was originally started on myfembree in 6/23. It stopped her bleeding for 2 months, she went off of it secondary to hot flashes. She then had a very heavy cycle at the end of August where she was saturating a pad in less than 30 minutes.   She needed a blood transfusion in 9/23 secondary to a hgb of 6.   In 9/23 she was started on daily aygestin, 5 mg a day. Initially it helped, but started bleeding, continued to bleed on 2 aygestins a day.   On 03/05/22 She had a sonohysterogram. 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 She had an endometrial biopsy on 03/05/22 that returned with inactive endometrium with stromal progestational changes.    She was continued on daily aygestin. She called on 04/09/22 c/o a 2 week h/o spotting that had turned into a light flow. She was offered to restart myfembree at that time. She was on myfembree and was on it for a week, bleeding stopped. She went off of the myfembree because her blood pressure got very high and she developed hot flashes.   She is here to follow up. Hasn't been on any medication for about 2 weeks, not bleeding yet.   She was seen by Hematology on 03/08/22 and since then has had 5 iron transfusions. The last transfusion was on 04/12/22.   GYNECOLOGIC HISTORY: Patient's last menstrual period was 04/12/2022. Contraception:myfembree Menopausal hormone therapy: none         OB History     Gravida  4   Para  4   Term  2   Preterm  2   AB      Living  3      SAB      IAB      Ectopic       Multiple      Live Births  4              Patient Active Problem List   Diagnosis Date Noted   IDA (iron deficiency anemia) 02/13/2021    Past Medical History:  Diagnosis Date   Anemia    Hyperlipidemia    Hypertension    Menorrhagia    Prediabetes    SVT (supraventricular tachycardia)    Ulcerative colitis ([place])    Vitamin D deficiency     Past Surgical History:  Procedure Laterality Date   CESAREAN SECTION      Current Outpatient Medications  Medication Sig Dispense Refill   amLODipine (NORVASC) 5 MG tablet TAKE 1 TABLET BY MOUTH ONCE DAILY . APPOINTMENT REQUIRED FOR FUTURE REFILLS 30 tablet 2   Blood Pressure Monitor KIT Dispense based on patient and insurance preference. To check blood pressure as needed. 1 kit 0   metoprolol succinate (TOPROL-XL) 50 MG 24 hr tablet Take 1 tablet (50 mg total) by mouth daily. 30 tablet 2   Relugolix-Estradiol-Norethind (MYFEMBREE) 40-1-0.5 MG TABS Take 40 mcg by mouth daily. 28 tablet 2   No current facility-administered medications for this  visit.     ALLERGIES: Patient has no known allergies.  Family History  Problem Relation Age of Onset   Hypertension Mother    Lung cancer Father     Social History   Socioeconomic History   Marital status: Significant Other    Spouse name: Not on file   Number of children: Not on file   Years of education: Not on file   Highest education level: Not on file  Occupational History   Not on file  Tobacco Use   Smoking status: Never   Smokeless tobacco: Never  Vaping Use   Vaping Use: Never used  Substance and Sexual Activity   Alcohol use: Not Currently   Drug use: No   Sexual activity: Not Currently    Partners: Male    Birth control/protection: Abstinence, Pill  Other Topics Concern   Not on file  Social History Narrative   Not on file   Social Determinants of Health   Financial Resource Strain: Not on file  Food Insecurity: Not on file  Transportation Needs:  Not on file  Physical Activity: Not on file  Stress: Not on file  Social Connections: Not on file  Intimate Partner Violence: Not on file    Review of Systems  All other systems reviewed and are negative.   PHYSICAL EXAMINATION:    BP (!) 167/80   Ht 5' 2.5" (1.588 m)   Wt 174 lb (78.9 kg)   LMP 04/12/2022   BMI 31.32 kg/m     General appearance: alert, cooperative and appears stated age   77. Menorrhagia with regular cycle Helped but didn't stop with aygestin Discussed treatment options, including: daily progesterone, myfembree (won't use secondary to worsening HTN), lysteda, lupron, uterine artery embolization, ultrasound ablation, myomectomy and TLH/BS. She is not a candidate for OCP's She could try a mirena IUD, but with the submucosal myoma she would be at greater risk of expulsion.  She would like to try the mirena IUD, will start her on aygestin to try and prevent bleeding Will give one course of lysteda incase she starts cycling on the Aygestin (she won't take them together). She will only take the lysteda if she is hemorrhaging and she will have stopped the Aygestin - CBC - Ferritin - norethindrone (AYGESTIN) 5 MG tablet; Take one tablet a day, increase to BID as needed.  Dispense: 60 tablet; Refill: 1 - tranexamic acid (LYSTEDA) 650 MG TABS tablet; Take 2 tablets (1,300 mg total) by mouth 3 (three) times daily.  Dispense: 30 tablet; Refill: 0  2. Fibroids  3. Iron deficiency anemia due to chronic blood loss - CBC - Ferritin    Over 30 minutes in total patient care.

## 2022-05-09 ENCOUNTER — Ambulatory Visit: Payer: BC Managed Care – PPO | Admitting: Obstetrics and Gynecology

## 2022-05-09 ENCOUNTER — Encounter: Payer: Self-pay | Admitting: Obstetrics and Gynecology

## 2022-05-09 VITALS — BP 167/80 | Ht 62.5 in | Wt 174.0 lb

## 2022-05-09 DIAGNOSIS — N920 Excessive and frequent menstruation with regular cycle: Secondary | ICD-10-CM

## 2022-05-09 DIAGNOSIS — D219 Benign neoplasm of connective and other soft tissue, unspecified: Secondary | ICD-10-CM

## 2022-05-09 DIAGNOSIS — D500 Iron deficiency anemia secondary to blood loss (chronic): Secondary | ICD-10-CM

## 2022-05-09 MED ORDER — NORETHINDRONE ACETATE 5 MG PO TABS: ORAL_TABLET | ORAL | 1 refills | Status: DC

## 2022-05-09 MED ORDER — TRANEXAMIC ACID 650 MG PO TABS: 1300.0000 mg | ORAL_TABLET | Freq: Three times a day (TID) | ORAL | 0 refills | Status: DC

## 2022-05-10 ENCOUNTER — Other Ambulatory Visit: Payer: Self-pay | Admitting: *Deleted

## 2022-05-10 ENCOUNTER — Telehealth: Payer: Self-pay | Admitting: *Deleted

## 2022-05-10 DIAGNOSIS — N920 Excessive and frequent menstruation with regular cycle: Secondary | ICD-10-CM

## 2022-05-10 DIAGNOSIS — D500 Iron deficiency anemia secondary to blood loss (chronic): Secondary | ICD-10-CM

## 2022-05-10 LAB — CBC
HCT: 35.5 % (ref 35.0–45.0)
Hemoglobin: 11.3 g/dL — ABNORMAL LOW (ref 11.7–15.5)
MCH: 26.5 pg — ABNORMAL LOW (ref 27.0–33.0)
MCHC: 31.8 g/dL — ABNORMAL LOW (ref 32.0–36.0)
MCV: 83.1 fL (ref 80.0–100.0)
MPV: 10.6 fL (ref 7.5–12.5)
Platelets: 389 10*3/uL (ref 140–400)
RBC: 4.27 10*6/uL (ref 3.80–5.10)
RDW: 19 % — ABNORMAL HIGH (ref 11.0–15.0)
WBC: 5.8 10*3/uL (ref 3.8–10.8)

## 2022-05-10 LAB — FERRITIN: Ferritin: 35 ng/mL (ref 16–232)

## 2022-05-10 NOTE — Telephone Encounter (Signed)
-----   Message from Salvadore Dom, MD sent at 05/09/2022  4:57 PM EST ----- Can you please try to get this patient approved for a mirena IUD and put her on my schedule at 1 pm on Monday (if possible). I'm trying to get this IUD in before she starts to hemorrhage. Thanks, Sharee Pimple

## 2022-05-10 NOTE — Telephone Encounter (Signed)
Spoke with patient, advised per Dr. Talbert Nan.  Scheduled for Mirena IUD insert on 12/11 at 1300. Advised patient benefits will be reviewed and business office will call her Monday morning. If PA required will need to reschedule. Patient verbalizes understanding and is agreeable.    Miranda notified for review of benefits.   Routing to [place].

## 2022-05-13 ENCOUNTER — Encounter: Payer: Self-pay | Admitting: Obstetrics and Gynecology

## 2022-05-13 ENCOUNTER — Ambulatory Visit: Payer: BC Managed Care – PPO | Admitting: Obstetrics and Gynecology

## 2022-05-13 VITALS — BP 132/80 | HR 82 | Wt 177.0 lb

## 2022-05-13 DIAGNOSIS — Z01812 Encounter for preprocedural laboratory examination: Secondary | ICD-10-CM

## 2022-05-13 DIAGNOSIS — N920 Excessive and frequent menstruation with regular cycle: Secondary | ICD-10-CM | POA: Diagnosis not present

## 2022-05-13 DIAGNOSIS — D219 Benign neoplasm of connective and other soft tissue, unspecified: Secondary | ICD-10-CM

## 2022-05-13 DIAGNOSIS — D500 Iron deficiency anemia secondary to blood loss (chronic): Secondary | ICD-10-CM

## 2022-05-13 LAB — PREGNANCY, URINE: Preg Test, Ur: NEGATIVE

## 2022-05-13 NOTE — Patient Instructions (Signed)

## 2022-05-13 NOTE — Progress Notes (Signed)
GYNECOLOGY  VISIT   HPI: 51 y.o.   Significant Other Black or African American Not Hispanic or Latino  female   [phone] with Patient's last menstrual period was 04/12/2022.   here for IUD insertion for menorrhagia and fibroids.   GYNECOLOGIC HISTORY: Patient's last menstrual period was 04/12/2022. Contraception:none sexually active about 7 months ago. H/O tubal ligation Menopausal hormone therapy: none         OB History     Gravida  4   Para  4   Term  2   Preterm  2   AB      Living  3      SAB      IAB      Ectopic      Multiple      Live Births  4              Patient Active Problem List   Diagnosis Date Noted   IDA (iron deficiency anemia) 02/13/2021    Past Medical History:  Diagnosis Date   Anemia    Hyperlipidemia    Hypertension    Menorrhagia    Prediabetes    SVT (supraventricular tachycardia)    Ulcerative colitis ([place])    Vitamin D deficiency     Past Surgical History:  Procedure Laterality Date   CESAREAN SECTION     TUBAL LIGATION      Current Outpatient Medications  Medication Sig Dispense Refill   amLODipine (NORVASC) 5 MG tablet TAKE 1 TABLET BY MOUTH ONCE DAILY . APPOINTMENT REQUIRED FOR FUTURE REFILLS 30 tablet 2   Blood Pressure Monitor KIT Dispense based on patient and insurance preference. To check blood pressure as needed. 1 kit 0   metoprolol succinate (TOPROL-XL) 50 MG 24 hr tablet Take 1 tablet (50 mg total) by mouth daily. 30 tablet 2   norethindrone (AYGESTIN) 5 MG tablet Take one tablet a day, increase to BID as needed. 60 tablet 1   tranexamic acid (LYSTEDA) 650 MG TABS tablet Take 2 tablets (1,300 mg total) by mouth 3 (three) times daily. (Patient not taking: Reported on 05/13/2022) 30 tablet 0   No current facility-administered medications for this visit.     ALLERGIES: Patient has no known allergies.  Family History  Problem Relation Age of Onset   Hypertension Mother    Lung cancer Father      Social History   Socioeconomic History   Marital status: Significant Other    Spouse name: Not on file   Number of children: Not on file   Years of education: Not on file   Highest education level: Not on file  Occupational History   Not on file  Tobacco Use   Smoking status: Never   Smokeless tobacco: Never  Vaping Use   Vaping Use: Never used  Substance and Sexual Activity   Alcohol use: Not Currently   Drug use: No   Sexual activity: Not Currently    Partners: Male    Birth control/protection: Abstinence, Pill  Other Topics Concern   Not on file  Social History Narrative   Not on file   Social Determinants of Health   Financial Resource Strain: Not on file  Food Insecurity: Not on file  Transportation Needs: Not on file  Physical Activity: Not on file  Stress: Not on file  Social Connections: Not on file  Intimate Partner Violence: Not on file    Review of Systems  All other systems reviewed and are  negative.   PHYSICAL EXAMINATION:    BP 132/80   Pulse 82   Wt 177 lb (80.3 kg)   LMP 04/12/2022   SpO2 100%   BMI 31.86 kg/m     General appearance: alert, cooperative and appears stated age  Pelvic: External genitalia:  no lesions              Urethra:  normal appearing urethra with no masses, tenderness or lesions              Bartholins and Skenes: normal                 Vagina: normal appearing vagina with normal color and discharge, no lesions              Cervix: no lesions  The risks of the mirena IUD were reviewed with the patient, including infection, abnormal bleeding and uterine perfortion. Consent was signed.  A speculum was placed in the vagina, the cervix was cleansed with betadine. A tenaculum was placed on the cervix, the uterus sounded to 9+ cm. The cervix was dilated to a #5 hagar dilator  The mirena IUD was inserted without difficulty. The string were cut to 3 cm.    The patient tolerated the procedure well.                  Chaperone was present for exam.  1. Menorrhagia with regular cycle Not a candidate for OCP's, not controlled with daily aygestin, she didn't tolerate myfembree. Mirena IUD inserted She will continue on the daily aygestin for the next few months as the mirena IUD takes effect.  F/U in one month  2. Iron deficiency anemia due to chronic blood loss Currently Hgb is up, s/p iron transfusions  3. Fibroids  4. Pre-procedure lab exam - Pregnancy, urine

## 2022-05-21 NOTE — Telephone Encounter (Signed)
Patient seen in office on 05/13/22.   Encounter closed.

## 2022-06-18 ENCOUNTER — Ambulatory Visit: Payer: BC Managed Care – PPO | Admitting: Obstetrics and Gynecology

## 2022-07-02 ENCOUNTER — Ambulatory Visit: Payer: BC Managed Care – PPO | Admitting: Obstetrics and Gynecology

## 2022-07-16 NOTE — Progress Notes (Signed)
Erroneous encounter-disregard

## 2022-07-19 ENCOUNTER — Encounter: Payer: BC Managed Care – PPO | Admitting: Family

## 2022-07-19 DIAGNOSIS — Z7689 Persons encountering health services in other specified circumstances: Secondary | ICD-10-CM

## 2022-07-23 ENCOUNTER — Encounter: Payer: Self-pay | Admitting: Obstetrics and Gynecology

## 2022-07-23 ENCOUNTER — Ambulatory Visit: Payer: BC Managed Care – PPO | Admitting: Obstetrics and Gynecology

## 2022-07-23 VITALS — BP 132/70 | HR 86 | Wt 183.0 lb

## 2022-07-23 DIAGNOSIS — D500 Iron deficiency anemia secondary to blood loss (chronic): Secondary | ICD-10-CM | POA: Diagnosis not present

## 2022-07-23 DIAGNOSIS — Z30431 Encounter for routine checking of intrauterine contraceptive device: Secondary | ICD-10-CM | POA: Diagnosis not present

## 2022-07-23 DIAGNOSIS — N939 Abnormal uterine and vaginal bleeding, unspecified: Secondary | ICD-10-CM

## 2022-07-23 NOTE — Progress Notes (Signed)
GYNECOLOGY  VISIT   HPI: 52 y.o.   Significant Other Black or African American Not Hispanic or Latino  female   [phone] with No LMP recorded. (Menstrual status: IUD).   here for mirena IUD follow up. She had a mirena IUD inserted in 12/23 for treatment of menorrhagia that wasn't controlled on daily aygestin (didn't tolerate myfembree).  She is doing the better with the IUD. She has spotting most days. At the most she could wear one pad in a day (on the heavier days).   GYNECOLOGIC HISTORY: No LMP recorded. (Menstrual status: IUD). Contraception:IUD  Menopausal hormone therapy: none         OB History     Gravida  4   Para  4   Term  2   Preterm  2   AB      Living  3      SAB      IAB      Ectopic      Multiple      Live Births  4              Patient Active Problem List   Diagnosis Date Noted   IDA (iron deficiency anemia) 02/13/2021    Past Medical History:  Diagnosis Date   Anemia    Hyperlipidemia    Hypertension    Menorrhagia    Prediabetes    SVT (supraventricular tachycardia)    Ulcerative colitis ([place])    Vitamin D deficiency     Past Surgical History:  Procedure Laterality Date   CESAREAN SECTION     TUBAL LIGATION      Current Outpatient Medications  Medication Sig Dispense Refill   amLODipine (NORVASC) 5 MG tablet TAKE 1 TABLET BY MOUTH ONCE DAILY . APPOINTMENT REQUIRED FOR FUTURE REFILLS 30 tablet 2   Blood Pressure Monitor KIT Dispense based on patient and insurance preference. To check blood pressure as needed. 1 kit 0   metoprolol succinate (TOPROL-XL) 50 MG 24 hr tablet Take 1 tablet (50 mg total) by mouth daily. 30 tablet 2   norethindrone (AYGESTIN) 5 MG tablet Take one tablet a day, increase to BID as needed. 60 tablet 1   No current facility-administered medications for this visit.     ALLERGIES: Patient has no known allergies.  Family History  Problem Relation Age of Onset   Hypertension Mother    Lung cancer  Father     Social History   Socioeconomic History   Marital status: Significant Other    Spouse name: Not on file   Number of children: Not on file   Years of education: Not on file   Highest education level: Not on file  Occupational History   Not on file  Tobacco Use   Smoking status: Never   Smokeless tobacco: Never  Vaping Use   Vaping Use: Never used  Substance and Sexual Activity   Alcohol use: Not Currently   Drug use: No   Sexual activity: Not Currently    Partners: Male    Birth control/protection: Abstinence, Pill  Other Topics Concern   Not on file  Social History Narrative   Not on file   Social Determinants of Health   Financial Resource Strain: Not on file  Food Insecurity: Not on file  Transportation Needs: Not on file  Physical Activity: Not on file  Stress: Not on file  Social Connections: Not on file  Intimate Partner Violence: Not on file  Review of Systems  All other systems reviewed and are negative.   PHYSICAL EXAMINATION:    BP 132/70   Pulse 86   Wt 183 lb (83 kg)   SpO2 100%   BMI 32.94 kg/m     General appearance: alert, cooperative and appears stated age  Pelvic: External genitalia:  no lesions              Urethra:  normal appearing urethra with no masses, tenderness or lesions              Bartholins and Skenes: normal                 Vagina: normal appearing vagina with normal color and discharge, no lesions              Cervix: no lesions and IUD strings 5+ cm, trimmed to 4 cm              Bimanual Exam:  Uterus:   anteverted, mobile, ~10-12 week sized, not tender              Adnexa: no mass, fullness, tenderness               Chaperone was present for exam.  1. IUD check up Bleeding has improved with the IUD, now spotting to light bleeding daily She will try going off of the Aygestin, call with increase in bleeding She will restart the Aygestin if her bleeding is heavy.   2. Abnormal uterine bleeding  (AUB) Improved, daily light bleeding - CBC - Ferritin  3. Iron deficiency anemia due to chronic blood loss - CBC - Ferritin

## 2022-07-24 LAB — CBC
HCT: 36.3 % (ref 35.0–45.0)
Hemoglobin: 11.8 g/dL (ref 11.7–15.5)
MCH: 27.4 pg (ref 27.0–33.0)
MCHC: 32.5 g/dL (ref 32.0–36.0)
MCV: 84.4 fL (ref 80.0–100.0)
MPV: 10.2 fL (ref 7.5–12.5)
Platelets: 426 10*3/uL — ABNORMAL HIGH (ref 140–400)
RBC: 4.3 10*6/uL (ref 3.80–5.10)
RDW: 13.1 % (ref 11.0–15.0)
WBC: 6.3 10*3/uL (ref 3.8–10.8)

## 2022-07-24 LAB — FERRITIN: Ferritin: 4 ng/mL — ABNORMAL LOW (ref 16–232)

## 2022-07-26 ENCOUNTER — Ambulatory Visit: Payer: BC Managed Care – PPO | Admitting: Family

## 2022-07-26 ENCOUNTER — Ambulatory Visit: Payer: Self-pay | Admitting: Family

## 2022-07-29 ENCOUNTER — Other Ambulatory Visit: Payer: Self-pay | Admitting: Gastroenterology

## 2022-08-14 ENCOUNTER — Telehealth: Payer: Self-pay

## 2022-08-14 NOTE — Telephone Encounter (Signed)
Spoke with patient and read her Dr. Gentry Fitz reply. She voiced understanding. Message sent to appt desk to schedule her a follow visit.

## 2022-08-14 NOTE — Telephone Encounter (Signed)
Please advise her to take 1 aygestin BID until the bleeding becomes light, then try to back off to 1 a day. If she is going through a pad an hour, she needs to be seen. Please schedule her for a f/u visit with me in the next few weeks. Make sure she is taking iron daily. At her last blood draw she wasn't anemic, but had low iron stores.  I think she has an appointment with Hematology next month, she should keep that appointment.

## 2022-08-14 NOTE — Telephone Encounter (Signed)
Had IUD inserted 05/25/22.  Started bleeding yesterday. Started light but got heavier as the day went on.  Saturated 4 pads today. Changes every few hours.  Heavy flow started yesterday and she took two Aygestin last night and two today and hasn't seen any change in the heavy bleeding.  She reports history of low iron level.  She wanted to see what Dr. Talbert Nan recommends.

## 2022-08-16 ENCOUNTER — Telehealth: Payer: Self-pay

## 2022-08-16 DIAGNOSIS — N920 Excessive and frequent menstruation with regular cycle: Secondary | ICD-10-CM

## 2022-08-16 NOTE — Telephone Encounter (Signed)
Follow up w/ Dr. Talbert Nan scheduled on 09/03/2022.

## 2022-08-16 NOTE — Telephone Encounter (Signed)
JJ pt calling to request refill on aygestin 5mg . Has been taking BID up until this point. But notified that JJ's instructions where for her to take BID if bleeding was heavy but if/when lightened, for her to go down to one dose daily. Pt voiced understanding.  Pt scheduled for f/u on 09/03/2022.

## 2022-08-18 ENCOUNTER — Encounter (HOSPITAL_COMMUNITY): Payer: Self-pay

## 2022-08-18 ENCOUNTER — Ambulatory Visit (HOSPITAL_COMMUNITY)
Admission: EM | Admit: 2022-08-18 | Discharge: 2022-08-18 | Disposition: A | Discharge status: Home / Self Care | Payer: BC Managed Care – PPO | Attending: Internal Medicine | Admitting: Internal Medicine

## 2022-08-18 DIAGNOSIS — N920 Excessive and frequent menstruation with regular cycle: Secondary | ICD-10-CM

## 2022-08-18 DIAGNOSIS — N3001 Acute cystitis with hematuria: Secondary | ICD-10-CM

## 2022-08-18 DIAGNOSIS — T8389XA Other specified complication of genitourinary prosthetic devices, implants and grafts, initial encounter: Secondary | ICD-10-CM | POA: Diagnosis present

## 2022-08-18 DIAGNOSIS — I10 Essential (primary) hypertension: Secondary | ICD-10-CM

## 2022-08-18 DIAGNOSIS — R42 Dizziness and giddiness: Secondary | ICD-10-CM | POA: Diagnosis present

## 2022-08-18 LAB — POCT URINALYSIS DIPSTICK, ED / UC
Glucose, UA: 100 mg/dL — AB
Ketones, ur: 15 mg/dL — AB
Nitrite: POSITIVE — AB
Protein, ur: >=300 mg/dL — AB
Specific Gravity, Urine: 1.02 (ref 1.005–1.030)
Urobilinogen, UA: 4 mg/dL — ABNORMAL HIGH (ref 0.0–1.0)
pH: 5 (ref 5.0–8.0)

## 2022-08-18 LAB — POC URINE PREG, ED: Preg Test, Ur: NEGATIVE

## 2022-08-18 LAB — CBC
HCT: 35.4 % — ABNORMAL LOW (ref 36.0–46.0)
Hemoglobin: 10.8 g/dL — ABNORMAL LOW (ref 12.0–15.0)
MCH: 26.6 pg (ref 26.0–34.0)
MCHC: 30.5 g/dL (ref 30.0–36.0)
MCV: 87.2 fL (ref 80.0–100.0)
Platelets: 363 10*3/uL (ref 150–400)
RBC: 4.06 MIL/uL (ref 3.87–5.11)
RDW: 14.6 % (ref 11.5–15.5)
WBC: 5.8 10*3/uL (ref 4.0–10.5)
nRBC: 0 % (ref 0.0–0.2)

## 2022-08-18 MED ORDER — NORETHINDRONE ACETATE 5 MG PO TABS: ORAL_TABLET | ORAL | 0 refills | Status: DC

## 2022-08-18 MED ORDER — AMLODIPINE BESYLATE 5 MG PO TABS: ORAL_TABLET | ORAL | 1 refills | Status: DC

## 2022-08-18 MED ORDER — SULFAMETHOXAZOLE-TRIMETHOPRIM 800-160 MG PO TABS: 1.0000 | ORAL_TABLET | Freq: Two times a day (BID) | ORAL | 0 refills | Status: AC

## 2022-08-18 MED ORDER — METOPROLOL SUCCINATE ER 50 MG PO TB24: 50.0000 mg | ORAL_TABLET | Freq: Every day | ORAL | 1 refills | Status: DC

## 2022-08-18 NOTE — ED Triage Notes (Signed)
Patient states she has fibroids and low iron. States needing iron rechecked. Patient dizzy. Onset 4 days.

## 2022-08-18 NOTE — Discharge Instructions (Addendum)
Take BP medications as prescribed, follow-up with PCP for ongoing evaluation and management of high BP as well as further refills.   Take norethindrone (Aygestin) as prescribed. Sent to pharmacy. Once bleeding lightens, take once daily. Follow-up with OB/GYN on 09/03/22 as scheduled. Continue taking iron and drink plenty of water.   Take bactrim antibiotic twice a day for 3 days to treat urinary tract infection.  Drink lots of water to stay well hydrated. Avoid drinking sugary and caffeine drinks.  If you develop any new or worsening symptoms or do not improve in the next 2 to 3 days, please return.  If your symptoms are severe, please go to the emergency room.  Follow-up with your primary care provider for further evaluation and management of your symptoms as well as ongoing wellness visits.  I hope you feel better!

## 2022-08-19 NOTE — ED Provider Notes (Signed)
[place]    CSN: [id number] Arrival date & time: 08/18/22  1128      History   Chief Complaint Chief Complaint  Patient presents with   Dizziness    HPI Haley Gonzales is a 52 y.o. female.   Patient presents to urgent care for evaluation of dizziness and worsening vaginal bleeding that started 4 days ago. Patient recently had Mirena IUD placed 2 months ago for menorrhagia and has had persistent vaginal bleeding. She was recently seen by OB/GYN for follow-up 3 weeks ago where she was found to have depleted iron with ferritin level of 4. Hemoglobin 3 weeks ago was normal at 11.8 (chart review). She has been taking iron daily for the last 3 weeks as prescribed. Reached out to OB/GYN 4 days ago and medication was sent to help stop the vaginal bleeding, however patient was unable to pick this up from the pharmacy due to problem with the prescription. She has been changing her pad every 2-3 hours and states bleeding has improved slightly in the last couple of days. Dizziness is only with standing, denies limb weakness, vision changes, headache, fever/chills, vaginal discharge/odor/itch, and recent antibiotic or steroid use. No recent nausea, vomiting, diarrhea, or abdominal pain. No pelvic pain. She is experiencing urinary frequency without dysuria, urgency, or urinary odor. Does not take anticoagulants. No heart palpitations, chest pain, shortness of breath, or worsening fatigue.  Has a follow-up appointment with OB/GYN on 09/03/22 in approximately 2 weeks.   She would also like refill of antihypertensive medications. She ran out of amlodipine and metoprolol 1 week ago and has not had antihypertensive medication in 1 week, therefore BP elevated at 160/96. HTN usually managed by PCP.    Dizziness   Past Medical History:  Diagnosis Date   Anemia    Hyperlipidemia    Hypertension    Menorrhagia    Prediabetes    SVT (supraventricular tachycardia)    Ulcerative colitis ([place])     Vitamin D deficiency     Patient Active Problem List   Diagnosis Date Noted   Essential hypertension 08/18/2022   IDA (iron deficiency anemia) 02/13/2021    Past Surgical History:  Procedure Laterality Date   CESAREAN SECTION     TUBAL LIGATION      OB History     Gravida  4   Para  4   Term  2   Preterm  2   AB      Living  3      SAB      IAB      Ectopic      Multiple      Live Births  4            Home Medications    Prior to Admission medications   Medication Sig Start Date End Date Taking? Authorizing Provider  sulfamethoxazole-trimethoprim (BACTRIM DS) 800-160 MG tablet Take 1 tablet by mouth 2 (two) times daily for 3 days. 08/18/22 08/21/22 Yes Amberleigh Gerken, Stasia Cavalier, FNP  amLODipine (NORVASC) 5 MG tablet TAKE 1 TABLET BY MOUTH ONCE DAILY . APPOINTMENT REQUIRED FOR FUTURE REFILLS 08/18/22   Talbot Grumbling, FNP  Blood Pressure Monitor KIT Dispense based on patient and insurance preference. To check blood pressure as needed. 04/30/22   Vanessa Kick, MD  metoprolol succinate (TOPROL-XL) 50 MG 24 hr tablet Take 1 tablet (50 mg total) by mouth daily. 08/18/22   Talbot Grumbling, FNP  norethindrone (AYGESTIN) 5 MG tablet Take  one tablet a day, increase to BID as needed. 08/18/22   Talbot Grumbling, FNP    Family History Family History  Problem Relation Age of Onset   Hypertension Mother    Lung cancer Father     Social History Social History   Tobacco Use   Smoking status: Never   Smokeless tobacco: Never  Vaping Use   Vaping Use: Never used  Substance Use Topics   Alcohol use: Not Currently   Drug use: No     Allergies   Patient has no known allergies.   Review of Systems Review of Systems  Neurological:  Positive for dizziness.  Per HPI   Physical Exam Triage Vital Signs ED Triage Vitals  Enc Vitals Group     BP 08/18/22 1205 (!) 160/96     Pulse Rate 08/18/22 1205 74     Resp 08/18/22 1205 16     Temp  08/18/22 1205 99 F (37.2 C)     Temp Source 08/18/22 1205 Oral     SpO2 08/18/22 1205 98 %     Weight 08/18/22 1205 182 lb 15.7 oz (83 kg)     Height 08/18/22 1205 5' 2.5" (1.588 m)     Head Circumference --      Peak Flow --      Pain Score 08/18/22 1203 4     Pain Loc --      Pain Edu? --      Excl. in [place]? --    No data found.  Updated Vital Signs BP (!) 160/96 (BP Location: Right Arm)   Pulse 74   Temp 99 F (37.2 C) (Oral)   Resp 16   Ht 5' 2.5" (1.588 m)   Wt 182 lb 15.7 oz (83 kg)   LMP 08/11/2022 (Approximate)   SpO2 98%   BMI 32.93 kg/m   Visual Acuity Right Eye Distance:   Left Eye Distance:   Bilateral Distance:    Right Eye Near:   Left Eye Near:    Bilateral Near:     Physical Exam Vitals and nursing note reviewed.  Constitutional:      Appearance: She is not ill-appearing or toxic-appearing.  HENT:     Head: Normocephalic and atraumatic.     Right Ear: Hearing, tympanic membrane, ear canal and external ear normal.     Left Ear: Hearing, tympanic membrane, ear canal and external ear normal.     Nose: Nose normal.     Mouth/Throat:     Lips: Pink.     Mouth: Mucous membranes are moist. No injury.     Tongue: No lesions. Tongue does not deviate from midline.     Palate: No mass and lesions.     Pharynx: Oropharynx is clear. Uvula midline. No pharyngeal swelling, oropharyngeal exudate, posterior oropharyngeal erythema or uvula swelling.     Tonsils: No tonsillar exudate or tonsillar abscesses.  Eyes:     General: Lids are normal. Vision grossly intact. Gaze aligned appropriately.     Extraocular Movements: Extraocular movements intact.     Conjunctiva/sclera: Conjunctivae normal.  Cardiovascular:     Rate and Rhythm: Normal rate and regular rhythm.     Heart sounds: Normal heart sounds, S1 normal and S2 normal.  Pulmonary:     Effort: Pulmonary effort is normal. No respiratory distress.     Breath sounds: Normal breath sounds and air entry. No  wheezing, rhonchi or rales.  Abdominal:     General: Abdomen  is flat. Bowel sounds are normal.     Palpations: Abdomen is soft.     Tenderness: There is no abdominal tenderness. There is no right CVA tenderness, left CVA tenderness or guarding.  Musculoskeletal:     Cervical back: Neck supple.  Lymphadenopathy:     Cervical: No cervical adenopathy.  Skin:    General: Skin is warm and dry.     Capillary Refill: Capillary refill takes less than 2 seconds.     Coloration: Skin is not pale.     Findings: No rash.     Comments: No pallor or skin tenting. Appears well hydrated.  Neurological:     General: No focal deficit present.     Mental Status: She is alert and oriented to person, place, and time. Mental status is at baseline.     Cranial Nerves: Cranial nerves 2-12 are intact. No dysarthria or facial asymmetry.     Sensory: Sensation is intact.     Motor: Motor function is intact.     Coordination: Coordination is intact.     Gait: Gait is intact.     Comments: Strength and sensation intact to bilateral upper and lower extremities (5/5). Moves all 4 extremities with normal coordination voluntarily. Non-focal neuro exam.   Psychiatric:        Mood and Affect: Mood normal.        Speech: Speech normal.        Behavior: Behavior normal.        Thought Content: Thought content normal.        Judgment: Judgment normal.      UC Treatments / Results  Labs (all labs ordered are listed, but only abnormal results are displayed) Labs Reviewed  CBC - Abnormal; Notable for the following components:      Result Value   Hemoglobin 10.8 (*)    HCT 35.4 (*)    All other components within normal limits  POCT URINALYSIS DIPSTICK, ED / UC - Abnormal; Notable for the following components:   Glucose, UA 100 (*)    Bilirubin Urine LARGE (*)    Ketones, ur 15 (*)    Hgb urine dipstick LARGE (*)    Protein, ur >=300 (*)    Urobilinogen, UA 4.0 (*)    Nitrite POSITIVE (*)    Leukocytes,Ua  LARGE (*)    All other components within normal limits  POC URINE PREG, ED    EKG   Radiology No results found.  Procedures Procedures (including critical care time)  Medications Ordered in UC Medications - No data to display  Initial Impression / Assessment and Plan / UC Course  I have reviewed the triage vital signs and the nursing notes.  Pertinent labs & imaging results that were available during my care of the patient were reviewed by me and considered in my medical decision making (see chart for details).   1. Menorrhagia due to IUD, dizziness Urine pregnancy negative. Reviewed previous notes/recommendations from OB/GYN. Dr. Talbert Nan recommends from 08/14/22 Derrek Monaco advise her to take 1 aygestin BID until the bleeding becomes light, then try to back off to 1 a day. If she is going through a pad an hour, she needs to be seen. Please schedule her for a f/u visit with me in the next few weeks. Make sure she is taking iron daily. At her last blood draw she wasn't anemic, but had low iron stores."  Updated CBC drawn today to assess for worsening anemia. Norethindrone sent to  pharmacy to be taken as directed. Follow-up with OB/GYN as scheduled 09/03/22.  2. Acute cystitis with hematuria Urinalysis shows findings consistent with urinary tract infection. Will treat with bactrim twice daily for 3 days. Urine culture is pending. Encourage increased fluid intake to stay well hydrated. No systemic symptoms or clinical concern for pyelonephritis.   3. Essential hypertension Amlodipine and metoprolol refilled and sent to pharmacy to be taken as directed. BP elevated at 160/96. No red flag signs/symptoms indicating end organ damage or need for referral to ED for emergent workup. Advised follow-up with PCP for ongoing evaluation and management of HTN. Lifestyle changes to lower BP discussed. She is agreeable with plan.   Discussed physical exam and available lab work findings in clinic with  patient.  Counseled patient regarding appropriate use of medications and potential side effects for all medications recommended or prescribed today. Discussed red flag signs and symptoms of worsening condition,when to call the PCP office, return to urgent care, and when to seek higher level of care in the emergency department. Patient verbalizes understanding and agreement with plan. All questions answered. Patient discharged in stable condition.   Final Clinical Impressions(s) / UC Diagnoses   Final diagnoses:  Menorrhagia due to intrauterine device (IUD) ([place])  Dizziness  Acute cystitis with hematuria  Essential hypertension     Discharge Instructions      Take BP medications as prescribed, follow-up with PCP for ongoing evaluation and management of high BP as well as further refills.   Take norethindrone (Aygestin) as prescribed. Sent to pharmacy. Once bleeding lightens, take once daily. Follow-up with OB/GYN on 09/03/22 as scheduled. Continue taking iron and drink plenty of water.   Take bactrim antibiotic twice a day for 3 days to treat urinary tract infection.  Drink lots of water to stay well hydrated. Avoid drinking sugary and caffeine drinks.  If you develop any new or worsening symptoms or do not improve in the next 2 to 3 days, please return.  If your symptoms are severe, please go to the emergency room.  Follow-up with your primary care provider for further evaluation and management of your symptoms as well as ongoing wellness visits.  I hope you feel better!   ED Prescriptions     Medication Sig Dispense Auth. Provider   amLODipine (NORVASC) 5 MG tablet TAKE 1 TABLET BY MOUTH ONCE DAILY . APPOINTMENT REQUIRED FOR FUTURE REFILLS 30 tablet Talbot Grumbling, FNP   metoprolol succinate (TOPROL-XL) 50 MG 24 hr tablet Take 1 tablet (50 mg total) by mouth daily. 30 tablet Joella Prince M, FNP   norethindrone (AYGESTIN) 5 MG tablet Take one tablet a day, increase to BID  as needed. 60 tablet Joella Prince M, FNP   sulfamethoxazole-trimethoprim (BACTRIM DS) 800-160 MG tablet Take 1 tablet by mouth 2 (two) times daily for 3 days. 6 tablet Talbot Grumbling, FNP      PDMP not reviewed this encounter.   Talbot Grumbling, [place] 08/19/22 2211

## 2022-08-20 ENCOUNTER — Telehealth: Payer: Self-pay | Admitting: Hematology and Oncology

## 2022-08-20 NOTE — Telephone Encounter (Signed)
Patient aware of upcoming appointments  

## 2022-09-03 ENCOUNTER — Encounter: Payer: Self-pay | Admitting: Obstetrics and Gynecology

## 2022-09-03 ENCOUNTER — Ambulatory Visit: Payer: BC Managed Care – PPO | Admitting: Obstetrics and Gynecology

## 2022-09-03 ENCOUNTER — Telehealth: Payer: Self-pay | Admitting: *Deleted

## 2022-09-03 VITALS — BP 120/74 | HR 68 | Wt 188.0 lb

## 2022-09-03 DIAGNOSIS — D500 Iron deficiency anemia secondary to blood loss (chronic): Secondary | ICD-10-CM | POA: Diagnosis not present

## 2022-09-03 DIAGNOSIS — T8332XA Displacement of intrauterine contraceptive device, initial encounter: Secondary | ICD-10-CM

## 2022-09-03 DIAGNOSIS — N920 Excessive and frequent menstruation with regular cycle: Secondary | ICD-10-CM

## 2022-09-03 DIAGNOSIS — D219 Benign neoplasm of connective and other soft tissue, unspecified: Secondary | ICD-10-CM | POA: Diagnosis not present

## 2022-09-03 MED ORDER — NORETHINDRONE ACETATE 5 MG PO TABS: ORAL_TABLET | ORAL | 1 refills | Status: DC

## 2022-09-03 NOTE — Progress Notes (Signed)
GYNECOLOGY  VISIT   HPI: 52 y.o.   Significant Other Black or African American Not Hispanic or Latino  female   [phone] with Patient's last menstrual period was 08/11/2022 (approximate).   here for f/u of AUB. She has a h/o menorrhagia, fibroids and anemia.   Baseline cycles were monthly x 4-5 days, saturating a large pad in 30 minutes for 2-3 days of her cycle.  In 6/23 she was treated with myfembree, it stopped her bleeding for 2 months, she went off of it secondary to hot flashes. Her heavy bleeding resumed.   She needed a blood transfusion in 9/23 secondary to a hgb of 6.    In 9/23 she was started on daily aygestin, 5 mg a day. Initially it helped, but started bleeding, continued to bleed on 2 aygestins a day.    On 03/05/22 She had a sonohysterogram. 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 She had an endometrial biopsy on 03/05/22 that returned with inactive endometrium with stromal progestational changes.     She was continued on daily aygestin. She called on 04/09/22 c/o a 2 week h/o spotting that had turned into a light flow. She was offered to restart myfembree at that time. She was on myfembree and was on it for a week, bleeding stopped. She went off of the myfembree because her blood pressure got very high and she developed hot flashes.   In 12/23 a mirena IUD was placed. She was counseled as to the increased risk of expulsion.   She was seen at Urgent Care on 08/18/22 c/o 4 days of heavy vaginal bleeding (changing a pad every 2-3 hours) and dizziness (notes reviewed). Hgb was 10.8, UPT negative. Since then her bleeding has stopped. She has continued on the daily iron.   GYNECOLOGIC HISTORY: Patient's last menstrual period was 08/11/2022 (approximate). Contraception:Tubal ligation Menopausal hormone therapy: no        OB History     Gravida  4   Para  4   Term  2   Preterm  2   AB      Living  3      SAB      IAB      Ectopic       Multiple      Live Births  4              Patient Active Problem List   Diagnosis Date Noted   Essential hypertension 08/18/2022   IDA (iron deficiency anemia) 02/13/2021    Past Medical History:  Diagnosis Date   Anemia    Hyperlipidemia    Hypertension    Menorrhagia    Prediabetes    SVT (supraventricular tachycardia)    Ulcerative colitis ([place])    Vitamin D deficiency     Past Surgical History:  Procedure Laterality Date   CESAREAN SECTION     TUBAL LIGATION      Current Outpatient Medications  Medication Sig Dispense Refill   amLODipine (NORVASC) 5 MG tablet TAKE 1 TABLET BY MOUTH ONCE DAILY . APPOINTMENT REQUIRED FOR FUTURE REFILLS 30 tablet 1   Blood Pressure Monitor KIT Dispense based on patient and insurance preference. To check blood pressure as needed. 1 kit 0   metoprolol succinate (TOPROL-XL) 50 MG 24 hr tablet Take 1 tablet (50 mg total) by mouth daily. 30 tablet 1   norethindrone (AYGESTIN) 5 MG tablet Take one tablet a day, increase to BID as needed.  60 tablet 0   No current facility-administered medications for this visit.     ALLERGIES: Patient has no known allergies.  Family History  Problem Relation Age of Onset   Hypertension Mother    Lung cancer Father     Social History   Socioeconomic History   Marital status: Significant Other    Spouse name: Not on file   Number of children: Not on file   Years of education: Not on file   Highest education level: Not on file  Occupational History   Not on file  Tobacco Use   Smoking status: Never   Smokeless tobacco: Never  Vaping Use   Vaping Use: Never used  Substance and Sexual Activity   Alcohol use: Not Currently   Drug use: No   Sexual activity: Not Currently    Partners: Male    Birth control/protection: Abstinence, Pill  Other Topics Concern   Not on file  Social History Narrative   Not on file   Social Determinants of Health   Financial Resource Strain: Not on file   Food Insecurity: Not on file  Transportation Needs: Not on file  Physical Activity: Not on file  Stress: Not on file  Social Connections: Not on file  Intimate Partner Violence: Not on file    ROS  PHYSICAL EXAMINATION:    LMP 08/11/2022 (Approximate)     General appearance: alert, cooperative and appears stated age  Pelvic: External genitalia:  no lesions              Urethra:  normal appearing urethra with no masses, tenderness or lesions              Bartholins and Skenes: normal                 Vagina: normal appearing vagina with normal color and discharge, no lesions              Cervix:  IUD partially expulsed, IUD removed with ringed forceps.                 Chaperone was present for exam.  1. Menorrhagia with regular cycle She has a full thickness myoma.  She isn't a candidate for OCP's, had worsening HTN on myfembree, has partially expulsed a IUD, she has had bleeding with daily aygestin. We have previously discussed other options, including lupron, uterine artery embolization, ultrasound ablation, myomectomy and TLH/BS. She is now interested in TLH/BS. Will refer to GYN Surgeon. Will have her take daily aygestin until her f/u - norethindrone (AYGESTIN) 5 MG tablet; Take one tablet a day, increase to BID as needed.  Dispense: 90 tablet; Refill: 1  2. Fibroids  3. Malpositioned intrauterine device (IUD), initial encounter IUD removed  4. Iron deficiency anemia due to chronic blood loss Continue on daily iron.

## 2022-09-03 NOTE — Telephone Encounter (Signed)
Referral for TLH, fibroid uterus, menorrhagia and anemia.  She just expulsed an IUD, I've restarted her on aygestin.  In the past she has bleed to the level of needing a transfusion. I don't think she should wait months for an appointment if at all possible.   Urgent Referral placed to Dr. Currie Paris.   Routing to [facility] to f/u on urgent referral.

## 2022-09-04 NOTE — Telephone Encounter (Signed)
Pt ended up going to UC on 08/18/22 for anemia signs, increased vaginal bleeding, and voiding concerns.   Seen in office on 09/03/2022--decision to refer to surgeon for consult for TLH/BS and to continue aygestin until then. Will forward encounter to provider for final review and close.

## 2022-09-06 ENCOUNTER — Ambulatory Visit: Payer: BC Managed Care – PPO | Admitting: Hematology and Oncology

## 2022-09-06 ENCOUNTER — Other Ambulatory Visit: Payer: BC Managed Care – PPO

## 2022-09-06 NOTE — Telephone Encounter (Signed)
Haley Gonzales -can you provide update on urgent referral request?

## 2022-09-09 ENCOUNTER — Inpatient Hospital Stay: Payer: BC Managed Care – PPO | Admitting: Hematology and Oncology

## 2022-09-09 ENCOUNTER — Inpatient Hospital Stay: Payer: BC Managed Care – PPO | Attending: Hematology and Oncology

## 2022-09-09 NOTE — Telephone Encounter (Signed)
Marena Chancy -I have placed a new Urgent referral to Dr. Hyacinth Meeker.  Can you contact the patient to notify and schedule.

## 2022-09-09 NOTE — Telephone Encounter (Signed)
Tried calling pt to inform but unable to lvm, not setup.

## 2022-09-09 NOTE — Telephone Encounter (Signed)
Called Dr. Odie Sera office who stated referrals are put on hold until at least June; have no available appts.

## 2022-10-08 IMAGING — DX DG CHEST 1V PORT
1 series · 1 of 1 positions shown · non-contrast
Comparison: April 19, 2020.

CLINICAL DATA: SV CT.

EXAM:
PORTABLE CHEST 1 VIEW

[chest]
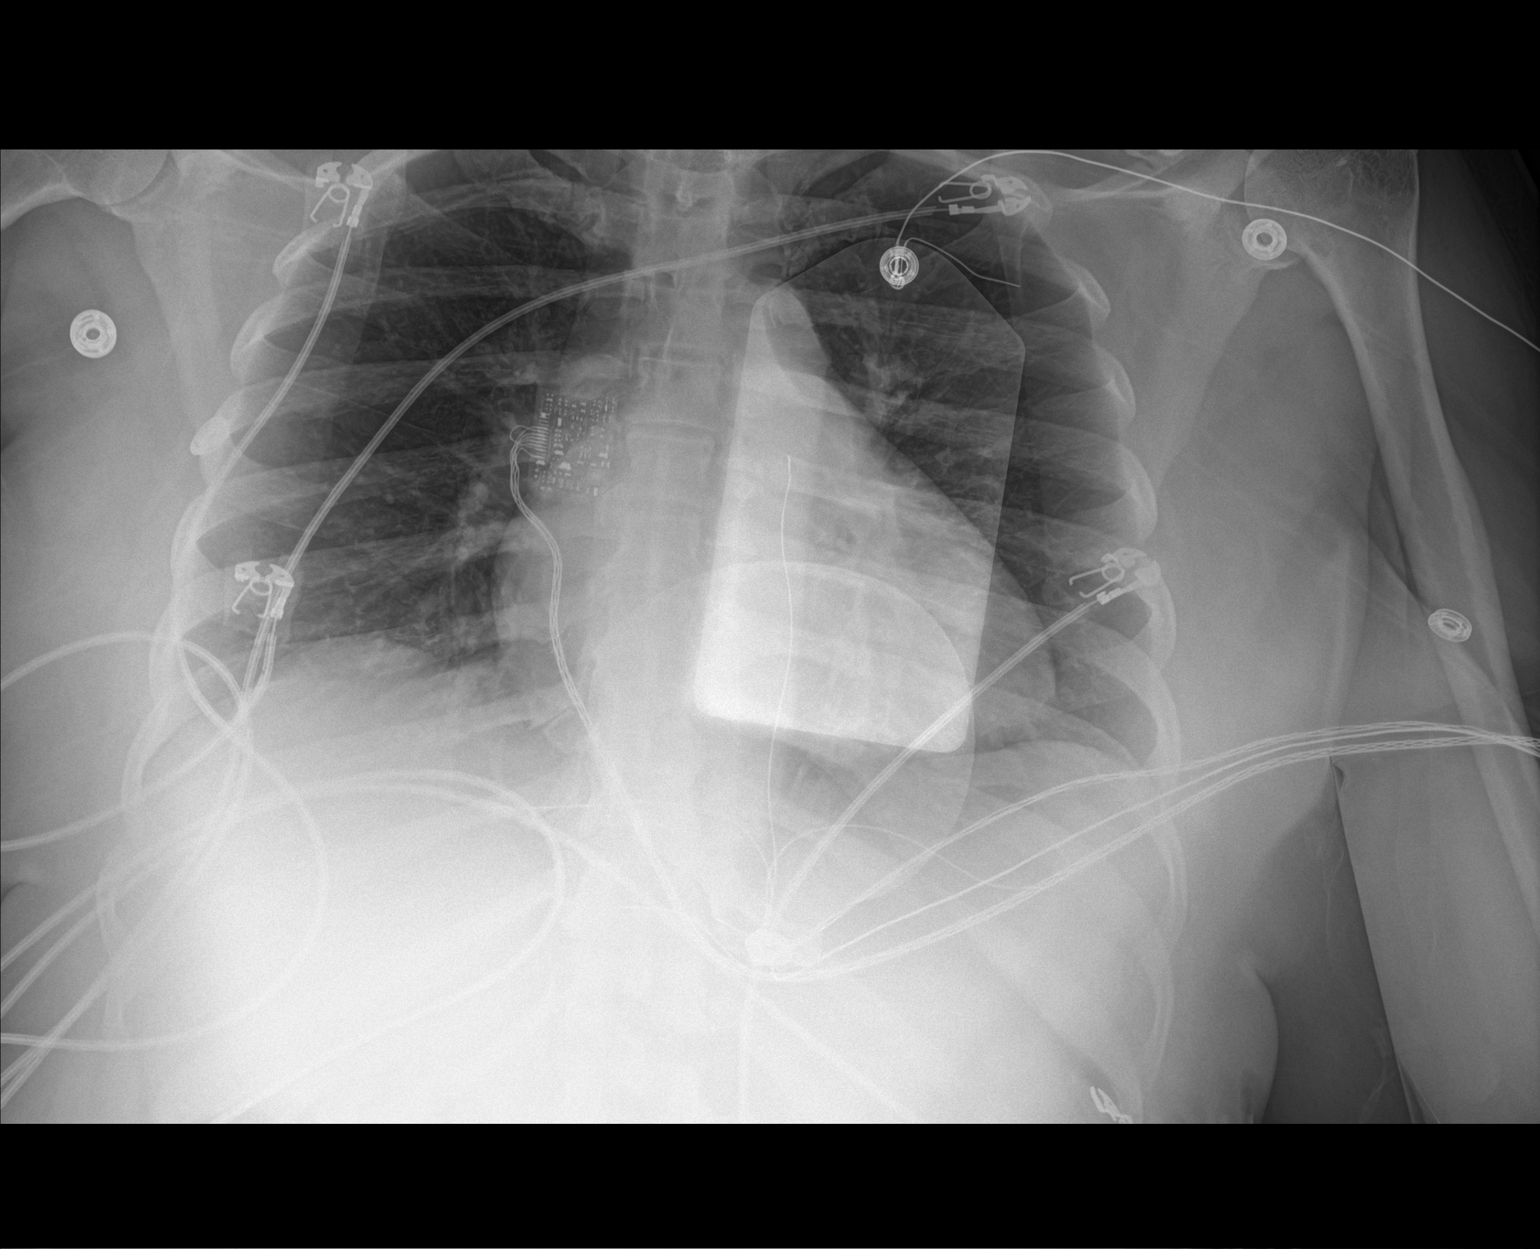

[1 of 1 positions shown; findings below may reference images not displayed]

FINDINGS: Pacer defibrillator pads project over the chest.

EKG leads project over the chest.

Cardiac size is stable accounting for decreased lung volumes with
accentuated appearance based on AP projection and portable
technique.

No lobar consolidation.

No signs of pulmonary edema or gross effusion. No visible
pneumothorax.

On limited assessment no acute skeletal findings.
IMPRESSION: Low lung volumes without acute cardiopulmonary disease.

## 2022-10-24 NOTE — Telephone Encounter (Signed)
Per review of EPIC, patient is scheduled with Dr. Briscoe Deutscher on 11/11/22.   Routing to Dr. Shirley Friar.   Cc: Bianca  Encounter closed.

## 2022-11-11 ENCOUNTER — Encounter: Payer: BC Managed Care – PPO | Admitting: Obstetrics and Gynecology

## 2022-12-08 ENCOUNTER — Emergency Department (HOSPITAL_COMMUNITY)
Admission: EM | Admit: 2022-12-08 | Discharge: 2022-12-08 | Disposition: A | Discharge status: Home / Self Care | Payer: BC Managed Care – PPO | Attending: Emergency Medicine | Admitting: Emergency Medicine

## 2022-12-08 ENCOUNTER — Other Ambulatory Visit: Payer: Self-pay

## 2022-12-08 ENCOUNTER — Encounter (HOSPITAL_COMMUNITY): Payer: Self-pay | Admitting: Emergency Medicine

## 2022-12-08 DIAGNOSIS — Z79899 Other long term (current) drug therapy: Secondary | ICD-10-CM | POA: Diagnosis not present

## 2022-12-08 DIAGNOSIS — I10 Essential (primary) hypertension: Secondary | ICD-10-CM | POA: Insufficient documentation

## 2022-12-08 DIAGNOSIS — N939 Abnormal uterine and vaginal bleeding, unspecified: Secondary | ICD-10-CM | POA: Diagnosis present

## 2022-12-08 DIAGNOSIS — D259 Leiomyoma of uterus, unspecified: Secondary | ICD-10-CM | POA: Diagnosis not present

## 2022-12-08 DIAGNOSIS — D219 Benign neoplasm of connective and other soft tissue, unspecified: Secondary | ICD-10-CM

## 2022-12-08 LAB — URINALYSIS, ROUTINE W REFLEX MICROSCOPIC
Bacteria, UA: NONE SEEN
Bilirubin Urine: NEGATIVE
Glucose, UA: NEGATIVE mg/dL
Ketones, ur: NEGATIVE mg/dL
Nitrite: NEGATIVE
Protein, ur: 100 mg/dL — AB
RBC / HPF: >50 RBC/hpf (ref 0–5)
Specific Gravity, Urine: 1.016 (ref 1.005–1.030)
WBC, UA: >50 WBC/hpf (ref 0–5)
pH: 6 (ref 5.0–8.0)

## 2022-12-08 LAB — CBC
HCT: 30.4 % — ABNORMAL LOW (ref 36.0–46.0)
Hemoglobin: 9.3 g/dL — ABNORMAL LOW (ref 12.0–15.0)
MCH: 26.1 pg (ref 26.0–34.0)
MCHC: 30.6 g/dL (ref 30.0–36.0)
MCV: 85.2 fL (ref 80.0–100.0)
Platelets: 331 10*3/uL (ref 150–400)
RBC: 3.57 MIL/uL — ABNORMAL LOW (ref 3.87–5.11)
RDW: 13.9 % (ref 11.5–15.5)
WBC: 5.6 10*3/uL (ref 4.0–10.5)
nRBC: 0 % (ref 0.0–0.2)

## 2022-12-08 LAB — HCG, SERUM, QUALITATIVE: Preg, Serum: NEGATIVE

## 2022-12-08 MED ORDER — METOPROLOL SUCCINATE ER 50 MG PO TB24: 50.0000 mg | ORAL_TABLET | Freq: Every day | ORAL | 1 refills | Status: DC

## 2022-12-08 MED ORDER — POLYSACCHARIDE IRON COMPLEX 150 MG PO CAPS: 150.0000 mg | ORAL_CAPSULE | Freq: Every day | ORAL | 0 refills | Status: AC

## 2022-12-08 NOTE — ED Triage Notes (Signed)
PT states has been on menstrual cycle for 3 days but today when going to the bathroom blood gushed out like water with blood clots. Denies any abd pain. Pt has appt with GYN on 7/17 to discuss hysterectomy due to fibroids. Pt feels weak but denies dizziness and pain.

## 2022-12-08 NOTE — ED Provider Notes (Signed)
[place] EMERGENCY DEPARTMENT AT [facility] Provider Note   CSN: [id number] Arrival date & time: 12/08/22  1032     History  Chief Complaint  Patient presents with   Vaginal Bleeding    Haley Gonzales is a 52 y.o. [phone] female who presents to ED concerned for vaginal bleeding x3 days. Patient with history of irregular/heavy bleeding, but was concerned when blood "gushed out like water" with blood clots earlier this morning. Has used 3 pads today for bleeding. Patient unsure of LMP d/t irregular periods - endorses light spotting last week. Denies abd pain, but states that she feels mildly dizzy.  Denies weakness, fatigue, dyspnea, chest pain, palpitations. Denies birth control, anticoagulant/ASA use, bleeding disorder. Denies past history of abnormal PAP. Denies sexual intercourse x1 year.       Vaginal Bleeding      Home Medications Prior to Admission medications   Medication Sig Start Date End Date Taking? Authorizing Provider  iron polysaccharides (NIFEREX) 150 MG capsule Take 1 capsule (150 mg total) by mouth daily. 12/08/22 01/07/23 Yes Diangelo Radel F, PA-C  amLODipine (NORVASC) 5 MG tablet TAKE 1 TABLET BY MOUTH ONCE DAILY . APPOINTMENT REQUIRED FOR FUTURE REFILLS 08/18/22   Carlisle Beers, FNP  Blood Pressure Monitor KIT Dispense based on patient and insurance preference. To check blood pressure as needed. 04/30/22   Mardella Layman, MD  metoprolol succinate (TOPROL-XL) 50 MG 24 hr tablet Take 1 tablet (50 mg total) by mouth daily. 12/08/22   Dorthy Cooler, PA-C  norethindrone (AYGESTIN) 5 MG tablet Take one tablet a day, increase to BID as needed. 09/03/22   Romualdo Bolk, MD      Allergies    Patient has no known allergies.    Review of Systems   Review of Systems  Genitourinary:  Positive for vaginal bleeding.    Physical Exam Updated Vital Signs BP 128/88   Pulse 75   Temp 98.4 F (36.9 C) (Oral)   Resp 16   Ht 5\' 2"   (1.575 m)   Wt 82.6 kg   LMP 12/05/2022   SpO2 99%   BMI 33.29 kg/m  Physical Exam Vitals and nursing note reviewed. Exam conducted with a chaperone present.  Constitutional:      General: She is not in acute distress.    Appearance: She is not ill-appearing or toxic-appearing.  HENT:     Head: Normocephalic and atraumatic.     Mouth/Throat:     Mouth: Mucous membranes are moist.  Eyes:     General: No scleral icterus.       Right eye: No discharge.        Left eye: No discharge.     Conjunctiva/sclera: Conjunctivae normal.  Cardiovascular:     Rate and Rhythm: Normal rate and regular rhythm.     Pulses: Normal pulses.     Heart sounds: Normal heart sounds. No murmur heard. Pulmonary:     Effort: Pulmonary effort is normal.  Abdominal:     General: Abdomen is flat. Bowel sounds are normal. There is no distension.     Palpations: Abdomen is soft. There is no mass.     Tenderness: There is no abdominal tenderness.  Genitourinary:    Comments: - External: without ulcers, lesions, or lacerations - Vaginal Vault: without laceration or foreign body - Cervix: without discoloration, friability, or masses - Os: multiparous; closed without hemorrhagic/arterial bleeding. Some blood oozing from os and pooling in vaginal vault.  Musculoskeletal:  Right lower leg: No edema.     Left lower leg: No edema.  Skin:    General: Skin is warm and dry.     Findings: No rash.  Neurological:     General: No focal deficit present.     Mental Status: She is alert. Mental status is at baseline.  Psychiatric:        Mood and Affect: Mood normal.     ED Results / Procedures / Treatments   Labs (all labs ordered are listed, but only abnormal results are displayed) Labs Reviewed  CBC - Abnormal; Notable for the following components:      Result Value   RBC 3.57 (*)    Hemoglobin 9.3 (*)    HCT 30.4 (*)    All other components within normal limits  URINALYSIS, ROUTINE W REFLEX  MICROSCOPIC - Abnormal; Notable for the following components:   Color, Urine AMBER (*)    APPearance CLOUDY (*)    Hgb urine dipstick LARGE (*)    Protein, ur 100 (*)    Leukocytes,Ua TRACE (*)    All other components within normal limits  HCG, SERUM, QUALITATIVE    EKG None  Radiology No results found.  Procedures Procedures    Medications Ordered in ED Medications - No data to display  ED Course/ Medical Decision Making/ A&P                             Medical Decision Making Amount and/or Complexity of Data Reviewed Labs: ordered.   This patient presents to the ED for concern of vaginal bleeding, this involves an extensive number of treatment options, and is a complaint that carries with it a high risk of complications and morbidity.  The differential diagnosis includes ectopic pregnancy, pregnancy termination, placental abruption, placenta previa, uterine rupture, postpartum hemorrhage, acute heavy menstrual bleeding, ruptured ovarian cyst, foreign body   Co morbidities that complicate the patient evaluation  fibroids    Lab Tests:  I Ordered, and personally interpreted labs.  The pertinent results include:  - CBC: 9.3 - UA: >50 WBC; trace leukocytes; large hGB (from vaginal bleeding) - hCG qualitative: negative   Problem List / ED Course / Critical interventions / Medication management  Patient presents to ED concern for vaginal bleeding.  Denying abdominal/pelvic pain.  Patient with history of fibroids and irregular periods-concerned today because of the large amount of bleeding and passing of clots as well.  Denies history of bleeding disorders or anticoagulant use.  Has used 3 pads in the past 4-5 hours.  Pelvic exam with some blood pooled in vaginal vault that was cleared away with suction; however, no hemorrhagic or pulsatile/arterial bleeding appreciated.  hCG qualitative negative.  CBC with anemia (9.3). I have refilled patient's iron supplements today.   UA borderline with >50 white blood cells and trace leukocytes -patient denies dysuria, increased urinary frequency, or any other urinary complaints today. Patient stating that she has an appointment with her GYN in 10 days to discuss removing her uterus for fibroid relief.  I recommend patient contact her gynecologist and try to get that appointment moved up if her bleeding does not start resolving over the next 48 to 72 hours.  Patient verbalized understanding of plan. Patient also requesting me to refill her metoprolol prescription for HTN-stating that she ran out of her medication this morning.  I have sent in metoprolol to her pharmacy. I have reviewed the  patients home medicines and have made adjustments as needed Patient afebrile with stable vitals.  Patient stating that she is ready for discharge.  Provided return precautions.  Patient discharged in good condition.  Ddx: these are considered less likely due to history of present illness and physical exam findings - ectopic pregnancy/pregnancy termination/placental abruption/placenta previa: hCG negative - postpartum hemorrhage: hCG negative - ruptured ovarian cyst: no abdominal pain - foreign body: pelvic exam without concern   Social Determinants of Health:  none          Final Clinical Impression(s) / ED Diagnoses Final diagnoses:  Vaginal bleeding  Fibroids    Rx / DC Orders ED Discharge Orders          Ordered    iron polysaccharides (NIFEREX) 150 MG capsule  Daily        12/08/22 1233    metoprolol succinate (TOPROL-XL) 50 MG 24 hr tablet  Daily        12/08/22 1233              Dorthy Cooler, [place] 12/08/22 1301    Alvira Monday, MD 12/09/22 2101

## 2022-12-08 NOTE — Discharge Instructions (Addendum)
It was a pleasure caring for you today.  Blood count showed anemia at 9.9.  We do not transfuse blood until hemoglobin reaches under 7.  I recommend following up with your gynecologist later this week.  I sent in the prescriptions requested-metoprolol and Ferrex.  Seek emergency care if experiencing any new or worsening symptoms.

## 2022-12-18 ENCOUNTER — Telehealth: Payer: Self-pay | Admitting: Obstetrics and Gynecology

## 2022-12-18 ENCOUNTER — Encounter: Payer: Self-pay | Admitting: Obstetrics and Gynecology

## 2022-12-18 ENCOUNTER — Other Ambulatory Visit: Payer: Self-pay

## 2022-12-18 ENCOUNTER — Ambulatory Visit (INDEPENDENT_AMBULATORY_CARE_PROVIDER_SITE_OTHER): Payer: BC Managed Care – PPO | Admitting: Obstetrics and Gynecology

## 2022-12-18 VITALS — BP 148/86 | HR 76 | Wt 181.1 lb

## 2022-12-18 DIAGNOSIS — D500 Iron deficiency anemia secondary to blood loss (chronic): Secondary | ICD-10-CM

## 2022-12-18 DIAGNOSIS — N939 Abnormal uterine and vaginal bleeding, unspecified: Secondary | ICD-10-CM

## 2022-12-18 MED ORDER — TRANEXAMIC ACID 650 MG PO TABS: 1300.0000 mg | ORAL_TABLET | Freq: Three times a day (TID) | ORAL | 2 refills | Status: DC

## 2022-12-18 NOTE — Progress Notes (Signed)
NEW GYNECOLOGY PATIENT Patient name: Haley Gonzales MRN [id number]  Date of birth: Mar 04, 1971 Chief Complaint:   Fibroids     History:  Haley Gonzales is a 52 y.o. [phone] being seen today for AUB/fibroids. IUD was partially expelled. Menses are heavy with clots despite prior medications and surgery is last result. Has not had pain with these menses during this last year of increased bleeding.   Had stop myfembree due to increased BP and stopped aygestin 2 weeks ago - no improvement  Works at [employer]  2 c-sections with TL during last c-section; no complications No FH of anesthesia complications On metoprolol for SVT      Gynecologic History Patient's last menstrual period was 12/05/2022 (approximate). Contraception: tubal ligation Last Pap:     Component Value Date/Time   DIAGPAP  02/15/2022 1258    - Negative for intraepithelial lesion or malignancy (NILM)   HPVHIGH Negative 02/15/2022 1258   ADEQPAP  02/15/2022 1258    Satisfactory for evaluation; transformation zone component PRESENT.    High Risk HPV: Positive  Adequacy:  Satisfactory for evaluation, transformation zone component PRESENT  Diagnosis:  Atypical squamous cells of undetermined significance (ASC-US)  Last Mammogram: none seen on file Last Colonoscopy: none seen on file  Obstetric History OB History  Gravida Para Term Preterm AB Living  4 4 2 2   3   SAB IAB Ectopic Multiple Live Births          4    # Outcome Date GA Lbr Len/2nd Weight Sex Type Anes PTL Lv  4 Preterm           3 Preterm           2 Term           1 Term             Past Medical History:  Diagnosis Date   Anemia    Hyperlipidemia    Hypertension    Menorrhagia    Prediabetes    SVT (supraventricular tachycardia)    Ulcerative colitis (HCC)    Vitamin D deficiency     Past Surgical History:  Procedure Laterality Date   CESAREAN SECTION     TUBAL LIGATION      Current Outpatient Medications on File Prior  to Visit  Medication Sig Dispense Refill   amLODipine (NORVASC) 5 MG tablet TAKE 1 TABLET BY MOUTH ONCE DAILY . APPOINTMENT REQUIRED FOR FUTURE REFILLS 30 tablet 1   iron polysaccharides (NIFEREX) 150 MG capsule Take 1 capsule (150 mg total) by mouth daily. 30 capsule 0   metoprolol succinate (TOPROL-XL) 50 MG 24 hr tablet Take 1 tablet (50 mg total) by mouth daily. 30 tablet 1   Blood Pressure Monitor KIT Dispense based on patient and insurance preference. To check blood pressure as needed. (Patient not taking: Reported on 12/18/2022) 1 kit 0   norethindrone (AYGESTIN) 5 MG tablet Take one tablet a day, increase to BID as needed. (Patient not taking: Reported on 12/18/2022) 90 tablet 1   No current facility-administered medications on file prior to visit.    No Known Allergies  Social History:  reports that she has never smoked. She has never used smokeless tobacco. She reports that she does not currently use alcohol. She reports that she does not use drugs.  Family History  Problem Relation Age of Onset   Hypertension Mother    Lung cancer Father     The following portions of the patient's  history were reviewed and updated as appropriate: allergies, current medications, past family history, past medical history, past social history, past surgical history and problem list.  Review of Systems Pertinent items noted in HPI and remainder of comprehensive ROS otherwise negative.  Physical Exam:  BP (!) 129/96   Pulse 76   Wt 181 lb 1.6 oz (82.1 kg)   LMP 12/05/2022 (Approximate)   BMI 33.12 kg/m  Physical Exam Vitals and nursing note reviewed.  Constitutional:      Appearance: Normal appearance.  Cardiovascular:     Rate and Rhythm: Normal rate.  Pulmonary:     Effort: Pulmonary effort is normal.     Breath sounds: Normal breath sounds.  Neurological:     General: No focal deficit present.     Mental Status: She is alert and oriented to person, place, and time.  Psychiatric:         Mood and Affect: Mood normal.        Behavior: Behavior normal.        Thought Content: Thought content normal.        Judgment: Judgment normal.        Assessment and Plan:   1. Abnormal uterine bleeding (AUB) Trial of TXA until hysterectomy and continue iron to improve CBC.  - tranexamic acid (LYSTEDA) 650 MG TABS tablet; Take 2 tablets (1,300 mg total) by mouth 3 (three) times daily. Take during menses for a maximum of five days  Dispense: 30 tablet; Refill: 2  2. Iron deficiency anemia due to chronic blood loss   Patient desires surgical management with RA-TLH, BS, cysto.  The risks of surgery were discussed in detail with the patient including but not limited to: bleeding which may require transfusion or reoperation; infection which may require prolonged hospitalization or re-hospitalization and antibiotic therapy; injury to bowel, bladder, ureters and major vessels or other surrounding organs which may lead to other procedures; formation of adhesions; need for additional procedures including laparotomy or subsequent procedures secondary to intraoperative injury or abnormal pathology; thromboembolic phenomenon; incisional problems and other postoperative or anesthesia complications.  Patient was told that the likelihood that her condition and symptoms will be treated effectively with this surgical management was high; the postoperative expectations were also discussed in detail. The patient also understands the alternative treatment options which were discussed in full. All questions were answered.  She was told that she will be contacted by our surgical scheduler regarding the time and date of her surgery; routine preoperative instructions will be given to her by the preoperative nursing team.    Printed patient education handouts about the procedure were given to the patient to review at home.   Routine preventative health maintenance measures emphasized. Please refer to After  Visit Summary for other counseling recommendations.   Follow-up: No follow-ups on file.      Lorriane Shire, MD Obstetrician & Gynecologist, Faculty [facility] [employer], [facility]

## 2022-12-18 NOTE — Telephone Encounter (Addendum)
   Request for surgical clearance:  What type of surgery is being performed? Laparoscopic hysterectomy, bilateral salpingectomy, cystoscopy   When is this surgery scheduled? TBD   Are there any medications that need to be held prior to surgery and how long? no   Name of physician performing surgery? Lorriane Shire, MD   What is the office phone and fax number? Phone:  [phone]   Fax:  [phone]   ____________________________________________________   For Provider: patient with history of SVT

## 2022-12-18 NOTE — Patient Instructions (Addendum)
Hysterectomy Information  A hysterectomy is a surgery to take out the womb (uterus) or the womb and the lowest part of the womb (cervix), which opens into the vagina. The ovaries, the fallopian tubes, or both may also be taken out. After the procedure, a woman will no longer have menstrual periods and will not be able to get pregnant. What are the benefits? This surgery may improve your quality of life by relieving pain and heavy vaginal bleeding. This surgery may also: Treat long-term (chronic) infection in the area between your hip bones (pelvis). Treat conditions that affect the womb. Treat cancer of the womb or cervix. Lower the risk of cancer. It may also be done for transgender men to match their gender identity. What are the risks? Generally, this surgery is safe. But problems can happen, including: Bleeding. Needing donated blood (transfusion). Blood clots. Infection. Damage to nearby parts or organs. Allergic reactions to medicines. Needing to switch from a surgery that requires small cuts (incisions) to a surgery that requires a large cut. What are the different types? These are the three types: The top part of the womb is taken out, but not the cervix. The womb and cervix are taken out. The womb, the cervix, and the tissue that holds the womb in place are taken out. What happens during the procedure? This surgery may be done in one of these ways: A cut is made in the belly (abdomen). The womb is taken out through the opening. A cut is made in the vagina. The womb is taken out through the opening that is made. A device with a camera to help see is put through one of 3 or 4 cuts made in the belly. The womb is taken out through the vagina. A device with a camera to help see is put through one of 3 or 4 cuts made in the belly. The womb is cut into pieces and taken out through the openings or through the vagina. A computer helps control the surgical tools put into 3 or 4 cuts  made in the belly. The womb is cut into small pieces. The pieces are taken out through the openings or through the vagina. Talk with your doctor to see which is the right one for you. The procedures may vary among doctors and hospitals. What happens after the procedure? You will be given pain medicine. You may need to stay in the hospital for 1-2 days. You may need to have a responsible adult stay with you for a few days after you go home. If your ovaries were taken out, you may get hot flashes, have night sweats, and have trouble sleeping. Talk with your doctor about how often you need Pap tests. Keep all follow-up visits. Questions to ask your doctor Is this surgery needed? What other options do I have? What organs need to be taken out? How long will I need to stay in the hospital? How long will it take to get better at home? What symptoms can I expect after the procedure? Summary A hysterectomy is a surgery to take out your womb. This may be done to treat conditions. After this surgery, you will not have periods and you cannot get pregnant. There are different types of this surgery. Talk with your doctor about which is best for you. This surgery is safe, but there are some risks. This information is not intended to replace advice given to you by your health care provider. Make sure you discuss any questions   you have with your health care provider. Document Revised: 03/15/2020 Document Reviewed: 03/15/2020 Elsevier Patient Education  2024 [employer]   

## 2023-01-15 ENCOUNTER — Ambulatory Visit (HOSPITAL_COMMUNITY)
Admission: EM | Admit: 2023-01-15 | Discharge: 2023-01-15 | Disposition: A | Discharge status: Home / Self Care | Payer: BC Managed Care – PPO | Attending: Emergency Medicine | Admitting: Emergency Medicine

## 2023-01-15 ENCOUNTER — Encounter (HOSPITAL_COMMUNITY): Payer: Self-pay

## 2023-01-15 DIAGNOSIS — R5383 Other fatigue: Secondary | ICD-10-CM

## 2023-01-15 DIAGNOSIS — D509 Iron deficiency anemia, unspecified: Secondary | ICD-10-CM | POA: Diagnosis present

## 2023-01-15 DIAGNOSIS — R531 Weakness: Secondary | ICD-10-CM | POA: Diagnosis present

## 2023-01-15 LAB — COMPREHENSIVE METABOLIC PANEL
ALT: 12 U/L (ref 0–44)
AST: 16 U/L (ref 15–41)
Albumin: 3.2 g/dL — ABNORMAL LOW (ref 3.5–5.0)
Alkaline Phosphatase: 68 U/L (ref 38–126)
Anion gap: 7 (ref 5–15)
BUN: 8 mg/dL (ref 6–20)
CO2: 23 mmol/L (ref 22–32)
Calcium: 8.2 mg/dL — ABNORMAL LOW (ref 8.9–10.3)
Chloride: 104 mmol/L (ref 98–111)
Creatinine, Ser: 0.94 mg/dL (ref 0.44–1.00)
GFR, Estimated: >60 mL/min (ref >60)
Glucose, Bld: 99 mg/dL (ref 70–99)
Potassium: 3.7 mmol/L (ref 3.5–5.1)
Sodium: 134 mmol/L — ABNORMAL LOW (ref 135–145)
Total Bilirubin: 0.3 mg/dL (ref 0.3–1.2)
Total Protein: 6.9 g/dL (ref 6.5–8.1)

## 2023-01-15 NOTE — ED Triage Notes (Signed)
Pt presents with generalized weakness. Pt has fibroids and had a very heavy period recently. Her period stopped less than a weak ago. Would like to have blood work done to check.

## 2023-01-15 NOTE — Discharge Instructions (Addendum)
We have obtained labs today we will contact you if anything is emergent.  Please follow-up with your primary care provider regarding further management of your anemia.  If you develop any syncope, or worsening of symptoms, please seek immediate care at the nearest emergency department.

## 2023-01-15 NOTE — ED Provider Notes (Signed)
[facility]    CSN: [id number] Arrival date & time: 01/15/23  1920      History   Chief Complaint Chief Complaint  Patient presents with   Weakness    HPI Haley Gonzales is a 52 y.o. female.   Patient presents to clinic for generalized weakness and fatigue.  She does have known uterine fibroids and recently had a very heavy menstrual cycle that ended within the past week.  She would like to have her blood work done to recheck her hemoglobin and iron levels.  She denies blood in her stool or in her urine.  Denies any fever, cough, body aches or recent sick contacts.  Reports she has had a blood transfusion in the past.    The history is provided by the patient and medical records.  Weakness Associated symptoms: no chest pain, no cough and no fever     Past Medical History:  Diagnosis Date   Anemia    Hyperlipidemia    Hypertension    Menorrhagia    Prediabetes    SVT (supraventricular tachycardia)    Ulcerative colitis (HCC)    Vitamin D deficiency     Patient Active Problem List   Diagnosis Date Noted   Essential hypertension 08/18/2022   IDA (iron deficiency anemia) 02/13/2021    Past Surgical History:  Procedure Laterality Date   CESAREAN SECTION     TUBAL LIGATION      OB History     Gravida  4   Para  4   Term  2   Preterm  2   AB      Living  3      SAB      IAB      Ectopic      Multiple      Live Births  4            Home Medications    Prior to Admission medications   Medication Sig Start Date End Date Taking? Authorizing Provider  amLODipine (NORVASC) 5 MG tablet TAKE 1 TABLET BY MOUTH ONCE DAILY . APPOINTMENT REQUIRED FOR FUTURE REFILLS 08/18/22   Carlisle Beers, FNP  Blood Pressure Monitor KIT Dispense based on patient and insurance preference. To check blood pressure as needed. Patient not taking: Reported on 12/18/2022 04/30/22   Mardella Layman, MD  iron polysaccharides (NIFEREX) 150 MG  capsule Take 1 capsule (150 mg total) by mouth daily. 12/08/22 01/07/23  Dorthy Cooler, PA-C  metoprolol succinate (TOPROL-XL) 50 MG 24 hr tablet Take 1 tablet (50 mg total) by mouth daily. 12/08/22   Dorthy Cooler, PA-C  tranexamic acid (LYSTEDA) 650 MG TABS tablet Take 2 tablets (1,300 mg total) by mouth 3 (three) times daily. Take during menses for a maximum of five days 12/18/22   Lorriane Shire, MD    Family History Family History  Problem Relation Age of Onset   Hypertension Mother    Lung cancer Father     Social History Social History   Tobacco Use   Smoking status: Never   Smokeless tobacco: Never  Vaping Use   Vaping status: Never Used  Substance Use Topics   Alcohol use: Not Currently   Drug use: No     Allergies   Patient has no known allergies.   Review of Systems Review of Systems  Constitutional:  Positive for fatigue. Negative for fever.  Respiratory:  Negative for cough.   Cardiovascular:  Negative for chest pain.  Gastrointestinal:  Negative for anal bleeding and blood in stool.  Genitourinary:  Negative for hematuria.  Neurological:  Positive for weakness.     Physical Exam Triage Vital Signs ED Triage Vitals  Encounter Vitals Group     BP 01/15/23 2002 (!) 150/89     Systolic BP Percentile --      Diastolic BP Percentile --      Pulse Rate 01/15/23 2002 82     Resp 01/15/23 2002 18     Temp 01/15/23 2002 99.3 F (37.4 C)     Temp src --      SpO2 01/15/23 2002 95 %     Weight --      Height --      Head Circumference --      Peak Flow --      Pain Score 01/15/23 2001 0     Pain Loc --      Pain Education --      Exclude from Growth Chart --    No data found.  Updated Vital Signs BP (!) 150/89   Pulse 82   Temp 99.3 F (37.4 C)   Resp 18   LMP 01/06/2023 (Approximate)   SpO2 95%   Visual Acuity Right Eye Distance:   Left Eye Distance:   Bilateral Distance:    Right Eye Near:   Left Eye Near:    Bilateral  Near:     Physical Exam Vitals and nursing note reviewed.  Constitutional:      Appearance: Normal appearance.  HENT:     Head: Normocephalic and atraumatic.     Right Ear: External ear normal.     Left Ear: External ear normal.     Nose: Nose normal.     Mouth/Throat:     Mouth: Mucous membranes are moist.  Eyes:     Conjunctiva/sclera: Conjunctivae normal.  Cardiovascular:     Rate and Rhythm: Normal rate and regular rhythm.     Heart sounds: Normal heart sounds. No murmur heard. Pulmonary:     Effort: Pulmonary effort is normal. No respiratory distress.     Breath sounds: Normal breath sounds.  Musculoskeletal:        General: Normal range of motion.  Skin:    General: Skin is warm and dry.  Neurological:     General: No focal deficit present.     Mental Status: She is alert and oriented to person, place, and time.  Psychiatric:        Mood and Affect: Mood normal.        Behavior: Behavior normal.      UC Treatments / Results  Labs (all labs ordered are listed, but only abnormal results are displayed) Labs Reviewed  COMPREHENSIVE METABOLIC PANEL  CBC WITH DIFFERENTIAL/PLATELET    EKG   Radiology No results found.  Procedures Procedures (including critical care time)  Medications Ordered in UC Medications - No data to display  Initial Impression / Assessment and Plan / UC Course  I have reviewed the triage vital signs and the nursing notes.  Pertinent labs & imaging results that were available during my care of the patient were reviewed by me and considered in my medical decision making (see chart for details).  Vitals and triage reviewed, patient is hemodynamically stable.  Reports generalized fatigue and a history of IDA.  Will check CBC and CMP.  No active bleeding.  Lower eyelids are quite pale. Staff will contact patient if anything results  as emergent requiring transfusion or immediate follow-up. POC, f/u care and return precautions given, no  questions at this time. Work note provided.     Final Clinical Impressions(s) / UC Diagnoses   Final diagnoses:  Weakness  Other fatigue  Iron deficiency anemia, unspecified iron deficiency anemia type     Discharge Instructions      We have obtained labs today we will contact you if anything is emergent.  Please follow-up with your primary care provider regarding further management of your anemia.  If you develop any syncope, or worsening of symptoms, please seek immediate care at the nearest emergency department.     ED Prescriptions   None    PDMP not reviewed this encounter.   Pattrick Bady, [place], [place] 01/15/23 2029

## 2023-01-16 ENCOUNTER — Emergency Department (HOSPITAL_COMMUNITY)
Admission: EM | Admit: 2023-01-16 | Discharge: 2023-01-16 | Disposition: A | Discharge status: Home / Self Care | Payer: BC Managed Care – PPO | Attending: Emergency Medicine | Admitting: Emergency Medicine

## 2023-01-16 ENCOUNTER — Telehealth (HOSPITAL_COMMUNITY): Payer: Self-pay | Admitting: Emergency Medicine

## 2023-01-16 ENCOUNTER — Other Ambulatory Visit: Payer: Self-pay

## 2023-01-16 DIAGNOSIS — R799 Abnormal finding of blood chemistry, unspecified: Secondary | ICD-10-CM | POA: Diagnosis present

## 2023-01-16 DIAGNOSIS — D649 Anemia, unspecified: Secondary | ICD-10-CM | POA: Diagnosis not present

## 2023-01-16 LAB — PREPARE RBC (CROSSMATCH)

## 2023-01-16 LAB — CBC
HCT: 24.5 % — ABNORMAL LOW (ref 36.0–46.0)
Hemoglobin: 6.9 g/dL — CL (ref 12.0–15.0)
MCH: 22.9 pg — ABNORMAL LOW (ref 26.0–34.0)
MCHC: 28.2 g/dL — ABNORMAL LOW (ref 30.0–36.0)
MCV: 81.4 fL (ref 80.0–100.0)
Platelets: 520 10*3/uL — ABNORMAL HIGH (ref 150–400)
RBC: 3.01 MIL/uL — ABNORMAL LOW (ref 3.87–5.11)
RDW: 15.9 % — ABNORMAL HIGH (ref 11.5–15.5)
WBC: 5.4 10*3/uL (ref 4.0–10.5)
nRBC: 0.4 % — ABNORMAL HIGH (ref 0.0–0.2)

## 2023-01-16 LAB — HCG, SERUM, QUALITATIVE: Preg, Serum: NEGATIVE

## 2023-01-16 LAB — CBC WITH DIFFERENTIAL/PLATELET
Abs Immature Granulocytes: 0.02 10*3/uL (ref 0.00–0.07)
Basophils Absolute: 0 10*3/uL (ref 0.0–0.1)
Basophils Relative: 0 %
Eosinophils Absolute: 0.2 10*3/uL (ref 0.0–0.5)
Eosinophils Relative: 3 %
HCT: 22.7 % — ABNORMAL LOW (ref 36.0–46.0)
Hemoglobin: 6.4 g/dL — CL (ref 12.0–15.0)
Immature Granulocytes: 0 %
Lymphocytes Relative: 30 %
Lymphs Abs: 1.8 10*3/uL (ref 0.7–4.0)
MCH: 22.7 pg — ABNORMAL LOW (ref 26.0–34.0)
MCHC: 28.2 g/dL — ABNORMAL LOW (ref 30.0–36.0)
MCV: 80.5 fL (ref 80.0–100.0)
Monocytes Absolute: 0.5 10*3/uL (ref 0.1–1.0)
Monocytes Relative: 9 %
Neutro Abs: 3.4 10*3/uL (ref 1.7–7.7)
Neutrophils Relative %: 58 %
Platelets: 502 10*3/uL — ABNORMAL HIGH (ref 150–400)
RBC: 2.82 MIL/uL — ABNORMAL LOW (ref 3.87–5.11)
RDW: 15.9 % — ABNORMAL HIGH (ref 11.5–15.5)
WBC: 5.9 10*3/uL (ref 4.0–10.5)
nRBC: 0 % (ref 0.0–0.2)

## 2023-01-16 LAB — COMPREHENSIVE METABOLIC PANEL
ALT: 11 U/L (ref 0–44)
AST: 18 U/L (ref 15–41)
Albumin: 3.2 g/dL — ABNORMAL LOW (ref 3.5–5.0)
Alkaline Phosphatase: 68 U/L (ref 38–126)
Anion gap: 9 (ref 5–15)
BUN: 5 mg/dL — ABNORMAL LOW (ref 6–20)
CO2: 23 mmol/L (ref 22–32)
Calcium: 8.8 mg/dL — ABNORMAL LOW (ref 8.9–10.3)
Chloride: 104 mmol/L (ref 98–111)
Creatinine, Ser: 1.14 mg/dL — ABNORMAL HIGH (ref 0.44–1.00)
GFR, Estimated: 58 mL/min — ABNORMAL LOW (ref >60)
Glucose, Bld: 108 mg/dL — ABNORMAL HIGH (ref 70–99)
Potassium: 3.3 mmol/L — ABNORMAL LOW (ref 3.5–5.1)
Sodium: 136 mmol/L (ref 135–145)
Total Bilirubin: <0.1 mg/dL — ABNORMAL LOW (ref 0.3–1.2)
Total Protein: 7 g/dL (ref 6.5–8.1)

## 2023-01-16 MED ORDER — SODIUM CHLORIDE 0.9% IV SOLUTION: Freq: Once | INTRAVENOUS | Status: AC

## 2023-01-16 MED ORDER — DIPHENHYDRAMINE HCL 25 MG PO CAPS
25.0000 mg | ORAL_CAPSULE | Freq: Once | ORAL | Status: AC
  Administered 2023-01-16: 25 mg via ORAL
  Filled 2023-01-16: qty 1

## 2023-01-16 NOTE — ED Provider Notes (Signed)
[facility] EMERGENCY DEPARTMENT AT [facility] Provider Note   CSN: [id number] Arrival date & time: 01/16/23  9518     History  Chief Complaint  Patient presents with  . Abnormal Lab    Haley Gonzales is a 52 y.o. female.  52 year old female with history of uterine fibroids, heavy menstrual cycles presents today for concern of anemia.  She states she was seen at urgent care yesterday and had blood work done which showed hemoglobin of 6.4.  She was referred to the emergency department.  Endorses some lightheadedness otherwise denies chest pain, shortness of breath, nausea, vomiting, abdominal pain, or current vaginal bleeding.  Has been transfused in the past for the same reason.  Endorses iron deficiency anemia.  The history is provided by the patient. No language interpreter was used.       Home Medications Prior to Admission medications   Medication Sig Start Date End Date Taking? Authorizing Provider  amLODipine (NORVASC) 5 MG tablet TAKE 1 TABLET BY MOUTH ONCE DAILY . APPOINTMENT REQUIRED FOR FUTURE REFILLS 08/18/22   Carlisle Beers, FNP  Blood Pressure Monitor KIT Dispense based on patient and insurance preference. To check blood pressure as needed. Patient not taking: Reported on 12/18/2022 04/30/22   Mardella Layman, MD  iron polysaccharides (NIFEREX) 150 MG capsule Take 1 capsule (150 mg total) by mouth daily. 12/08/22 01/07/23  Dorthy Cooler, PA-C  metoprolol succinate (TOPROL-XL) 50 MG 24 hr tablet Take 1 tablet (50 mg total) by mouth daily. 12/08/22   Dorthy Cooler, PA-C  tranexamic acid (LYSTEDA) 650 MG TABS tablet Take 2 tablets (1,300 mg total) by mouth 3 (three) times daily. Take during menses for a maximum of five days 12/18/22   Lorriane Shire, MD      Allergies    Patient has no known allergies.    Review of Systems   Review of Systems  Constitutional:  Positive for fatigue. Negative for chills and fever.  Respiratory:  Negative  for shortness of breath.   Gastrointestinal:  Negative for abdominal pain.  Genitourinary:  Negative for vaginal bleeding.  Neurological:  Negative for syncope, weakness and light-headedness.  All other systems reviewed and are negative.   Physical Exam Updated Vital Signs BP (!) 152/89   Pulse 70   Temp 99.2 F (37.3 C) (Oral)   Resp 16   LMP 01/06/2023 (Approximate)   SpO2 100%  Physical Exam Vitals and nursing note reviewed.  Constitutional:      General: She is not in acute distress.    Appearance: Normal appearance. She is not ill-appearing.  HENT:     Head: Normocephalic and atraumatic.     Nose: Nose normal.  Eyes:     Conjunctiva/sclera: Conjunctivae normal.  Cardiovascular:     Rate and Rhythm: Normal rate.  Pulmonary:     Effort: Pulmonary effort is normal. No respiratory distress.  Musculoskeletal:        General: No deformity. Normal range of motion.     Cervical back: Normal range of motion.  Skin:    Findings: No rash.  Neurological:     Mental Status: She is alert.     ED Results / Procedures / Treatments   Labs (all labs ordered are listed, but only abnormal results are displayed) Labs Reviewed  COMPREHENSIVE METABOLIC PANEL - Abnormal; Notable for the following components:      Result Value   Potassium 3.3 (*)    Glucose, Bld 108 (*)  BUN 5 (*)    Creatinine, Ser 1.14 (*)    Calcium 8.8 (*)    Albumin 3.2 (*)    Total Bilirubin <0.1 (*)    GFR, Estimated 58 (*)    All other components within normal limits  CBC - Abnormal; Notable for the following components:   RBC 3.01 (*)    Hemoglobin 6.9 (*)    HCT 24.5 (*)    MCH 22.9 (*)    MCHC 28.2 (*)    RDW 15.9 (*)    Platelets 520 (*)    nRBC 0.4 (*)    All other components within normal limits  HCG, SERUM, QUALITATIVE  TYPE AND SCREEN  PREPARE RBC (CROSSMATCH)    EKG None  Radiology No results found.  Procedures .Critical Care  Performed by: Marita Kansas, PA-C Authorized by:  Marita Kansas, PA-C   Critical care provider statement:    Critical care time (minutes):  30   Critical care was necessary to treat or prevent imminent or life-threatening deterioration of the following conditions:  Circulatory failure   Critical care was time spent personally by me on the following activities:  Development of treatment plan with patient or surrogate, discussions with consultants, evaluation of patient's response to treatment, examination of patient, ordering and review of laboratory studies, ordering and review of radiographic studies, ordering and performing treatments and interventions, pulse oximetry, re-evaluation of patient's condition and review of old charts     Medications Ordered in ED Medications  0.9 %  sodium chloride infusion (Manually program via Guardrails IV Fluids) (has no administration in time range)  diphenhydrAMINE (BENADRYL) capsule 25 mg (has no administration in time range)    ED Course/ Medical Decision Making/ A&P Clinical Course as of 01/16/23 1530  Thu Jan 16, 2023  1529 Hemoglobin(!!): 6.9 [AA]    Clinical Course User Index [AA] Marita Kansas, PA-C                                 Medical Decision Making Amount and/or Complexity of Data Reviewed Labs: ordered.  Risk Prescription drug management.   52 year old female with past medical history of iron deficiency anemia, heavy menstrual cycles, uterine fibroids presents today for hemoglobin of 6.4.  This was noted at urgent care.  Endorses fatigue no other complaints.  CMP reveals potassium of 3.3, renal insufficiency with creatinine of 1.14 otherwise without acute concerns on CMP.  CBC reveals hemoglobin of 6.9, no other acute concerns.  Will give 1 unit PRBC transfusion.  Risk and benefits discussed with patient.  She is agreeable for transfusion.  Discussed following up with PCP to be reevaluated early next week.  She is in agreement.  She is otherwise stable for discharge.   Final  Clinical Impression(s) / ED Diagnoses Final diagnoses:  Symptomatic anemia    Rx / DC Orders ED Discharge Orders     None         Marita Kansas, PA-C 01/16/23 1514    Elayne Snare K, DO 01/16/23 1556

## 2023-01-16 NOTE — Telephone Encounter (Signed)
Called pt and 2 identifier completed. Spoke with patient about abnormal lab value. Expressed that pt would need to be seen in ER. Pt expressed that she has had low levels in the past and needed tranfusion. Pt understands plan of care.

## 2023-01-16 NOTE — ED Notes (Signed)
Discharge teaching reviewed with patient. Pt verbalized understanding. Pt in no acute distress at time of discharge. Pt a&ox4

## 2023-01-16 NOTE — Discharge Instructions (Signed)
Your hemoglobin today was 6.9.  You received a blood transfusion for 1 unit.  Follow-up with your primary care doctor to have your blood work repeated and reevaluated early next week like Monday.  Any concerning symptoms return to the emergency room.

## 2023-01-16 NOTE — Telephone Encounter (Signed)
Patient is being sent postdischarge from the Urgent Care and sent to the Emergency Department via private vehicle . Per [place], APP, patient is in need of higher level of care due to critically low hemoglobin and symptomatic.   Have attempted to reach patient, number on file rings to a female voicemail that is full, called alternate contact, left voicemail for return call

## 2023-01-16 NOTE — ED Triage Notes (Signed)
Patient with hx of anemia and blood transfusions here for further eval of hgb result of 6.4 yesterday at [facility]. Just finished her period and has heavy cycles d/t fibroids. Generalized weakness x2 days.

## 2023-01-17 LAB — TYPE AND SCREEN
ABO/RH(D): B POS
Antibody Screen: NEGATIVE
Unit division: 0

## 2023-01-17 LAB — BPAM RBC
Blood Product Expiration Date: 202409032359
ISSUE DATE / TIME: 202408151305
Unit Type and Rh: 7300

## 2023-02-14 ENCOUNTER — Telehealth: Payer: Self-pay

## 2023-02-14 NOTE — Telephone Encounter (Signed)
Called patient to schedule surgery with Dr. Briscoe Deutscher. Received a female voicemail. I didn't leave a voicemail message due to uncertainty.

## 2023-02-27 NOTE — Telephone Encounter (Signed)
Name: Messina Marren  DOB: [birth year]  MRN: [id number]  Primary Cardiologist: Nicki Guadalajara, MD  Chart reviewed as part of pre-operative protocol coverage. Because of Avryl Sinibaldi past medical history and time since last visit, she will require a follow-up in-office visit in order to better assess preoperative cardiovascular risk.  Pre-op covering staff: - Please schedule appointment and call patient to inform them. If patient already had an upcoming appointment within acceptable timeframe, please add "pre-op clearance" to the appointment notes so provider is aware. - Please contact requesting surgeon's office via preferred method (i.e, phone, fax) to inform them of need for appointment prior to surgery.   [place], [place]  02/27/2023, 1:39 PM

## 2023-02-27 NOTE — Telephone Encounter (Signed)
Tried to call the pt to schedule an appt in office for pre op clearance. Pt's vm is full, could not leave message. I will send a message as FYI to Careers adviser.

## 2023-03-07 NOTE — Telephone Encounter (Signed)
2nd attempt to reach the pt. VM is still full could not leave message to call back. Pt needs an IN OFFICE APPT FOR RPE OP CLEARANCE.   I will send FYI again to the surgeon.

## 2023-03-14 NOTE — Telephone Encounter (Signed)
Pt has been scheduled in office appt to see Joni Reining, DNP 1:55 03/21/23 at the [facility] office. I will update all parties involved.

## 2023-03-17 ENCOUNTER — Telehealth: Payer: Self-pay

## 2023-03-17 NOTE — Telephone Encounter (Signed)
Third attempt to schedule patient procedure w/ Dr. Briscoe Deutscher. Patient is not available on 05/06/23 for surgery. Patient will wait for Dr. Odie Sera January schedule.

## 2023-03-21 ENCOUNTER — Ambulatory Visit: Payer: BC Managed Care – PPO | Attending: Adult Health | Admitting: Adult Health

## 2023-03-21 NOTE — Progress Notes (Deleted)
  Cardiology Office Note:  .   Date:  03/21/2023  ID:  Haley Gonzales, DOB 03/01/1971, MRN [id number] PCP: No primary care provider on file.  [facility] Providers Cardiologist:  Nicki Guadalajara, MD { }   History of Present Illness: .   Haley Gonzales is a 52 y.o. female here today for preoperative evaluation for laparoscopic hysterectomy, bilateral salpingo-oophorectomy, and cystoscopy by Dr. Lorriane Shire, on date to be determined.  The patient has not been seen since 2020.  We were following her for history of SVT, hypertension, and atypical chest discomfort.  Most recent stress test was on 01/20/2018 was low risk study results.  She was to continue metoprolol 50 mg daily for SVT.  ROS: ***  Studies Reviewed: .     ETT 01/20/2018 Blood pressure demonstrated a hypertensive response to exercise. There was no ST segment deviation noted during stress. No ST changes of ischemia. Normal exercise tolerance achieving 10.1 mets at an adequate HR response of 97% MPHR. This is a low risk study.    EKG Interpretation Date/Time:    Ventricular Rate:    PR Interval:    QRS Duration:    QT Interval:    QTC Calculation:   R Axis:      Text Interpretation:     Physical Exam:   VS:  There were no vitals taken for this visit.   Wt Readings from Last 3 Encounters:  12/18/22 181 lb 1.6 oz (82.1 kg)  12/08/22 182 lb (82.6 kg)  09/03/22 188 lb (85.3 kg)    GEN: Well nourished, well developed in no acute distress NECK: No JVD; No carotid bruits CARDIAC: ***RRR, no murmurs, rubs, gallops RESPIRATORY:  Clear to auscultation without rales, wheezing or rhonchi  ABDOMEN: Soft, non-tender, non-distended EXTREMITIES:  No edema; No deformity   ASSESSMENT AND PLAN: .   Pre-Operative Cardiac Evaluation According to the Revised Cardiac Risk Index (RCRI), her    Her   according to the [facility] Activity Status Index (DASI).       Therefore, based on ACC/AHA guidelines, patient would be at  acceptable risk for the planned procedure without further cardiovascular testing. I will route this recommendation to the requesting party via Epic fax function.   2. Hypertension  3. SVT: Dispo: ***  Signed, Bettey Mare. Liborio Nixon, ANP, AACC

## 2023-05-12 ENCOUNTER — Other Ambulatory Visit: Payer: Self-pay

## 2023-05-12 ENCOUNTER — Emergency Department (HOSPITAL_COMMUNITY)
Admission: EM | Admit: 2023-05-12 | Discharge: 2023-05-12 | Disposition: A | Discharge status: Home / Self Care | Payer: BC Managed Care – PPO | Attending: Emergency Medicine | Admitting: Emergency Medicine

## 2023-05-12 ENCOUNTER — Telehealth: Payer: Self-pay | Admitting: Obstetrics and Gynecology

## 2023-05-12 ENCOUNTER — Encounter (HOSPITAL_COMMUNITY): Payer: Self-pay

## 2023-05-12 DIAGNOSIS — N939 Abnormal uterine and vaginal bleeding, unspecified: Secondary | ICD-10-CM | POA: Diagnosis present

## 2023-05-12 DIAGNOSIS — Z79899 Other long term (current) drug therapy: Secondary | ICD-10-CM | POA: Diagnosis not present

## 2023-05-12 DIAGNOSIS — D649 Anemia, unspecified: Secondary | ICD-10-CM

## 2023-05-12 DIAGNOSIS — I10 Essential (primary) hypertension: Secondary | ICD-10-CM | POA: Insufficient documentation

## 2023-05-12 DIAGNOSIS — D62 Acute posthemorrhagic anemia: Secondary | ICD-10-CM | POA: Diagnosis not present

## 2023-05-12 LAB — CBC
HCT: 28.2 % — ABNORMAL LOW (ref 36.0–46.0)
Hemoglobin: 7.8 g/dL — ABNORMAL LOW (ref 12.0–15.0)
MCH: 20.9 pg — ABNORMAL LOW (ref 26.0–34.0)
MCHC: 27.7 g/dL — ABNORMAL LOW (ref 30.0–36.0)
MCV: 75.6 fL — ABNORMAL LOW (ref 80.0–100.0)
Platelets: 451 10*3/uL — ABNORMAL HIGH (ref 150–400)
RBC: 3.73 MIL/uL — ABNORMAL LOW (ref 3.87–5.11)
RDW: 16.7 % — ABNORMAL HIGH (ref 11.5–15.5)
WBC: 11.5 10*3/uL — ABNORMAL HIGH (ref 4.0–10.5)
nRBC: 0 % (ref 0.0–0.2)

## 2023-05-12 LAB — BASIC METABOLIC PANEL
Anion gap: 11 (ref 5–15)
BUN: 9 mg/dL (ref 6–20)
CO2: 19 mmol/L — ABNORMAL LOW (ref 22–32)
Calcium: 8.8 mg/dL — ABNORMAL LOW (ref 8.9–10.3)
Chloride: 102 mmol/L (ref 98–111)
Creatinine, Ser: 0.79 mg/dL (ref 0.44–1.00)
GFR, Estimated: >60 mL/min (ref >60)
Glucose, Bld: 114 mg/dL — ABNORMAL HIGH (ref 70–99)
Potassium: 3.5 mmol/L (ref 3.5–5.1)
Sodium: 132 mmol/L — ABNORMAL LOW (ref 135–145)

## 2023-05-12 LAB — TYPE AND SCREEN
ABO/RH(D): B POS
Antibody Screen: NEGATIVE

## 2023-05-12 LAB — HCG, QUANTITATIVE, PREGNANCY: hCG, Beta Chain, Quant, S: 1 m[IU]/mL (ref <5)

## 2023-05-12 MED ORDER — METOPROLOL SUCCINATE ER 50 MG PO TB24: 50.0000 mg | ORAL_TABLET | Freq: Every day | ORAL | 0 refills | Status: DC

## 2023-05-12 MED ORDER — AMLODIPINE BESYLATE 5 MG PO TABS
5.0000 mg | ORAL_TABLET | Freq: Once | ORAL | Status: AC
  Administered 2023-05-12: 5 mg via ORAL
  Filled 2023-05-12: qty 1

## 2023-05-12 MED ORDER — AMLODIPINE BESYLATE 5 MG PO TABS: 5.0000 mg | ORAL_TABLET | Freq: Every day | ORAL | 0 refills | Status: DC

## 2023-05-12 MED ORDER — ACETAMINOPHEN 500 MG PO TABS
1000.0000 mg | ORAL_TABLET | Freq: Once | ORAL | Status: AC
  Administered 2023-05-12: 1000 mg via ORAL
  Filled 2023-05-12: qty 2

## 2023-05-12 MED ORDER — NORETHINDRONE ACETATE 5 MG PO TABS: 10.0000 mg | ORAL_TABLET | Freq: Every day | ORAL | 0 refills | Status: DC

## 2023-05-12 NOTE — Telephone Encounter (Signed)
TC from Dr Monica Martinez @ [facility] ED  52 yo followed by Dr Briscoe Deutscher for uterine fibroids/menorrhagia/anemia who presented to ED for heavy bleeding this morning. Patient reported 1 pad every 30 minutes with h/o requiring transfusion in the past.  Patient is awaiting to be scheduled for hysterectomy. Last note in the chart with POC of TXA until surgery. Patient did not fill Rx due to possible side effects.  Pertinent medical history: BMI 31, uncontrolled HTN (patient out of medication for 2 weeks), no h/o DVT/PE but reports worsening of HTN on COC  HGB 7.7  Bleeding has normalized  Recommend Norethindrone 10 mg daily with po iron every other day Patient is a good candidate for IV iron transfusion Will reach out to Dr Briscoe Deutscher for continued medical management until surgery

## 2023-05-12 NOTE — ED Triage Notes (Signed)
Pt states she has fibroids and bleeding heavy. Pt states she has bled through two pads within an hour. Pt states while she was walking to the door she felt like she was going to pass out. Pt states she has been bleeding since yesterday. Pt states she is suppose to receive surgery soon. Pt says she had abdominal pain which she treated with 600 mg ibuprofen which eased up.

## 2023-05-12 NOTE — ED Provider Notes (Signed)
[place] [facility] DEPARTMENT AT [facility] Provider Note   CSN: [id number] Arrival date & time: 05/12/23  1213     History  Chief Complaint  Patient presents with   Vaginal Bleeding    Haley Gonzales is a 52 y.o. female.  52 year old female with a past medical history of hypertension and uterine fibroids presents here for vaginal bleeding that began yesterday.  Patient states that she is on her typical menstrual cycle.  For the last several months, her menstrual flow has been manageable.  However, today she began to have increased vaginal bleeding.  She was saturating 1 maxi pad every 30 minutes for approximately 1 to 2 hours.  She is reporting associated generalized weakness that is provoked with standing.  She also is becoming more short of breath with exertion and is typical for her.  She denies chest pain.  Does report having a headache today.  Although, she partially attributes this to being out of her blood pressure medications for the last 2 weeks.  She has had issues with heavy vaginal bleeding in the setting of uterine fibroids in the past.  She has tried taking oral contraceptives, which made her hypertensive.  She is also taken TXA.  She is not currently on any medications.  She follows with the [facility] health gynecology center in [place] for her gynecologic care.   The history is provided by the patient and medical records.       Home Medications Prior to Admission medications   Medication Sig Start Date End Date Taking? Authorizing Provider  amLODipine (NORVASC) 5 MG tablet TAKE 1 TABLET BY MOUTH ONCE DAILY . APPOINTMENT REQUIRED FOR FUTURE REFILLS 08/18/22   Carlisle Beers, FNP  Blood Pressure Monitor KIT Dispense based on patient and insurance preference. To check blood pressure as needed. Patient not taking: Reported on 12/18/2022 04/30/22   Mardella Layman, MD  iron polysaccharides (NIFEREX) 150 MG capsule Take 1 capsule (150 mg total) by mouth  daily. 12/08/22 01/07/23  Dorthy Cooler, PA-C  metoprolol succinate (TOPROL-XL) 50 MG 24 hr tablet Take 1 tablet (50 mg total) by mouth daily. 12/08/22   Dorthy Cooler, PA-C  tranexamic acid (LYSTEDA) 650 MG TABS tablet Take 2 tablets (1,300 mg total) by mouth 3 (three) times daily. Take during menses for a maximum of five days 12/18/22   Lorriane Shire, MD      Allergies    Patient has no known allergies.    Review of Systems   As noted in HPI  Physical Exam Updated Vital Signs BP (!) 154/94 (BP Location: Left Arm)   Pulse (!) 116   Temp 99.1 F (37.3 C) (Oral)   Resp 16   Ht 5\' 3"  (1.6 m)   Wt 79.4 kg   SpO2 100%   BMI 31.00 kg/m  Physical Exam Vitals reviewed.  Constitutional:      General: She is not in acute distress.    Appearance: Normal appearance. She is not ill-appearing, toxic-appearing or diaphoretic.  Eyes:     Extraocular Movements: Extraocular movements intact.     Pupils: Pupils are equal, round, and reactive to light.  Cardiovascular:     Rate and Rhythm: Normal rate and regular rhythm.     Heart sounds: Normal heart sounds. No murmur heard.    No friction rub. No gallop.  Pulmonary:     Effort: Pulmonary effort is normal. No respiratory distress.     Breath sounds: Normal breath sounds. No  wheezing, rhonchi or rales.  Abdominal:     General: There is no distension.     Palpations: Abdomen is soft.     Tenderness: There is no abdominal tenderness. There is no guarding or rebound.  Skin:    General: Skin is warm and dry.  Neurological:     Mental Status: She is alert.     Cranial Nerves: Cranial nerves 2-12 are intact. No cranial nerve deficit, dysarthria or facial asymmetry.     Sensory: Sensation is intact. No sensory deficit.     Motor: Motor function is intact. No weakness.     ED Results / Procedures / Treatments   Labs (all labs ordered are listed, but only abnormal results are displayed) Labs Reviewed  BASIC METABOLIC PANEL -  Abnormal; Notable for the following components:      Result Value   Sodium 132 (*)    CO2 19 (*)    Glucose, Bld 114 (*)    Calcium 8.8 (*)    All other components within normal limits  CBC - Abnormal; Notable for the following components:   WBC 11.5 (*)    RBC 3.73 (*)    Hemoglobin 7.8 (*)    HCT 28.2 (*)    MCV 75.6 (*)    MCH 20.9 (*)    MCHC 27.7 (*)    RDW 16.7 (*)    Platelets 451 (*)    All other components within normal limits  HCG, QUANTITATIVE, PREGNANCY  TYPE AND SCREEN    EKG None  Radiology No results found.  Procedures Procedures    Medications Ordered in ED Medications - No data to display  ED Course/ Medical Decision Making/ A&P Clinical Course as of 05/12/23 Renee Rival May 12, 2023  1610 Dr. Estanislado Pandy High dose progesterone Norethindrone 10 mg daily Message to be sent to schedule follow-up [JR]    Clinical Course User Index [JR] Rolla Flatten, MD                                 Medical Decision Making Amount and/or Complexity of Data Reviewed Labs: ordered.  Risk OTC drugs. Prescription drug management. Decision regarding hospitalization.   52 year old female presents here with increased vaginal bleeding in the setting of known uterine fibroids.  Noted to be tachycardic and hypertensive on presentation.  On exam, patient is nontoxic-appearing.  She is in no acute distress.  Cardiopulmonary exam unremarkable.  Abdomen is soft, nontender.  Reassuring neurologic exam.  On reassessment of vitals once patient was roomed, tachycardia has resolved without intervention.  Patient states that her vaginal bleeding has now improved.  Initial differential includes acute blood loss anemia,, abnormal vaginal bleeding due to uterine fibroids, electrolyte abnormality.  I independently interpreted the patient's labs.  BMP without clinically significant findings.  CBC notable for hemoglobin of 7.8 (baseline 8-9).  hCG not consistent with active pregnancy.  I  discussed the case with Dr. Estanislado Pandy, OB/GYN.  Recommends initiation of norethindrone 10 mg daily to help with vaginal bleeding.  She will also arrange close follow-up for the patient in their clinic.  Patient has been out of her amlodipine and metoprolol for the last 2 weeks.  30-day refills were sent to patient's pharmacy along with norethindrone.  I considered hospital admission.  However, patient is felt to be appropriate for outpatient management.  Thorough return precautions were discussed with the patient.  Patient discharged in stable  condition.  Patient's presentation is most consistent with acute complicated illness / injury requiring diagnostic workup.        Final Clinical Impression(s) / ED Diagnoses Final diagnoses:  Vaginal bleeding  Acute anemia    Rx / DC Orders ED Discharge Orders     None         Rolla Flatten, MD 05/12/23 6063    Glyn Ade, MD 05/13/23 1707

## 2023-05-12 NOTE — ED Provider Triage Note (Signed)
Emergency Medicine Provider Triage Evaluation Note  Haley Gonzales , a 52 y.o. female  was evaluated in triage.  Pt complains of vaginal bleeding. Started yesterday. H/o uterine fibroids. Had some abdominal pain earlier but took ibuprofen and pain resolved.  Endorses light headedness and some SOB.   Review of Systems  Positive: See above Negative: See above  Physical Exam  BP (!) 154/94 (BP Location: Left Arm)   Pulse (!) 116   Temp 99.1 F (37.3 C) (Oral)   Resp 16   Ht 5\' 3"  (1.6 m)   Wt 79.4 kg   SpO2 100%   BMI 31.00 kg/m  Gen:   Awake, no distress   Resp:  Normal effort  MSK:   Moves extremities without difficulty  Other:    Medical Decision Making  Medically screening exam initiated at 1:48 PM.  Appropriate orders placed.  [name] was informed that the remainder of the evaluation will be completed by another provider, this initial triage assessment does not replace that evaluation, and the importance of remaining in the ED until their evaluation is complete.  Work up started   Gareth Eagle, PA-C 05/12/23 1350

## 2023-05-12 NOTE — Discharge Instructions (Signed)
Dr. Odie Sera clinic will call you tomorrow to arrange a follow-up appointment in the next several days.  Refills of your amlodipine and metoprolol were sent to your pharmacy today.  A prescription for new medication, norethindrone, which is a type of birth control to help with your bleeding was also sent.  You should take 10 mg (2 tablets) daily until you follow-up with Dr. Briscoe Deutscher.  Please return to the emergency department if you develop lightheadedness, chest pain, shortness of breath, worsening vaginal bleeding similar to what you are experiencing earlier today.

## 2023-05-14 ENCOUNTER — Ambulatory Visit: Payer: BC Managed Care – PPO | Admitting: Radiology

## 2023-05-15 ENCOUNTER — Encounter: Payer: Self-pay | Admitting: Obstetrics and Gynecology

## 2023-05-15 ENCOUNTER — Other Ambulatory Visit: Payer: Self-pay

## 2023-05-15 ENCOUNTER — Telehealth: Payer: Self-pay | Admitting: *Deleted

## 2023-05-15 ENCOUNTER — Telehealth: Payer: Self-pay

## 2023-05-15 ENCOUNTER — Ambulatory Visit (INDEPENDENT_AMBULATORY_CARE_PROVIDER_SITE_OTHER): Payer: BC Managed Care – PPO | Admitting: Obstetrics and Gynecology

## 2023-05-15 ENCOUNTER — Other Ambulatory Visit: Payer: Self-pay | Admitting: Obstetrics and Gynecology

## 2023-05-15 VITALS — BP 138/83 | HR 88 | Wt 173.4 lb

## 2023-05-15 DIAGNOSIS — N939 Abnormal uterine and vaginal bleeding, unspecified: Secondary | ICD-10-CM | POA: Diagnosis not present

## 2023-05-15 DIAGNOSIS — Z1331 Encounter for screening for depression: Secondary | ICD-10-CM

## 2023-05-15 DIAGNOSIS — D500 Iron deficiency anemia secondary to blood loss (chronic): Secondary | ICD-10-CM

## 2023-05-15 MED ORDER — NORETHINDRONE ACETATE 5 MG PO TABS
10.0000 mg | ORAL_TABLET | Freq: Every day | ORAL | 2 refills | Status: DC
Start: 2023-05-15 — End: 2023-07-02

## 2023-05-15 NOTE — Progress Notes (Unsigned)
Cardiology Office Note:   Date:  05/16/2023  ID:  Haley Gonzales, DOB 09-02-1970, MRN [id number] PCP:  No primary care provider on file.  [facility] Providers Cardiologist:  Alverda Skeans, MD Referring MD: Lorriane Shire, MD  Chief Complaint/Reason for Referral: Preoperative cardiovascular evaluation ASSESSMENT:    1. Preoperative cardiovascular examination   2. SVT (supraventricular tachycardia) (HCC)   3. BMI 30.0-30.9,adult     PLAN:   In order of problems listed above: Preoperative cardiovascular evaluation: Patient can do 4 METS of activity without any issues.  She is at low risk for perioperative cardiovascular complication during low risk procedure from a cardiovascular standpoint.  Because her EKG demonstrates an anteroseptal and inferior infarction pattern will obtain an echocardiogram.  Only if the echocardiogram is severely abnormal would any further evaluation be required. SVT: Continue Toprol; EKG today demonstrates sinus rhythm today.  She is controlled with Toprol. Elevated BMI: Continue diet and exercise modification.              Dispo:  Return in about 1 year (around 05/15/2024).      Medication Adjustments/Labs and Tests Ordered: Current medicines are reviewed at length with the patient today.  Concerns regarding medicines are outlined above.  The following changes have been made:  no change   Labs/tests ordered: Orders Placed This Encounter  Procedures   EKG 12-Lead   ECHOCARDIOGRAM COMPLETE    Medication Changes: No orders of the defined types were placed in this encounter.   Current medicines are reviewed at length with the patient today.  The patient does not have concerns regarding medicines.    History of Present Illness:      FOCUSED PROBLEM LIST:   SVT Diagnosed 2018 >> txt'd w/ adenosine On Toprol Hypertension BMI 30 Iron deficiency anemia Fibroids Low risk stress test ETT 2019   12/24: The patient is a  52 year old female with above listed medical problems referred by Dr. Briscoe Deutscher for preoperative cardiovascular evaluation prior to laparoscopic hysterectomy with salpingectomy and cystoscopy.  The patient was last seen by cardiology in 2020.  At that time she was doing well.  She was seen by her OB/GYN due to increased uterine bleeding and was referred for previously listed surgery.  The patient is a non-smoker.  She works at [employer].  She is on her feet all day carrying groceries.  She denies any chest pain, shortness of breath, presyncope, or syncope.  She is able to do 4 METS of activity without any issues.  She has had no recurrence of her SVT while she has been taking Toprol.  She does not check her blood pressures on a regular basis at home.            Current Medications: Current Meds  Medication Sig   amLODipine (NORVASC) 5 MG tablet TAKE 1 TABLET BY MOUTH ONCE DAILY . APPOINTMENT REQUIRED FOR FUTURE REFILLS   Blood Pressure Monitor KIT Dispense based on patient and insurance preference. To check blood pressure as needed.   metoprolol succinate (TOPROL-XL) 50 MG 24 hr tablet Take 1 tablet (50 mg total) by mouth daily.   norethindrone (AYGESTIN) 5 MG tablet Take 2 tablets (10 mg total) by mouth daily.     Review of Systems:   Please see the history of present illness.    All other systems reviewed and are negative.     EKGs/Labs/Other Test Reviewed:   EKG: EKG from 2023 demonstrates SVT  EKG Interpretation Date/Time:  Friday May 16 2023  16:30:02 EST Ventricular Rate:  82 PR Interval:  134 QRS Duration:  68 QT Interval:  364 QTC Calculation: 425 R Axis:   17  Text Interpretation: Normal sinus rhythm Inferior infarct , age undetermined Possible Anterolateral infarct , age undetermined When compared with ECG of 21-Nov-2021 07:42, PREVIOUS ECG IS PRESENT Confirmed by Alverda Skeans ([id number]) on 05/16/2023 4:41:21 PM         Risk Assessment/Calculations:           Physical Exam:   VS:  BP 130/75   Pulse 84   Ht 5\' 3"  (1.6 m)   Wt 171 lb 9.6 oz (77.8 kg)   SpO2 96%   BMI 30.40 kg/m        Wt Readings from Last 3 Encounters:  05/16/23 171 lb 9.6 oz (77.8 kg)  05/15/23 173 lb 6.4 oz (78.7 kg)  05/12/23 175 lb (79.4 kg)      GENERAL:  No apparent distress, AOx3 HEENT:  No carotid bruits, +2 carotid impulses, no scleral icterus CAR: RRR no murmurs, gallops, rubs, or thrills RES:  Clear to auscultation bilaterally ABD:  Soft, nontender, nondistended, positive bowel sounds x 4 VASC:  +2 radial pulses, +2 carotid pulses NEURO:  CN 2-12 grossly intact; motor and sensory grossly intact PSYCH:  No active depression or anxiety EXT:  No edema, ecchymosis, or cyanosis  Signed, Orbie Pyo, MD  05/16/2023 4:51 PM    [facility] [address] [place], [place], [place]  [address] Phone: [phone]; Fax: [phone]   Note:  This document was prepared using Dragon voice recognition software and may include unintentional dictation errors.

## 2023-05-15 NOTE — Telephone Encounter (Signed)
Dr. Briscoe Deutscher, patient will be scheduled as soon as possible.  Auth Submission: NO AUTH NEEDED Site of care: Site of care: [facility] Payer: [employer] [place] Medication & CPT/J Code(s) submitted: Venofer (Iron Sucrose) J1756 Route of submission (phone, fax, portal): phone Phone # [phone] Fax # Auth type: Buy/Bill PB Units/visits requested: 500mg  x 2 doses Reference number: [id number] Approval from: 05/15/23 to 06/03/23

## 2023-05-15 NOTE — Telephone Encounter (Signed)
   Name: Haley Gonzales  DOB: 05-21-1971  MRN: [id number]  Primary Cardiologist: Nicki Guadalajara, MD  Chart reviewed as part of pre-operative protocol coverage. Because of [name] past medical history and time since last visit, she will require a follow-up in-office visit in order to better assess preoperative cardiovascular risk.Has not been seen since 2020.  Pre-op covering staff: - Please schedule appointment and call patient to inform them. If patient already had an upcoming appointment within acceptable timeframe, please add "pre-op clearance" to the appointment notes so provider is aware. - Please contact requesting surgeon's office via preferred method (i.e, phone, fax) to inform them of need for appointment prior to surgery.   Joni Reining, NP  05/15/2023, 10:46 AM

## 2023-05-15 NOTE — Telephone Encounter (Signed)
Pt needs NEW PT APPT FOR PREOP CLEARANCE. I will reach out to our scheduling team to see if they may be able to reach out to the pt with new pt appt.   Surgery is planned for 07/02/23.

## 2023-05-15 NOTE — Telephone Encounter (Signed)
   Pre-operative Risk Assessment    Patient Name: Haley Gonzales  DOB: [birth year] MRN: [id number]  DATE OF LAST VISIT: 11/16/18 DATE OF NEXT VISIT: NONE    PT WAS SCHEDULED AN APPT 03/21/23 THOUGH PT DID NOT SHOW FOR APPT.   Request for Surgical Clearance    Procedure:   XI ROBOTIC ASSISTED LAPAROSCOPIC HYSTERECTOMY  AND SALPINGECTOMY CYSTOSCOPY  Date of Surgery:  Clearance 07/02/23                                 Surgeon:  DR. Vanessa Kick Surgeon's Group or Practice Name:   Phone number:  [phone] Fax number:  [phone]   Type of Clearance Requested:   - Medical ; NONE INDICATED TO BE HELD   Type of Anesthesia:   CHOICE   Additional requests/questions:    [name]   05/15/2023, 9:25 AM

## 2023-05-15 NOTE — Progress Notes (Signed)
GYNECOLOGY VISIT  Patient name: Haley Gonzales MRN [id number]  Date of birth: [birth year] Chief Complaint:   Follow-up   History:  Haley Gonzales is a 52 y.o. [id number] being seen today for AUB followup.  Bleeding has improved with norethindrone. Worsening bleeding had prompted presentation to ED.  Was not taking anything for bleeding prior to going to the ED.   2 prior c-sections, 2 vaginal deliveries - no other abdominal procedure  Currently works at [employer] and often has to do a lot of heavy lifting and standing. Previously aware that she would be out for at least 6 weeks due to the lack of heavy lifting. Out for 4 weeks and can go back sooner. She   Past Medical History:  Diagnosis Date   Anemia    Hyperlipidemia    Hypertension    Menorrhagia    Prediabetes    SVT (supraventricular tachycardia) (HCC)    Ulcerative colitis (HCC)    Vitamin D deficiency     Past Surgical History:  Procedure Laterality Date   CESAREAN SECTION     TUBAL LIGATION      The following portions of the patient's history were reviewed and updated as appropriate: allergies, current medications, past family history, past medical history, past social history, past surgical history and problem list.   Health Maintenance:   Last pap     Component Value Date/Time   DIAGPAP  02/15/2022 1258    - Negative for intraepithelial lesion or malignancy (NILM)   HPVHIGH Negative 02/15/2022 1258   ADEQPAP  02/15/2022 1258    Satisfactory for evaluation; transformation zone component PRESENT.    High Risk HPV: Positive  Adequacy:  Satisfactory for evaluation, transformation zone component PRESENT  Diagnosis:  Atypical squamous cells of undetermined significance (ASC-US)  Last mammogram: none seen on file  Endometrial biopsy: FINAL MICROSCOPIC DIAGNOSIS:   A. ENDOMETRIUM, BIOPSY:  - Inactive endometrium with stromal progestational changes.  - Negative for hyperplasia or malignancy.    Review  of Systems:  Pertinent items are noted in HPI. Comprehensive review of systems was otherwise negative.   Objective:  Physical Exam BP 138/83   Pulse 88   Wt 173 lb 6.4 oz (78.7 kg)   BMI 30.72 kg/m    Physical Exam Vitals and nursing note reviewed.  Constitutional:      Appearance: Normal appearance.  HENT:     Head: Normocephalic and atraumatic.  Pulmonary:     Effort: Pulmonary effort is normal.  Abdominal:     Comments: Infraumbilical vertical midline incision  Skin:    General: Skin is warm and dry.  Neurological:     General: No focal deficit present.     Mental Status: She is alert.  Psychiatric:        Mood and Affect: Mood normal.        Behavior: Behavior normal.        Thought Content: Thought content normal.        Judgment: Judgment normal.      Labs and Imaging Pelvic ultrasound   Indications: menorrhagia   Findings:   Uterus 12.71 x 10.59 x 11.81 cm, anteverted Fibroids:  1) 6.19 x 4.35, suspect submucosal 2) 1.99 x 2.32 cm, anterior/subserosal 3) 2.45 x 2.36 cm, fundal/intramural 4) 1.61 x 1.19 cm, intramural 5) 3.97 x 2.91 cm, intramural/lateral   Endometrium 1.67 mm, not well seen, trace intracavitary fluid   Left ovary 3.62 x 1.67 x 2.29 cm   Right ovary  3.70 x 2.03 x 3.17 cm Simple cyst 3.58 x 2.42 cm x 1.33 cm, average 2.45 cm   No free fluid   Impression:  Anteverted fibroid uterus Multiple fibroids noted, one suspected to be intracavitary Right ovary with simple ovarian cyst Normal left ovary Sonohysterogram recommended   Narrative & Impression  Sonohysterogram & Endometrial biopsy The procedure and risks of the procedure were reviewed with the patient, consent form was signed. A speculum was placed in the vagina and the cervix was cleansed with Hibiclens. The uterine evacuator catheter was inserted into the uterine cavity without difficulty. Saline was infused under direct observation with the ultrasound. 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 The myoma measured 8.15 x 8.10 cm. The visualized portion of the endometrium measured 2.94 mm   The uterus sounded to 9 cm. The endometrial biopsy was performed, taking care to get a representative sample, sampling 360 degrees of the uterine cavity. Moderate tissue was obtained. The catheter was removed.        Assessment & Plan:   1. Abnormal uterine bleeding (AUB) (Primary) Will continue aygestin for bleeding control. Ordered for iron transfusion to help improve anemia prior to surgery, scheduled for RA-TLH, BS, cysto 1/29. Will need to reschedule cardiology appointment for SVT and preop clearance, message sent. TSH and A1c today for preop testing.   - norethindrone (AYGESTIN) 5 MG tablet; Take 2 tablets (10 mg total) by mouth daily.  Dispense: 60 tablet; Refill: 2 - TSH Rfx on Abnormal to Free T4 - HgB A1c  2. Iron deficiency anemia due to chronic blood loss Iron transfusion order placed. S/p  blood transfusion in the ED 12/9 - norethindrone (AYGESTIN) 5 MG tablet; Take 2 tablets (10 mg total) by mouth daily.  Dispense: 60 tablet; Refill: 2   Patient desires surgical management with RA-TLH, BS, cysto.  The risks of surgery were discussed in detail with the patient including but not limited to: bleeding which may require transfusion or reoperation; infection which may require prolonged hospitalization or re-hospitalization and antibiotic therapy; injury to bowel, bladder, ureters and major vessels or other surrounding organs which may lead to other procedures; formation of adhesions; need for additional procedures including laparotomy or subsequent procedures secondary to intraoperative injury or abnormal pathology; thromboembolic phenomenon; incisional problems and other postoperative or anesthesia complications.  Patient was told that the likelihood that her condition and symptoms will be treated effectively with this surgical  management was high; the postoperative expectations were also discussed in detail. The patient also understands the alternative treatment options which were discussed in full. All questions were answered.  She was told that she will be contacted by our surgical scheduler regarding the time and date of her surgery; routine preoperative instructions will be given to her by the preoperative nursing team.    She will discuss with her manager about possibly doing light duty. Discussed if not able to avoid heavy lifting, would be out of work for at least 6 weeks. Otherwise, if able to return sooner, can discuss return in 4 weeks and can return as soon as 2 if feeling well enough as long as she is not doing any heavy lifting and able to rest if needed.   Printed patient education handouts about the procedure were given to the patient to review at home.   Routine preventative health maintenance measures emphasized.  Lorriane Shire, MD Minimally Invasive Gynecologic Surgery Center for [facility], [facility]

## 2023-05-15 NOTE — Telephone Encounter (Signed)
Our scheduling team was able to get the pt a new pt appt 05/16/23 with Dr. Lynnette Caffey. I will update all parties involved.

## 2023-05-16 ENCOUNTER — Ambulatory Visit: Payer: BC Managed Care – PPO | Attending: Internal Medicine | Admitting: Internal Medicine

## 2023-05-16 ENCOUNTER — Encounter: Payer: Self-pay | Admitting: Internal Medicine

## 2023-05-16 VITALS — BP 130/75 | HR 84 | Ht 63.0 in | Wt 171.6 lb

## 2023-05-16 DIAGNOSIS — Z01810 Encounter for preprocedural cardiovascular examination: Secondary | ICD-10-CM

## 2023-05-16 DIAGNOSIS — Z6830 Body mass index (BMI) 30.0-30.9, adult: Secondary | ICD-10-CM

## 2023-05-16 DIAGNOSIS — I471 Supraventricular tachycardia, unspecified: Secondary | ICD-10-CM | POA: Diagnosis not present

## 2023-05-16 LAB — HEMOGLOBIN A1C
Est. average glucose Bld gHb Est-mCnc: 123 mg/dL
Hgb A1c MFr Bld: 5.9 % — ABNORMAL HIGH (ref 4.8–5.6)

## 2023-05-16 LAB — TSH RFX ON ABNORMAL TO FREE T4: TSH: 1.57 u[IU]/mL (ref 0.450–4.500)

## 2023-05-16 NOTE — Patient Instructions (Signed)
Medication Instructions:   Your physician recommends that you continue on your current medications as directed. Please refer to the Current Medication list given to you today.  *If you need a refill on your cardiac medications before your next appointment, please call your pharmacy*     Testing/Procedures:  Your physician has requested that you have an echocardiogram. Echocardiography is a painless test that uses sound waves to create images of your heart. It provides your doctor with information about the size and shape of your heart and how well your heart's chambers and valves are working. This procedure takes approximately one hour. There are no restrictions for this procedure. Please do NOT wear cologne, perfume, aftershave, or lotions (deodorant is allowed). Please arrive 15 minutes prior to your appointment time.  Please note: We ask at that you not bring children with you during ultrasound (echo/ vascular) testing. Due to room size and safety concerns, children are not allowed in the ultrasound rooms during exams. Our front office staff cannot provide observation of children in our lobby area while testing is being conducted. An adult accompanying a patient to their appointment will only be allowed in the ultrasound room at the discretion of the ultrasound technician under special circumstances. We apologize for any inconvenience.    Follow-Up: At [facility], you and your health needs are our priority.  As part of our continuing mission to provide you with exceptional heart care, we have created designated Provider Care Teams.  These Care Teams include your primary Cardiologist (physician) and Advanced Practice Providers (APPs -  Physician Assistants and Nurse Practitioners) who all work together to provide you with the care you need, when you need it.  We recommend signing up for the patient portal called "MyChart".  Sign up information is provided on this After Visit  Summary.  MyChart is used to connect with patients for Virtual Visits (Telemedicine).  Patients are able to view lab/test results, encounter notes, upcoming appointments, etc.  Non-urgent messages can be sent to your provider as well.   To learn more about what you can do with MyChart, go to [email or web address].    Your next appointment:   1 year(s)  Provider:   WITH AN EXTENDER IN THE OFFICE

## 2023-06-10 ENCOUNTER — Ambulatory Visit: Payer: BC Managed Care – PPO

## 2023-06-10 VITALS — BP 128/69 | HR 75 | Temp 99.4°F | Resp 20 | Ht 63.0 in | Wt 172.2 lb

## 2023-06-10 DIAGNOSIS — N939 Abnormal uterine and vaginal bleeding, unspecified: Secondary | ICD-10-CM

## 2023-06-10 DIAGNOSIS — D500 Iron deficiency anemia secondary to blood loss (chronic): Secondary | ICD-10-CM

## 2023-06-10 MED ORDER — FAMOTIDINE IN NACL 20-0.9 MG/50ML-% IV SOLN: 20.0000 mg | Freq: Once | INTRAVENOUS | Status: DC | PRN

## 2023-06-10 MED ORDER — EPINEPHRINE 0.3 MG/0.3ML IJ SOAJ: 0.3000 mg | Freq: Once | INTRAMUSCULAR | Status: DC | PRN

## 2023-06-10 MED ORDER — ACETAMINOPHEN 325 MG PO TABS
650.0000 mg | ORAL_TABLET | Freq: Once | ORAL | Status: AC
  Administered 2023-06-10: 650 mg via ORAL
  Filled 2023-06-10: qty 2

## 2023-06-10 MED ORDER — IRON SUCROSE 500 MG IVPB - SIMPLE MED
500.0000 mg | Freq: Once | INTRAVENOUS | Status: AC
  Administered 2023-06-10: 500 mg via INTRAVENOUS
  Filled 2023-06-10: qty 275

## 2023-06-10 MED ORDER — SODIUM CHLORIDE 0.9 % IV SOLN: Freq: Once | INTRAVENOUS | Status: DC | PRN

## 2023-06-10 MED ORDER — ALBUTEROL SULFATE HFA 108 (90 BASE) MCG/ACT IN AERS: 2.0000 | INHALATION_SPRAY | Freq: Once | RESPIRATORY_TRACT | Status: DC | PRN

## 2023-06-10 MED ORDER — METHYLPREDNISOLONE SODIUM SUCC 125 MG IJ SOLR
125.0000 mg | Freq: Once | INTRAMUSCULAR | Status: AC | PRN
  Administered 2023-06-10: 125 mg via INTRAVENOUS

## 2023-06-10 MED ORDER — DIPHENHYDRAMINE HCL 25 MG PO CAPS
25.0000 mg | ORAL_CAPSULE | Freq: Once | ORAL | Status: AC
  Administered 2023-06-10: 25 mg via ORAL
  Filled 2023-06-10: qty 1

## 2023-06-10 MED ORDER — DIPHENHYDRAMINE HCL 50 MG/ML IJ SOLN: 50.0000 mg | Freq: Once | INTRAMUSCULAR | Status: DC | PRN

## 2023-06-10 NOTE — Addendum Note (Signed)
 Addended by: Salli Real R on: 06/10/2023 02:52 PM   Modules accepted: Orders

## 2023-06-10 NOTE — Progress Notes (Addendum)
 Diagnosis: Iron  Deficiency Anemia  Provider:  Praveen Mannam MD  Procedure: IV Infusion  IV Type: Peripheral, IV Location: R Antecubital  Venofer  (Iron  Sucrose), Dose: 500 mg  Infusion Start Time: 0918  Infusion Stop Time: 1355  Post Infusion IV Care: Observation period completed and Peripheral IV Discontinued  Discharge: Condition: Good, Destination: Home . AVS Declined  Performed by:  Maximiano JONELLE Pouch, LPN   As patient was leaving the clinic at approximately 1433, she noted swelling in her right hand and right foot. Patient returned and emergency protocols were initiated. New PIV placed and 125 mg IV Solumedrol administered at 1443. VSS. Patient denied any other symptoms. Ordering provider (Dr. Jackquline Quarry MD) and clinic supervising provider (Dr. Lonna Coder MD) notified via secure message at 1446. Dr. Quarry responded at 1457. Stated ok to continue to monitor for 30 minutes after administration of Solumedrol, and to discharge patient as long as symptoms were improving. After 30 minutes additional observation, patient stated that symptoms were improving but had not completely resolved. VSS. Educated patient to seek emergency care if she experienced new or worsening symptoms. Patient verbalized understanding.   AVS provided to patient.

## 2023-06-12 ENCOUNTER — Telehealth: Payer: Self-pay

## 2023-06-12 NOTE — Telephone Encounter (Signed)
 Reached out to patient to confirm 07/02/23 surgery details. Patient confirmed she received the surgery letter and pre-op details via mychart and the mail in Dec 2024. Patient states she has no further questions.

## 2023-06-16 ENCOUNTER — Other Ambulatory Visit: Payer: Self-pay | Admitting: Obstetrics and Gynecology

## 2023-06-23 ENCOUNTER — Ambulatory Visit (HOSPITAL_COMMUNITY): Payer: BC Managed Care – PPO | Attending: Cardiology

## 2023-06-23 ENCOUNTER — Encounter: Payer: Self-pay | Admitting: Internal Medicine

## 2023-06-23 DIAGNOSIS — Z01810 Encounter for preprocedural cardiovascular examination: Secondary | ICD-10-CM | POA: Insufficient documentation

## 2023-06-23 LAB — ECHOCARDIOGRAM COMPLETE
Area-P 1/2: 4.21 cm2
S' Lateral: 2.5 cm

## 2023-06-24 ENCOUNTER — Telehealth: Payer: Self-pay

## 2023-06-24 ENCOUNTER — Other Ambulatory Visit: Payer: Self-pay | Admitting: Obstetrics and Gynecology

## 2023-06-24 ENCOUNTER — Ambulatory Visit: Payer: BC Managed Care – PPO

## 2023-06-24 MED ORDER — IRON SUCROSE 20 MG/ML IV SOLN: 200.0000 mg | Freq: Once | INTRAVENOUS | Status: AC

## 2023-06-24 MED ORDER — DIPHENHYDRAMINE HCL 25 MG PO CAPS: 25.0000 mg | ORAL_CAPSULE | Freq: Once | ORAL | Status: AC

## 2023-06-24 MED ORDER — ACETAMINOPHEN 325 MG PO TABS: 650.0000 mg | ORAL_TABLET | Freq: Once | ORAL | Status: AC

## 2023-06-24 NOTE — Pre-Procedure Instructions (Signed)
Surgical Instructions   Your procedure is scheduled on July 02, 2023. Report to [facility] Main Entrance "A" at 8:45 A.M., then check in with the Admitting office. Any questions or running late day of surgery: call [phone]  Questions prior to your surgery date: call [phone], Monday-Friday, 8am-4pm. If you experience any cold or flu symptoms such as cough, fever, chills, shortness of breath, etc. between now and your scheduled surgery, please notify us at the above number.     Remember:  Do not eat after midnight the night before your surgery   You may drink clear liquids until 7:45 AM the morning of your surgery.   Clear liquids allowed are: Water, Non-Citrus Juices (without pulp), Carbonated Beverages, Clear Tea (no milk, honey, etc.), Black Coffee Only (NO MILK, CREAM OR POWDERED CREAMER of any kind), and Gatorade.    Take these medicines the morning of surgery with A SIP OF WATER: amLODipine (NORVASC)  metoprolol succinate (TOPROL-XL)    One week prior to surgery, STOP taking any Aspirin (unless otherwise instructed by your surgeon) Aleve, Naproxen, Ibuprofen, Motrin, Advil, Goody's, BC's, all herbal medications, fish oil, and non-prescription vitamins.                     Do NOT Smoke (Tobacco/Vaping) for 24 hours prior to your procedure.  If you use a CPAP at night, you may bring your mask/headgear for your overnight stay.   You will be asked to remove any contacts, glasses, piercing's, hearing aid's, dentures/partials prior to surgery. Please bring cases for these items if needed.    Patients discharged the day of surgery will not be allowed to drive home, and someone needs to stay with them for 24 hours.  SURGICAL WAITING ROOM VISITATION Patients may have no more than 2 support people in the waiting area - these visitors may rotate.   Pre-op nurse will coordinate an appropriate time for 1 ADULT support person, who may not rotate, to accompany patient in  pre-op.  Children under the age of 31 must have an adult with them who is not the patient and must remain in the main waiting area with an adult.  If the patient needs to stay at the hospital during part of their recovery, the visitor guidelines for inpatient rooms apply.  Please refer to the [facility] website for the visitor guidelines for any additional information.   If you received a COVID test during your pre-op visit  it is requested that you wear a mask when out in public, stay away from anyone that may not be feeling well and notify your surgeon if you develop symptoms. If you have been in contact with anyone that has tested positive in the last 10 days please notify you surgeon.      Pre-operative CHG Bathing Instructions   You can play a key role in reducing the risk of infection after surgery. Your skin needs to be as free of germs as possible. You can reduce the number of germs on your skin by washing with CHG (chlorhexidine gluconate) soap before surgery. CHG is an antiseptic soap that kills germs and continues to kill germs even after washing.   DO NOT use if you have an allergy to chlorhexidine/CHG or antibacterial soaps. If your skin becomes reddened or irritated, stop using the CHG and notify one of our RNs at [phone].              TAKE A SHOWER THE NIGHT BEFORE  SURGERY AND THE DAY OF SURGERY    Please keep in mind the following:  DO NOT shave, including legs and underarms, 48 hours prior to surgery.   You may shave your face before/day of surgery.  Place clean sheets on your bed the night before surgery Use a clean washcloth (not used since being washed) for each shower. DO NOT sleep with pet's night before surgery.  CHG Shower Instructions:  Wash your face and private area with normal soap. If you choose to wash your hair, wash first with your normal shampoo.  After you use shampoo/soap, rinse your hair and body thoroughly to remove shampoo/soap residue.  Turn  the water OFF and apply half the bottle of CHG soap to a CLEAN washcloth.  Apply CHG soap ONLY FROM YOUR NECK DOWN TO YOUR TOES (washing for 3-5 minutes)  DO NOT use CHG soap on face, private areas, open wounds, or sores.  Pay special attention to the area where your surgery is being performed.  If you are having back surgery, having someone wash your back for you may be helpful. Wait 2 minutes after CHG soap is applied, then you may rinse off the CHG soap.  Pat dry with a clean towel  Put on clean pajamas    Additional instructions for the day of surgery: DO NOT APPLY any lotions, deodorants, cologne, or perfumes.   Do not wear jewelry or makeup Do not wear nail polish, gel polish, artificial nails, or any other type of covering on natural nails (fingers and toes) Do not bring valuables to the hospital. [facility] is not responsible for valuables/personal belongings. Put on clean/comfortable clothes.  Please brush your teeth.  Ask your nurse before applying any prescription medications to the skin.

## 2023-06-24 NOTE — Telephone Encounter (Signed)
Auth Submission: NO AUTH NEEDED Site of care: Site of care: [facility] Payer: [employer] [place] Medication & CPT/J Code(s) submitted: Venofer (Iron Sucrose) J1756 Route of submission (phone, fax, portal): phone Phone # [phone] Fax # Auth type: Buy/Bill PB Units/visits requested: 500mg  x 2 doses Reference number: [id number] Approval from: 05/15/23 to 12/01/23

## 2023-06-25 ENCOUNTER — Encounter (HOSPITAL_COMMUNITY)
Admission: RE | Admit: 2023-06-25 | Discharge: 2023-06-25 | Disposition: A | Discharge status: Home / Self Care | Payer: BC Managed Care – PPO | Source: Ambulatory Visit | Attending: Obstetrics and Gynecology | Admitting: Obstetrics and Gynecology

## 2023-06-25 ENCOUNTER — Encounter (HOSPITAL_COMMUNITY): Payer: Self-pay

## 2023-06-25 ENCOUNTER — Other Ambulatory Visit: Payer: Self-pay

## 2023-06-25 VITALS — BP 155/91 | HR 87 | Temp 98.8°F | Resp 20 | Ht 63.0 in | Wt 174.0 lb

## 2023-06-25 DIAGNOSIS — E785 Hyperlipidemia, unspecified: Secondary | ICD-10-CM | POA: Insufficient documentation

## 2023-06-25 DIAGNOSIS — N939 Abnormal uterine and vaginal bleeding, unspecified: Secondary | ICD-10-CM | POA: Insufficient documentation

## 2023-06-25 DIAGNOSIS — Z01812 Encounter for preprocedural laboratory examination: Secondary | ICD-10-CM | POA: Diagnosis present

## 2023-06-25 DIAGNOSIS — I10 Essential (primary) hypertension: Secondary | ICD-10-CM | POA: Insufficient documentation

## 2023-06-25 DIAGNOSIS — Z01818 Encounter for other preprocedural examination: Secondary | ICD-10-CM

## 2023-06-25 HISTORY — DX: Unspecified osteoarthritis, unspecified site: M19.90

## 2023-06-25 HISTORY — DX: Benign neoplasm of connective and other soft tissue, unspecified: D21.9

## 2023-06-25 HISTORY — DX: Anxiety disorder, unspecified: F41.9

## 2023-06-25 LAB — BASIC METABOLIC PANEL
Anion gap: 9 (ref 5–15)
BUN: 9 mg/dL (ref 6–20)
CO2: 23 mmol/L (ref 22–32)
Calcium: 9.2 mg/dL (ref 8.9–10.3)
Chloride: 106 mmol/L (ref 98–111)
Creatinine, Ser: 0.85 mg/dL (ref 0.44–1.00)
GFR, Estimated: >60 mL/min (ref >60)
Glucose, Bld: 98 mg/dL (ref 70–99)
Potassium: 3.6 mmol/L (ref 3.5–5.1)
Sodium: 138 mmol/L (ref 135–145)

## 2023-06-25 LAB — CBC
HCT: 32.3 % — ABNORMAL LOW (ref 36.0–46.0)
Hemoglobin: 8.9 g/dL — ABNORMAL LOW (ref 12.0–15.0)
MCH: 21.8 pg — ABNORMAL LOW (ref 26.0–34.0)
MCHC: 27.6 g/dL — ABNORMAL LOW (ref 30.0–36.0)
MCV: 79.2 fL — ABNORMAL LOW (ref 80.0–100.0)
Platelets: 537 10*3/uL — ABNORMAL HIGH (ref 150–400)
RBC: 4.08 MIL/uL (ref 3.87–5.11)
RDW: 24.4 % — ABNORMAL HIGH (ref 11.5–15.5)
WBC: 6.1 10*3/uL (ref 4.0–10.5)
nRBC: 0 % (ref 0.0–0.2)

## 2023-06-25 NOTE — Progress Notes (Addendum)
PCP - Dr. Lorriane Shire Cardiologist - Dr. Alverda Skeans  PPM/ICD - denies   Chest x-ray - 03/02/22- [facility] EKG - 05/16/23 Stress Test - 01/20/18 ECHO - 06/23/23 Cardiac Cath - denies  Sleep Study - denies   DM- denies  Last dose of GLP1 agonist-  n/a   ASA/Blood Thinner Instructions: n/a   ERAS Protcol - yes, no drink   COVID TEST- n/a   Anesthesia review: yes, cardiac hx. Abnormal CBC  Patient denies shortness of breath, fever, cough and chest pain at PAT appointment   All instructions explained to the patient, with a verbal understanding of the material. Patient agrees to go over the instructions while at home for a better understanding.  The opportunity to ask questions was provided.

## 2023-06-26 NOTE — Anesthesia Preprocedure Evaluation (Addendum)
Anesthesia Evaluation  Patient identified by MRN, date of birth, ID band Patient awake    Reviewed: Allergy & Precautions, NPO status , Patient's Chart, lab work & pertinent test results, reviewed documented beta blocker date and time   History of Anesthesia Complications Negative for: history of anesthetic complications  Airway Mallampati: II  TM Distance: >3 FB Neck ROM: Full    Dental  (+) Dental Advisory Given, Partial Upper   Pulmonary neg pulmonary ROS   Pulmonary exam normal        Cardiovascular hypertension, Pt. on medications and Pt. on home beta blockers Normal cardiovascular exam+ dysrhythmias Supra Ventricular Tachycardia    '25 TTE - EF 65 to 70%. Mild mitral valve regurgitation.     Neuro/Psych  PSYCHIATRIC DISORDERS Anxiety     negative neurological ROS     GI/Hepatic Neg liver ROS, PUD,,, UC    Endo/Other   Pre-DM Obesity   Renal/GU negative Renal ROS     Musculoskeletal  (+) Arthritis ,    Abdominal   Peds  Hematology  (+) Blood dyscrasia, anemia   Anesthesia Other Findings   Reproductive/Obstetrics  AUB                              Anesthesia Physical Anesthesia Plan  ASA: 2  Anesthesia Plan: General   Post-op Pain Management: Tylenol PO (pre-op)* and Toradol IV (intra-op)*   Induction: Intravenous  PONV Risk Score and Plan: 3 and Treatment may vary due to age or medical condition, Ondansetron, Scopolamine patch - Pre-op, Midazolam and Dexamethasone  Airway Management Planned: Oral ETT  Additional Equipment: None  Intra-op Plan:   Post-operative Plan: Extubation in OR  Informed Consent: I have reviewed the patients History and Physical, chart, labs and discussed the procedure including the risks, benefits and alternatives for the proposed anesthesia with the patient or authorized representative who has indicated his/her understanding and  acceptance.     Dental advisory given  Plan Discussed with: CRNA and Anesthesiologist  Anesthesia Plan Comments: (PAT note written )       Anesthesia Quick Evaluation

## 2023-06-26 NOTE — Progress Notes (Signed)
Anesthesia Chart Review:  Case: [id number] Date/Time: 07/02/23 1031   Procedures:      XI ROBOTIC ASSISTED LAPAROSCOPIC HYSTERECTOMY AND SALPINGECTOMY (Bilateral)     CYSTOSCOPY   Anesthesia type: Choice   Pre-op diagnosis: Abnormal uterine bleeding   Location: [facility] OR ROOM 10 / [facility] OR   Surgeons: Lorriane Shire, MD       DISCUSSION: Patient is a 53 year old female scheduled for the above procedure.  She has history of uterine fibroids with abnormal uterine bleeding.  Required 1 unit PRBC for HGB 6.9 on 01/15/24. Last ED visit on 05/12/2023 and started on norethindrone with outpatient GYN follow-up.  History includes never smoker, HTN, HLD, SVT, fibroids, menorrhagia, anemia, ulcerative colitis, prediabetes, anxiety, osteoarthritis.  She had preoperative evaluation by cardiologist Dr. Lynnette Caffey on 05/16/23. On Toprol for SVT history. EKG shhwed NSR, inferior infarct (age undetermined), possible anterior lateral infarct (age undetermined). He wrote, "Patient can do 4 METS of activity without any issues.  She is at low risk for perioperative cardiovascular complication during a low risk procedure (lapropscopic hysterectomy) from a cardiovascular standpoint.  Because her EKG demonstrates an anteroseptal and inferior infarction pattern will obtain an echocardiogram.  Only if the echocardiogram is severely abnormal would any further evaluation be required." 06/23/23 echo showed LVEF 65 to 70%, no regional wall motion abnormalities, normal diastolic parameters, normal RV systolic function, mild MR.  He subsequently cleared her at "low risk for cardiovascular issues."  06/25/23 H/H 8.9/32.3, up fro m7.8/28.2. T&S done with labs. (She needs a repeat type and screen on the day of surgery due to antibodies.) She is also s/p IV iron 06/10/23.  She is also on oral iron.  Anesthesia team to evaluate on the day of surgery.  VS: BP (!) 155/91   Pulse 87   Temp 37.1 C (Oral)   Resp 20   Ht 5\' 3"  (1.6 m)   Wt  78.9 kg   LMP 05/12/2023 (Approximate)   SpO2 100%   BMI 30.82 kg/m    PROVIDERS: Lorriane Shire, MD is PCP  Alverda Skeans, MD is cardiologist   LABS: Preoperative labs noted. See DISCUSSION. (all labs ordered are listed, but only abnormal results are displayed)  Labs Reviewed  CBC - Abnormal; Notable for the following components:      Result Value   Hemoglobin 8.9 (*)    HCT 32.3 (*)    MCV 79.2 (*)    MCH 21.8 (*)    MCHC 27.6 (*)    RDW 24.4 (*)    Platelets 537 (*)    All other components within normal limits  BASIC METABOLIC PANEL  TYPE AND SCREEN    EKG: 05/16/23: Normal sinus rhythm Inferior infarct , age undetermined Possible Anterolateral infarct , age undetermined When compared with ECG of 21-Nov-2021 07:42, PREVIOUS ECG IS PRESENT Confirmed by Alverda Skeans ([id number]) on 05/16/2023 4:41:21 PM  CV: Echo 06/23/23: IMPRESSIONS   1. Left ventricular ejection fraction, by estimation, is 65 to 70%. The  left ventricle has normal function. The left ventricle has no regional  wall motion abnormalities. Left ventricular diastolic parameters were  normal. The average left ventricular  global longitudinal strain is -22.0 %. The global longitudinal strain is  normal.   2. Right ventricular systolic function is normal. The right ventricular  size is normal.   3. The mitral valve is normal in structure. Mild mitral valve  regurgitation.   4. The aortic valve is tricuspid. Aortic valve regurgitation is not  visualized.  5. The inferior vena cava is normal in size with greater than 50%  respiratory variability, suggesting right atrial pressure of 3 mmHg.    ETT 01/20/18: Blood pressure demonstrated a hypertensive response to exercise. There was no ST segment deviation noted during stress. No ST changes of ischemia. Normal exercise tolerance achieving 10.1 mets at an adequate HR response of 97% MPHR. This is a low risk study.    Past Medical History:   Diagnosis Date   Anemia    Anxiety    Arthritis    right knee   Fibroids    Hyperlipidemia    Hypertension    Menorrhagia    Prediabetes    SVT (supraventricular tachycardia) (HCC)    Ulcerative colitis (HCC)    Vitamin D deficiency     Past Surgical History:  Procedure Laterality Date   CESAREAN SECTION     TUBAL LIGATION      MEDICATIONS:  amLODipine (NORVASC) 5 MG tablet   Blood Pressure Monitor KIT   iron polysaccharides (NIFEREX) 150 MG capsule   metoprolol succinate (TOPROL-XL) 50 MG 24 hr tablet   norethindrone (AYGESTIN) 5 MG tablet   No current facility-administered medications for this encounter.    acetaminophen (TYLENOL) tablet 650 mg   diphenhydrAMINE (BENADRYL) capsule 25 mg   iron sucrose (VENOFER) injection 200 mg    Shonna Chock, PA-C Surgical Short Stay/Anesthesiology [facility] Phone [phone] [facility] Phone [phone] 06/26/2023 3:41 PM

## 2023-06-26 NOTE — Progress Notes (Signed)
Blood bank called that patient is postivie for antibodies and will need a new type and screen drawn on the day of surgery.  OK to use the same blood band.

## 2023-07-01 ENCOUNTER — Ambulatory Visit: Payer: BC Managed Care – PPO | Admitting: *Deleted

## 2023-07-01 VITALS — BP 153/87 | HR 73 | Temp 98.6°F | Resp 16 | Ht 63.0 in | Wt 171.4 lb

## 2023-07-01 DIAGNOSIS — D500 Iron deficiency anemia secondary to blood loss (chronic): Secondary | ICD-10-CM

## 2023-07-01 DIAGNOSIS — N939 Abnormal uterine and vaginal bleeding, unspecified: Secondary | ICD-10-CM | POA: Diagnosis not present

## 2023-07-01 MED ORDER — DIPHENHYDRAMINE HCL 25 MG PO CAPS
25.0000 mg | ORAL_CAPSULE | Freq: Once | ORAL | Status: AC
  Administered 2023-07-01: 25 mg via ORAL
  Filled 2023-07-01: qty 1

## 2023-07-01 MED ORDER — ACETAMINOPHEN 325 MG PO TABS
650.0000 mg | ORAL_TABLET | Freq: Once | ORAL | Status: AC
  Administered 2023-07-01: 650 mg via ORAL
  Filled 2023-07-01: qty 2

## 2023-07-01 MED ORDER — IRON SUCROSE 20 MG/ML IV SOLN
200.0000 mg | Freq: Once | INTRAVENOUS | Status: AC
  Administered 2023-07-01: 200 mg via INTRAVENOUS
  Filled 2023-07-01: qty 10

## 2023-07-01 NOTE — Progress Notes (Signed)
Diagnosis: Iron Deficiency Anemia  Provider:  Chilton Greathouse MD  Procedure: IV Push  IV Type: Peripheral, IV Location: R Antecubital  Venofer (Iron Sucrose), Dose: 200 mg  Post Infusion IV Care: Observation period completed and Peripheral IV Discontinued  Discharge: Condition: Good, Destination: Home . AVS Provided  Performed by:  Forrest Moron, RN

## 2023-07-02 ENCOUNTER — Other Ambulatory Visit: Payer: Self-pay

## 2023-07-02 ENCOUNTER — Encounter: Payer: Self-pay | Admitting: Obstetrics and Gynecology

## 2023-07-02 ENCOUNTER — Encounter (HOSPITAL_COMMUNITY)
Admission: RE | Disposition: A | Discharge status: Home / Self Care | Payer: Self-pay | Source: Home / Self Care | Attending: Obstetrics and Gynecology

## 2023-07-02 ENCOUNTER — Ambulatory Visit (HOSPITAL_COMMUNITY): Payer: BC Managed Care – PPO

## 2023-07-02 ENCOUNTER — Encounter: Payer: Self-pay | Admitting: Hematology and Oncology

## 2023-07-02 ENCOUNTER — Encounter (HOSPITAL_COMMUNITY): Payer: Self-pay | Admitting: Obstetrics and Gynecology

## 2023-07-02 ENCOUNTER — Ambulatory Visit (HOSPITAL_COMMUNITY)
Admission: RE | Admit: 2023-07-02 | Discharge: 2023-07-02 | Disposition: A | Discharge status: Home / Self Care | Payer: BC Managed Care – PPO | Attending: Obstetrics and Gynecology | Admitting: Obstetrics and Gynecology

## 2023-07-02 ENCOUNTER — Other Ambulatory Visit (HOSPITAL_COMMUNITY): Payer: Self-pay

## 2023-07-02 ENCOUNTER — Ambulatory Visit (HOSPITAL_COMMUNITY): Payer: BC Managed Care – PPO | Admitting: Vascular Surgery

## 2023-07-02 DIAGNOSIS — E669 Obesity, unspecified: Secondary | ICD-10-CM | POA: Insufficient documentation

## 2023-07-02 DIAGNOSIS — N8003 Adenomyosis of the uterus: Secondary | ICD-10-CM | POA: Diagnosis not present

## 2023-07-02 DIAGNOSIS — N938 Other specified abnormal uterine and vaginal bleeding: Secondary | ICD-10-CM | POA: Diagnosis not present

## 2023-07-02 DIAGNOSIS — I10 Essential (primary) hypertension: Secondary | ICD-10-CM | POA: Diagnosis not present

## 2023-07-02 DIAGNOSIS — D250 Submucous leiomyoma of uterus: Secondary | ICD-10-CM | POA: Diagnosis not present

## 2023-07-02 DIAGNOSIS — D252 Subserosal leiomyoma of uterus: Secondary | ICD-10-CM | POA: Insufficient documentation

## 2023-07-02 DIAGNOSIS — Z6830 Body mass index (BMI) 30.0-30.9, adult: Secondary | ICD-10-CM | POA: Insufficient documentation

## 2023-07-02 DIAGNOSIS — D251 Intramural leiomyoma of uterus: Secondary | ICD-10-CM | POA: Diagnosis not present

## 2023-07-02 DIAGNOSIS — N939 Abnormal uterine and vaginal bleeding, unspecified: Secondary | ICD-10-CM | POA: Diagnosis present

## 2023-07-02 HISTORY — PX: ROBOTIC ASSISTED LAPAROSCOPIC HYSTERECTOMY AND SALPINGECTOMY: SHX6379

## 2023-07-02 HISTORY — PX: CYSTOSCOPY: SHX5120

## 2023-07-02 LAB — TYPE AND SCREEN
ABO/RH(D): B POS
Antibody Screen: POSITIVE

## 2023-07-02 LAB — POCT PREGNANCY, URINE: Preg Test, Ur: NEGATIVE

## 2023-07-02 SURGERY — XI ROBOTIC ASSISTED LAPAROSCOPIC HYSTERECTOMY AND SALPINGECTOMY
Anesthesia: General | Site: Bladder

## 2023-07-02 MED ORDER — LIDOCAINE 2% (20 MG/ML) 5 ML SYRINGE
INTRAMUSCULAR | Status: DC | PRN
  Administered 2023-07-02: 60 mg via INTRAVENOUS

## 2023-07-02 MED ORDER — MIDAZOLAM HCL 2 MG/2ML IJ SOLN
INTRAMUSCULAR | Status: DC | PRN
  Administered 2023-07-02: 2 mg via INTRAVENOUS

## 2023-07-02 MED ORDER — BUPIVACAINE HCL (PF) 0.5 % IJ SOLN
INTRAMUSCULAR | Status: DC | PRN
  Administered 2023-07-02: 7 mL

## 2023-07-02 MED ORDER — OXYCODONE HCL 5 MG PO TABS: 5.0000 mg | ORAL_TABLET | Freq: Once | ORAL | Status: DC | PRN

## 2023-07-02 MED ORDER — FENTANYL CITRATE (PF) 250 MCG/5ML IJ SOLN
INTRAMUSCULAR | Status: DC | PRN
  Administered 2023-07-02: 50 ug via INTRAVENOUS
  Administered 2023-07-02: 100 ug via INTRAVENOUS

## 2023-07-02 MED ORDER — FENTANYL CITRATE (PF) 250 MCG/5ML IJ SOLN
INTRAMUSCULAR | Status: AC
  Filled 2023-07-02: qty 5

## 2023-07-02 MED ORDER — DEXAMETHASONE SODIUM PHOSPHATE 10 MG/ML IJ SOLN
INTRAMUSCULAR | Status: AC
  Filled 2023-07-02: qty 1

## 2023-07-02 MED ORDER — FENTANYL CITRATE (PF) 100 MCG/2ML IJ SOLN: 25.0000 ug | INTRAMUSCULAR | Status: DC | PRN

## 2023-07-02 MED ORDER — 0.9 % SODIUM CHLORIDE (POUR BTL) OPTIME
TOPICAL | Status: DC | PRN
  Administered 2023-07-02: 500 mL

## 2023-07-02 MED ORDER — CEFAZOLIN SODIUM-DEXTROSE 2-4 GM/100ML-% IV SOLN
2.0000 g | INTRAVENOUS | Status: AC
  Administered 2023-07-02: 2 g via INTRAVENOUS
  Filled 2023-07-02: qty 100

## 2023-07-02 MED ORDER — OXYCODONE HCL 5 MG/5ML PO SOLN: 5.0000 mg | Freq: Once | ORAL | Status: DC | PRN

## 2023-07-02 MED ORDER — HYDROMORPHONE HCL 1 MG/ML IJ SOLN
INTRAMUSCULAR | Status: AC
  Filled 2023-07-02: qty 0.5

## 2023-07-02 MED ORDER — FLUORESCEIN SODIUM 10 % IV SOLN
INTRAVENOUS | Status: DC | PRN
  Administered 2023-07-02: .25 mL via INTRAVENOUS

## 2023-07-02 MED ORDER — EPHEDRINE 5 MG/ML INJ
INTRAVENOUS | Status: AC
  Filled 2023-07-02: qty 5

## 2023-07-02 MED ORDER — ONDANSETRON HCL 4 MG/2ML IJ SOLN
INTRAMUSCULAR | Status: DC | PRN
  Administered 2023-07-02: 4 mg via INTRAVENOUS

## 2023-07-02 MED ORDER — PROPOFOL 10 MG/ML IV BOLUS
INTRAVENOUS | Status: AC
  Filled 2023-07-02: qty 20

## 2023-07-02 MED ORDER — SODIUM CHLORIDE 0.9 % IV SOLN: INTRAVENOUS | Status: DC | PRN

## 2023-07-02 MED ORDER — EPHEDRINE SULFATE-NACL 50-0.9 MG/10ML-% IV SOSY
PREFILLED_SYRINGE | INTRAVENOUS | Status: DC | PRN
  Administered 2023-07-02: 5 mg via INTRAVENOUS

## 2023-07-02 MED ORDER — BUPIVACAINE HCL (PF) 0.5 % IJ SOLN
INTRAMUSCULAR | Status: AC
  Filled 2023-07-02: qty 30

## 2023-07-02 MED ORDER — ROCURONIUM BROMIDE 10 MG/ML (PF) SYRINGE
PREFILLED_SYRINGE | INTRAVENOUS | Status: DC | PRN
  Administered 2023-07-02: 10 mg via INTRAVENOUS
  Administered 2023-07-02: 20 mg via INTRAVENOUS
  Administered 2023-07-02: 70 mg via INTRAVENOUS

## 2023-07-02 MED ORDER — ACETAMINOPHEN 500 MG PO TABS
1000.0000 mg | ORAL_TABLET | ORAL | Status: AC
  Administered 2023-07-02: 1000 mg via ORAL
  Filled 2023-07-02: qty 2

## 2023-07-02 MED ORDER — LACTATED RINGERS IV SOLN: INTRAVENOUS | Status: DC

## 2023-07-02 MED ORDER — AMISULPRIDE (ANTIEMETIC) 5 MG/2ML IV SOLN: 10.0000 mg | Freq: Once | INTRAVENOUS | Status: DC | PRN

## 2023-07-02 MED ORDER — CHLORHEXIDINE GLUCONATE 0.12 % MT SOLN
15.0000 mL | Freq: Once | OROMUCOSAL | Status: AC
Start: 2023-07-02 — End: 2023-07-02
  Administered 2023-07-02: 15 mL via OROMUCOSAL
  Filled 2023-07-02: qty 15

## 2023-07-02 MED ORDER — POLYETHYLENE GLYCOL 3350 17 GM/SCOOP PO POWD
17.0000 g | Freq: Every day | ORAL | 1 refills | Status: AC
  Filled 2023-07-02: qty 476, 28d supply, fill #0

## 2023-07-02 MED ORDER — IBUPROFEN 800 MG PO TABS
800.0000 mg | ORAL_TABLET | Freq: Three times a day (TID) | ORAL | 1 refills | Status: AC | PRN
  Filled 2023-07-02: qty 90, 30d supply, fill #0

## 2023-07-02 MED ORDER — DEXAMETHASONE SODIUM PHOSPHATE 10 MG/ML IJ SOLN
INTRAMUSCULAR | Status: DC | PRN
  Administered 2023-07-02: 10 mg via INTRAVENOUS

## 2023-07-02 MED ORDER — ORAL CARE MOUTH RINSE: 15.0000 mL | Freq: Once | OROMUCOSAL | Status: AC

## 2023-07-02 MED ORDER — SODIUM CHLORIDE 0.9 % IR SOLN
Status: DC | PRN
  Administered 2023-07-02: 500 mL

## 2023-07-02 MED ORDER — HYDROMORPHONE HCL 1 MG/ML IJ SOLN
INTRAMUSCULAR | Status: DC | PRN
  Administered 2023-07-02: .5 mg via INTRAVENOUS

## 2023-07-02 MED ORDER — OXYCODONE HCL 5 MG PO TABS
5.0000 mg | ORAL_TABLET | ORAL | 0 refills | Status: AC | PRN
  Filled 2023-07-02: qty 20, 4d supply, fill #0

## 2023-07-02 MED ORDER — LIDOCAINE 2% (20 MG/ML) 5 ML SYRINGE
INTRAMUSCULAR | Status: AC
  Filled 2023-07-02: qty 5

## 2023-07-02 MED ORDER — SODIUM CHLORIDE 0.9 % IR SOLN
Status: DC | PRN
  Administered 2023-07-02: 1000 mL via INTRAVESICAL

## 2023-07-02 MED ORDER — ROCURONIUM BROMIDE 10 MG/ML (PF) SYRINGE
PREFILLED_SYRINGE | INTRAVENOUS | Status: AC
  Filled 2023-07-02: qty 10

## 2023-07-02 MED ORDER — SODIUM CHLORIDE 0.9 % IV SOLN: 12.5000 mg | INTRAVENOUS | Status: DC | PRN

## 2023-07-02 MED ORDER — SUGAMMADEX SODIUM 200 MG/2ML IV SOLN
INTRAVENOUS | Status: DC | PRN
  Administered 2023-07-02: 200 mg via INTRAVENOUS

## 2023-07-02 MED ORDER — ACETAMINOPHEN 500 MG PO TABS: 1000.0000 mg | ORAL_TABLET | Freq: Once | ORAL | Status: DC

## 2023-07-02 MED ORDER — ONDANSETRON HCL 4 MG/2ML IJ SOLN
INTRAMUSCULAR | Status: AC
  Filled 2023-07-02: qty 2

## 2023-07-02 MED ORDER — SCOPOLAMINE 1 MG/3DAYS TD PT72
1.0000 | MEDICATED_PATCH | TRANSDERMAL | Status: DC
Start: 2023-07-02 — End: 2023-07-02
  Administered 2023-07-02: 1.5 mg via TRANSDERMAL
  Filled 2023-07-02: qty 1

## 2023-07-02 MED ORDER — PROPOFOL 10 MG/ML IV BOLUS
INTRAVENOUS | Status: DC | PRN
  Administered 2023-07-02: 160 mg via INTRAVENOUS

## 2023-07-02 MED ORDER — KETOROLAC TROMETHAMINE 30 MG/ML IJ SOLN
INTRAMUSCULAR | Status: AC
  Filled 2023-07-02: qty 1

## 2023-07-02 MED ORDER — ACETAMINOPHEN 500 MG PO TABS
1000.0000 mg | ORAL_TABLET | Freq: Four times a day (QID) | ORAL | 1 refills | Status: AC | PRN
  Filled 2023-07-02: qty 120, 15d supply, fill #0

## 2023-07-02 MED ORDER — POVIDONE-IODINE 10 % EX SWAB
2.0000 | Freq: Once | CUTANEOUS | Status: AC
  Administered 2023-07-02: 2 via TOPICAL

## 2023-07-02 MED ORDER — METRONIDAZOLE 500 MG/100ML IV SOLN
500.0000 mg | INTRAVENOUS | Status: AC
  Administered 2023-07-02: 500 mg via INTRAVENOUS
  Filled 2023-07-02: qty 100

## 2023-07-02 MED ORDER — MIDAZOLAM HCL 2 MG/2ML IJ SOLN
INTRAMUSCULAR | Status: AC
  Filled 2023-07-02: qty 2

## 2023-07-02 SURGICAL SUPPLY — 64 items
APPLICATOR ARISTA FLEXITIP XL (MISCELLANEOUS) IMPLANT
BARRIER ADHS 3X4 INTERCEED (GAUZE/BANDAGES/DRESSINGS) IMPLANT
BLADE SURG 11 STRL SS (BLADE) IMPLANT
CHLORAPREP W/TINT 26 (MISCELLANEOUS) ×2 IMPLANT
COVER BACK TABLE 60X90IN (DRAPES) ×2 IMPLANT
COVER TIP SHEARS 8 DVNC (MISCELLANEOUS) ×2 IMPLANT
DEFOGGER SCOPE WARMER CLEARIFY (MISCELLANEOUS) ×2 IMPLANT
DERMABOND ADVANCED .7 DNX12 (GAUZE/BANDAGES/DRESSINGS) ×2 IMPLANT
DEVICE RETRIEVAL ALEXIS 14 (MISCELLANEOUS) IMPLANT
DRAPE ARM DVNC X/XI (DISPOSABLE) ×8 IMPLANT
DRAPE COLUMN DVNC XI (DISPOSABLE) ×2 IMPLANT
DRAPE UTILITY XL STRL (DRAPES) ×2 IMPLANT
DRIVER NDL MEGA SUTCUT DVNCXI (INSTRUMENTS) ×2 IMPLANT
DRIVER NDLE MEGA SUTCUT DVNCXI (INSTRUMENTS) ×2 IMPLANT
ELECT REM PT RETURN 9FT ADLT (ELECTROSURGICAL) ×2 IMPLANT
ELECTRODE REM PT RTRN 9FT ADLT (ELECTROSURGICAL) ×2 IMPLANT
EXTRT SYSTEM ALEXIS 14CM (MISCELLANEOUS) ×2 IMPLANT
FORCEPS BPLR FENES DVNC XI (FORCEP) ×2 IMPLANT
FORCEPS PROGRASP DVNC XI (FORCEP) ×2 IMPLANT
GAUZE 4X4 16PLY ~~LOC~~+RFID DBL (SPONGE) ×4 IMPLANT
GLOVE BIOGEL PI IND STRL 7.0 (GLOVE) ×6 IMPLANT
GLOVE ECLIPSE 7.0 STRL STRAW (GLOVE) ×6 IMPLANT
GOWN STRL REUS W/TWL XL LVL3 (GOWN DISPOSABLE) ×8 IMPLANT
GYRUS RUMI II 3.5CM BLUE (DISPOSABLE) ×2 IMPLANT
HEMOSTAT ARISTA ABSORB 3G PWDR (HEMOSTASIS) IMPLANT
HIBICLENS CHG 4% 4OZ BTL (MISCELLANEOUS) ×4 IMPLANT
HOLDER FOLEY CATH W/STRAP (MISCELLANEOUS) ×2 IMPLANT
IRRIG SUCT STRYKERFLOW 2 WTIP (MISCELLANEOUS) ×2 IMPLANT
IRRIGATION SUCT STRKRFLW 2 WTP (MISCELLANEOUS) ×2 IMPLANT
KIT PINK PAD W/HEAD ARE REST (MISCELLANEOUS) ×2 IMPLANT
KIT PINK PAD W/HEAD ARM REST (MISCELLANEOUS) ×2 IMPLANT
KIT TURNOVER KIT A (KITS) ×2 IMPLANT
LEGGING LITHOTOMY PAIR STRL (DRAPES) ×2 IMPLANT
MANIFOLD NEPTUNE II (INSTRUMENTS) IMPLANT
NDL INSUFFLATION 14GA 120MM (NEEDLE) IMPLANT
NEEDLE INSUFFLATION 14GA 120MM (NEEDLE) IMPLANT
NS IRRIG 1000ML POUR BTL (IV SOLUTION) ×2 IMPLANT
OBTURATOR OPTICAL STND 8 DVNC (TROCAR) ×2 IMPLANT
OBTURATOR OPTICALSTD 8 DVNC (TROCAR) ×2 IMPLANT
OCCLUDER COLPOPNEUMO (BALLOONS) ×2 IMPLANT
PACK ROBOT WH (CUSTOM PROCEDURE TRAY) ×2 IMPLANT
PAD OB MATERNITY 4.3X12.25 (PERSONAL CARE ITEMS) ×2 IMPLANT
PAD POSITIONING PINK XL (MISCELLANEOUS) ×2 IMPLANT
RUMI II GYRUS 3.5CM BLUE (DISPOSABLE) IMPLANT
SCISSORS MNPLR CVD DVNC XI (INSTRUMENTS) ×2 IMPLANT
SEAL UNIV 5-12 XI (MISCELLANEOUS) ×8 IMPLANT
SEALER VESSEL EXT DVNC XI (MISCELLANEOUS) ×2 IMPLANT
SET CYSTO W/LG BORE CLAMP LF (SET/KITS/TRAYS/PACK) ×2 IMPLANT
SET TRI-LUMEN FLTR TB AIRSEAL (TUBING) ×2 IMPLANT
SPIKE FLUID TRANSFER (MISCELLANEOUS) ×2 IMPLANT
SUT VIC AB 0 CT1 27XBRD ANBCTR (SUTURE) ×2 IMPLANT
SUT VIC AB 4-0 PS2 18 (SUTURE) ×4 IMPLANT
SUT VLOC 180 0 9IN GS21 (SUTURE) ×2 IMPLANT
SYS BAG RETRIEVAL 10MM (BASKET) IMPLANT
SYSTEM BAG RETRIEVAL 10MM (BASKET) IMPLANT
TIP RUMI ORANGE 6.7MMX12CM (TIP) IMPLANT
TIP UTERINE 5.1X6CM LAV DISP (MISCELLANEOUS) IMPLANT
TIP UTERINE 6.7X10CM GRN DISP (MISCELLANEOUS) IMPLANT
TIP UTERINE 6.7X6CM WHT DISP (MISCELLANEOUS) IMPLANT
TIP UTERINE 6.7X8CM BLUE DISP (MISCELLANEOUS) IMPLANT
TOWEL GREEN STERILE (TOWEL DISPOSABLE) ×2 IMPLANT
TRAY FOLEY W/BAG SLVR 14FR (SET/KITS/TRAYS/PACK) ×2 IMPLANT
TROCAR PORT AIRSEAL 8X120 (TROCAR) ×2 IMPLANT
UNDERPAD 30X36 HEAVY ABSORB (UNDERPADS AND DIAPERS) ×2 IMPLANT

## 2023-07-02 NOTE — Brief Op Note (Signed)
07/02/2023  4:18 PM  PATIENT:  Haley Gonzales  53 y.o. female  PRE-OPERATIVE DIAGNOSIS:  Abnormal uterine bleeding  POST-OPERATIVE DIAGNOSIS:  Abnormal uterine bleeding  PROCEDURE:  Procedure(s): XI ROBOTIC ASSISTED LAPAROSCOPIC HYSTERECTOMY AND SALPINGECTOMY (Bilateral) CYSTOSCOPY (N/A)  SURGEON:  Surgeons and Role:    Lorriane Shire, MD - Primary    * Jerene Bears, MD - Assisting  PHYSICIAN ASSISTANT: Clement Sayres, PA  ASSISTANTS: as above   ANESTHESIA:   local and general  EBL:  50 mL   BLOOD ADMINISTERED:none  DRAINS: none   LOCAL MEDICATIONS USED:  BUPIVICAINE   SPECIMEN:  Source of Specimen:  uterus, cervix, bilateral fallopian tubes  DISPOSITION OF SPECIMEN:  PATHOLOGY  COUNTS:  YES  TOURNIQUET:  * No tourniquets in log *  DICTATION: .Note written in EPIC  PLAN OF CARE:  extended recovery  PATIENT DISPOSITION:  PACU - hemodynamically stable.   Delay start of Pharmacological VTE agent (>24hrs) due to surgical blood loss or risk of bleeding: not applicable

## 2023-07-02 NOTE — Transfer of Care (Signed)
Immediate Anesthesia Transfer of Care Note  Patient: Haley Gonzales  Procedure(s) Performed: XI ROBOTIC ASSISTED LAPAROSCOPIC HYSTERECTOMY AND SALPINGECTOMY (Bilateral: Abdomen) CYSTOSCOPY (Bladder)  Patient Location: PACU  Anesthesia Type:General  Level of Consciousness: awake, drowsy, and patient cooperative  Airway & Oxygen Therapy: Patient Spontanous Breathing and Patient connected to face mask oxygen  Post-op Assessment: Report given to RN, Post -op Vital signs reviewed and stable, and Patient moving all extremities X 4  Post vital signs: Reviewed and stable  Last Vitals:  Vitals Value Taken Time  BP 139/78 07/02/23 1631  Temp    Pulse 73 07/02/23 1636  Resp 14 07/02/23 1636  SpO2 96 % 07/02/23 1636  Vitals shown include unfiled device data.  Last Pain:  Vitals:   07/02/23 0936  PainSc: 0-No pain         Complications: No notable events documented.

## 2023-07-02 NOTE — Anesthesia Postprocedure Evaluation (Signed)
Anesthesia Post Note  Patient: Haley Gonzales  Procedure(s) Performed: XI ROBOTIC ASSISTED LAPAROSCOPIC HYSTERECTOMY AND SALPINGECTOMY (Bilateral: Abdomen) CYSTOSCOPY (Bladder)     Patient location during evaluation: PACU Anesthesia Type: General Level of consciousness: awake and alert Pain management: pain level controlled Vital Signs Assessment: post-procedure vital signs reviewed and stable Respiratory status: spontaneous breathing, nonlabored ventilation and respiratory function stable Cardiovascular status: stable and blood pressure returned to baseline Anesthetic complications: no  No notable events documented.  Last Vitals:  Vitals:   07/02/23 1715 07/02/23 1730  BP: (!) 155/80 (!) 152/87  Pulse: (!) 53 60  Resp: 11 14  Temp:  36.7 C  SpO2: 92% 94%    Last Pain:  Vitals:   07/02/23 1730  PainSc: 0-No pain                 Beryle Lathe

## 2023-07-02 NOTE — H&P (Signed)
OB/GYN Pre-Op History and Physical  Haley Gonzales is a 53 y.o. [phone] presenting for definitive surgical management of AUB. Cleared by cardiology for surgery Benign EMB  Not able to toelrate 500mg  IV transfusion but able to tolerate 200mg       Past Medical History:  Diagnosis Date   Anemia    Anxiety    Arthritis    right knee   Fibroids    Hyperlipidemia    Hypertension    Menorrhagia    Prediabetes    SVT (supraventricular tachycardia) (HCC)    Ulcerative colitis (HCC)    Vitamin D deficiency     Past Surgical History:  Procedure Laterality Date   CESAREAN SECTION     TUBAL LIGATION      OB History  Gravida Para Term Preterm AB Living  4 4 2 2  3   SAB IAB Ectopic Multiple Live Births      4    # Outcome Date GA Lbr Len/2nd Weight Sex Type Anes PTL Lv  4 Preterm           3 Preterm           2 Term           1 Term             Social History   Socioeconomic History   Marital status: Legally Separated    Spouse name: Not on file   Number of children: 3   Years of education: Not on file   Highest education level: Not on file  Occupational History   Not on file  Tobacco Use   Smoking status: Never   Smokeless tobacco: Never  Vaping Use   Vaping status: Never Used  Substance and Sexual Activity   Alcohol use: Not Currently   Drug use: No   Sexual activity: Not Currently    Partners: Male    Birth control/protection: Abstinence, Pill  Other Topics Concern   Not on file  Social History Narrative   Not on file   Social Drivers of Health   Financial Resource Strain: Not on file  Food Insecurity: Not on file  Transportation Needs: Not on file  Physical Activity: Not on file  Stress: Not on file  Social Connections: Not on file    Family History  Problem Relation Age of Onset   Hypertension Mother    Lung cancer Father     Medications Prior to Admission  Medication Sig Dispense Refill Last Dose/Taking   amLODipine (NORVASC) 5 MG  tablet TAKE 1 TABLET BY MOUTH ONCE DAILY . APPOINTMENT REQUIRED FOR FUTURE REFILLS 30 tablet 1 07/02/2023 at  8:00 AM   iron polysaccharides (NIFEREX) 150 MG capsule Take 1 capsule (150 mg total) by mouth daily. 30 capsule 0 Taking   metoprolol succinate (TOPROL-XL) 50 MG 24 hr tablet Take 1 tablet (50 mg total) by mouth daily. 30 tablet 1 07/02/2023 at  8:00 AM   norethindrone (AYGESTIN) 5 MG tablet Take 2 tablets (10 mg total) by mouth daily. 60 tablet 2 Past Week   Blood Pressure Monitor KIT Dispense based on patient and insurance preference. To check blood pressure as needed. 1 kit 0     No Known Allergies  Review of Systems: Negative except for what is mentioned in HPI.     Physical Exam: BP (!) 158/93 (BP Location: Right Arm)   Pulse 83   Temp 98.3 F (36.8 C)   Resp 18   Ht 5'  3" (1.6 m)   Wt 77.6 kg   LMP 05/12/2023 (Approximate)   SpO2 98%   BMI 30.29 kg/m  CONSTITUTIONAL: Well-developed, well-nourished and in no acute distress.  HENT:  Normocephalic, atraumatic, External right and left ear normal. Oropharynx is clear and moist EYES: Conjunctivae and EOM are normal. Pupils are equal, round, and reactive to light. No scleral icterus.  NECK: Normal range of motion, supple, no masses SKIN: Skin is warm and dry. No rash noted. Not diaphoretic. No erythema. No pallor. NEUROLGIC: Alert and oriented to person, place, and time. Normal reflexes, muscle tone coordination. No cranial nerve deficit noted. PSYCHIATRIC: Normal mood and affect. Normal behavior. Normal judgment and thought content. RESPIRATORY: Normal effort PELVIC: Deferred   Pertinent Labs/Studies:   Results for orders placed or performed during the hospital encounter of 07/02/23 (from the past 72 hours)  Type and screen Patient positive for antibodies at PAT     Status: None   Collection Time: 07/02/23  9:43 AM  Result Value Ref Range   ABO/RH(D) B POS    Antibody Screen POS    Sample Expiration       07/05/2023,2359 Performed at [facility], [address] [place], [place] [address]        Assessment and Plan :Haley Gonzales is a 53 y.o. [phone] here for definitive management of UAB.   Patient desires surgical management with RA-TLH, BS, cysto.  The risks of surgery were discussed in detail with the patient including but not limited to: bleeding which may require transfusion or reoperation; infection which may require prolonged hospitalization or re-hospitalization and antibiotic therapy; injury to bowel, bladder, ureters and major vessels or other surrounding organs which may lead to other procedures; formation of adhesions; need for additional procedures including laparotomy or subsequent procedures secondary to intraoperative injury or abnormal pathology; thromboembolic phenomenon; incisional problems and other postoperative or anesthesia complications.  Patient was told that the likelihood that her condition and symptoms will be treated effectively with this surgical management was high; the postoperative expectations were also discussed in detail. The patient also understands the alternative treatment options which were discussed in full. All questions were answered.    Haley Gonzales, M.D. Minimally Invasive Gynecologic Surgery and Pelvic Pain Specialist Attending Obstetrician & Gynecologist, [facility] [employer], [facility]

## 2023-07-02 NOTE — Discharge Instructions (Signed)
Post Op Hysterectomy Instructions Please read the instructions below. Refer to these instructions for the next few weeks. These instructions provide you with general information on caring for yourself after surgery. Your caregiver may also give you specific instructions. While your treatment has been planned according to the most current medical practices available, unavoidable problems sometimes happen. If you have any problems or questions after you leave, please call your caregiver.  HOME CARE INSTRUCTIONS Healing will take time. You will have discomfort, tenderness, swelling and bruising at the operative site for a couple of weeks. This is normal and will get better as time goes on.  Only take over-the-counter or prescription medicines for pain, discomfort or fever as directed by your caregiver.  Do not take aspirin. It can cause bleeding.  Do not drive when taking pain medication.  Follow your caregiver's advice regarding diet, exercise, lifting, driving and general activities.  Resume your usual diet as directed and allowed.  Get plenty of rest and sleep.  Do not douche, use tampons, or have sexual intercourse until your caregiver gives you permission. .  Take your temperature if you feel hot or flushed.  You may shower today when you get home.  No tub bath for one week.   Do not drink alcohol until you are not taking any narcotic pain medications.  Try to have someone home with you for a week or two to help with the household activities.   Be careful over the next two to three weeks with any activities at home that involve lifting, pushing, or pulling.  Listen to your body--if something feels uncomfortable to do, then don't do it. Make sure you and your family understands everything about your operation and recovery.  Walking up stairs is fine. Do not sign any legal documents until you feel normal again.  Keep all your follow-up appointments as recommended by your caregiver.   PLEASE CALL  THE OFFICE IF: There is swelling, redness or increasing pain in the wound area.  Pus is coming from the wound.  You notice a bad smell from the wound or surgical dressing.  You have pain, redness and swelling from the intravenous site.  The wound is breaking open (the edges are not staying together).   You develop pain or bleeding when you urinate.  You develop abnormal vaginal discharge.  You have any type of abnormal reaction or develop an allergy to your medication.  You need stronger pain medication for your pain   SEEK IMMEDIATE MEDICAL CARE: You develop a temperature of 100.5 or higher.  You develop abdominal pain.  You develop chest pain.  You develop shortness of breath.  You pass out.  You develop pain, swelling or redness of your leg.  You develop heavy vaginal bleeding with or without blood clots.   MEDICATIONS: Restart your regular medications BUT wait one week before restarting all vitamins and mineral supplements Use Motrin 800mg  every 8 hours for the next several days.   Take Tylenol 1000mg  every 8 hours for the next several days Use oxycodone 5 mg every 4-6 hours. Taking motrin and tylenol should help reduce how often you use oxycodone.  You may use an over the counter stool softener like Colace or Dulcolax to help with starting a bowel movement.  You can also use miralax (polyethylene glycol). Start the day after you go home.  Warm liquids, fluids, and ambulation help too.  If you have not had a bowel movement in four days, you need to  call the office.

## 2023-07-02 NOTE — Op Note (Signed)
Haley Gonzales PROCEDURE DATE: 07/02/2023  PREOPERATIVE DIAGNOSIS: abnormal uterine bleeding, fibroid  POSTOPERATIVE DIAGNOSIS: abnormal uterine bleeding, fibroid PROCEDURE:    robotic asissted total laparoscopic hysterectomy, bilateral salpingectomy, cystoscopy  SURGEON: Lorriane Shire, MD ASSISTANT:  Leda Quail, MD ; Clement Sayres, [place]  An experienced assistant was required given the standard of surgical care given the complexity of the case.  This assistant was needed for exposure, dissection, suctioning, retraction, instrument exchange, and for overall help during the procedure.  INDICATIONS: 53 y.o. [id number] with AUB-L.  Risks of surgery were discussed with the patient including but not limited to: bleeding which may require transfusion; infection which may require antibiotics; injury to surrounding organs; need for additional procedures including laparotomy;  and other postoperative/anesthesia complications. Written informed consent was obtained.    FINDINGS:  Normal external genitalia, 14 wk size mobile uterus with Normal contours.  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  The risks, benefits, and alternatives of surgery were explained, understood, and accepted. Consents were signed.  All questions were answered. She was taken to the operating room and general anesthesia was applied without complication. She was placed in the dorsal lithotomy position and her abdomen and vagina were prepped and draped after she had been carefully positioned on the table. A bimanual exam revealed a 14 week size uterus that was mobile. Her adnexa were not enlarged. A Foley catheter was placed and it drained clear throughout the case. A speculum was placed and the cervix visualized. A tenaculum was used to grasp the anterior lip of the cervix. The cervix was measured and the uterus was sounded to 8 cm. A Rumi uterine manipulator was placed without difficulty.  Gloves were changed and attention was turned to the abdomen. All incisions were infiltrated with local anesthetic. An 8mm incision was made in the LUQ and an optiview airseal trocar was introduced into the abdomen. The Entry was confirmed with low opening intraabdominal pressure and visualization and the abdomen was then insufflated. After good pneumoperitoneum was established, the abdomen was surveyed including the upper abdomen.She was placed in Trendelenburg position and ports were placed in appropriate positions on her abdomen to allow maximum exposure during the robotic case. Specifically, trocars were placed supraumbilically, in the LLQ, RLQ, and RUQ.  These were all placed under direct laparoscopic visualization after infiltration with local anesthetic. The robot was docked and I proceeded with a robotic portion of the case.  The pelvis was inspected and the above findings were noted. The omental adhesion was taken down sharply.  The ureters and the infundibulopelvic ligaments were identified. I excised the fallopian tubes bilaterally. The round ligament on each side was cauterized and cut. The round ligaments were identified, cauterized and ligated, a bladder flap was created anteriorly. To better delineate the bladder, the bladder was backfilled  with 200 cc of normal saline. The bladder was dissected off the lower uterine segment using blunt and sharp dissection and minimal use of  energy. . The bladder was then allowed to drain continuously thereafter. The uterine vessels were identified and cauterized and then cut.The bladder was pushed out of the operative site and an anterior colpotomy was made. The colpotomy incision was extended circumferentially, following the blue outline of the Rumi manipulator. All pedicles were hemostatic. At this time, attention turned to the vagina and an alexis specimen retrieval bag was inserted through the vagina, after dipping in betadine, and the specimen placed in the vagina. The opening of the bag was brought through the vagina and the uterus manually morecellated until it was removed in it's entirety. The bag was inspected and noted to be intact. The vagina was inspected and noted to be intact.  The vaginal cuff was closed with v-lock suture in a running fashion using the robotic platform.  Excellent hemostasis was noted throughout. The pelvis was irrigated. The intraabdominal pressure was lowered assess hemostasis. After determining excellent hemostasis, the robot was undocked. At this point I performed cystoscopy. The cystoscopy revealed fluorescein ejection from both ureters. Given the diverticulum and blooming of mucosa deep to the left ureteral orifice, the robotic camera was reintroduced into the abdomen and the ureter followed trans-peritoneally by the assist. 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 The cystoscopy was concluded.    The skin from all of the other ports was closed with 3-0 vicryl. 30cc of 0.5% Marcaine was injected into the port sites.  The patient was then extubated and taken to recovery in stable condition.   Sponge, lap and needle counts were correct x 2.    Lorriane Shire, MD Minimally Invasive  Gynecologic Surgery  Obstetrics and Gynecology, [facility] for [facility], [facility] 07/02/2023

## 2023-07-02 NOTE — Anesthesia Procedure Notes (Signed)
Procedure Name: Intubation Date/Time: 07/02/2023 1:11 PM  Performed by: Eulah Pont, CRNAPre-anesthesia Checklist: Patient identified, Emergency Drugs available, Suction available and Patient being monitored Patient Re-evaluated:Patient Re-evaluated prior to induction Oxygen Delivery Method: Circle System Utilized Preoxygenation: Pre-oxygenation with 100% oxygen Induction Type: IV induction Ventilation: Mask ventilation without difficulty Laryngoscope Size: Miller and 2 Grade View: Grade II Tube type: Oral Tube size: 7.0 mm Number of attempts: 1 Airway Equipment and Method: Stylet and Oral airway Placement Confirmation: ETT inserted through vocal cords under direct vision, positive ETCO2 and breath sounds checked- equal and bilateral Secured at: 21 cm Tube secured with: Tape Dental Injury: Teeth and Oropharynx as per pre-operative assessment

## 2023-07-03 ENCOUNTER — Encounter (HOSPITAL_COMMUNITY): Payer: Self-pay | Admitting: Obstetrics and Gynecology

## 2023-07-04 ENCOUNTER — Encounter: Payer: Self-pay | Admitting: Obstetrics and Gynecology

## 2023-07-04 LAB — TYPE AND SCREEN
ABO/RH(D): B POS
Antibody Screen: POSITIVE
Donor AG Type: NEGATIVE
Donor AG Type: NEGATIVE
PT AG Type: NEGATIVE
Unit division: 0
Unit division: 0

## 2023-07-04 LAB — BPAM RBC
Blood Product Expiration Date: 202502252359
Blood Product Expiration Date: 202502272359
Unit Type and Rh: 7300
Unit Type and Rh: 7300

## 2023-07-04 LAB — SURGICAL PATHOLOGY

## 2023-07-16 ENCOUNTER — Telehealth: Payer: BC Managed Care – PPO | Admitting: Obstetrics and Gynecology

## 2023-07-16 DIAGNOSIS — Z0289 Encounter for other administrative examinations: Secondary | ICD-10-CM

## 2023-07-16 DIAGNOSIS — I471 Supraventricular tachycardia, unspecified: Secondary | ICD-10-CM

## 2023-07-16 DIAGNOSIS — Z09 Encounter for follow-up examination after completed treatment for conditions other than malignant neoplasm: Secondary | ICD-10-CM

## 2023-07-16 DIAGNOSIS — I10 Essential (primary) hypertension: Secondary | ICD-10-CM

## 2023-07-16 MED ORDER — AMLODIPINE BESYLATE 5 MG PO TABS: ORAL_TABLET | ORAL | 1 refills | Status: DC

## 2023-07-16 MED ORDER — METOPROLOL SUCCINATE ER 50 MG PO TB24: 50.0000 mg | ORAL_TABLET | Freq: Every day | ORAL | 1 refills | Status: DC

## 2023-07-16 NOTE — Progress Notes (Unsigned)
 GYNECOLOGY VIRTUAL VISIT ENCOUNTER NOTE  Provider location: [facility] at {Blank single:[id number]::"[facility]","[place]","[facility]"}   Patient location: Home  I connected with Haley Gonzales on 07/16/23 at  2:15 PM EST by MyChart Video Encounter and verified that I am speaking with the correct person using two identifiers.   I discussed the limitations, risks, security and privacy concerns of performing an evaluation and management service virtually and the availability of in person appointments. I also discussed with the patient that there may be a patient responsible charge related to this service. The patient expressed understanding and agreed to proceed.   History:  Haley Gonzales is a 53 y.o. [phone] female being evaluated today for ***. She denies any abnormal vaginal discharge, bleeding, pelvic pain or other concerns.       Past Medical History:  Diagnosis Date   Anemia    Anxiety    Arthritis    right knee   Fibroids    Hyperlipidemia    Hypertension    Menorrhagia    Prediabetes    SVT (supraventricular tachycardia) (HCC)    Ulcerative colitis (HCC)    Vitamin D deficiency    Past Surgical History:  Procedure Laterality Date   CESAREAN SECTION     CYSTOSCOPY N/A 07/02/2023   Procedure: CYSTOSCOPY;  Surgeon: Lorriane Shire, MD;  Location: [facility] OR;  Service: Gynecology;  Laterality: N/A;   ROBOTIC ASSISTED LAPAROSCOPIC HYSTERECTOMY AND SALPINGECTOMY Bilateral 07/02/2023   Procedure: XI ROBOTIC ASSISTED LAPAROSCOPIC HYSTERECTOMY AND SALPINGECTOMY;  Surgeon: Lorriane Shire, MD;  Location: [facility] OR;  Service: Gynecology;  Laterality: Bilateral;   TUBAL LIGATION     The following portions of the patient's history were reviewed and updated as appropriate: allergies, current medications, past family history, past medical history, past social history, past  surgical history and problem list.   Health Maintenance:  Normal pap and negative HRHPV on ***.  Normal mammogram on ***.   Review of Systems:  Pertinent items noted in HPI and remainder of comprehensive ROS otherwise negative.  Physical Exam:   General:  Alert, oriented and cooperative. Patient appears to be in no acute distress.  Mental Status: Normal mood and affect. Normal behavior. Normal judgment and thought content.   Respiratory: Normal respiratory effort, no problems with respiration noted  Rest of physical exam deferred due to type of encounter  Labs and Imaging Results for orders placed or performed during the hospital encounter of 07/02/23 (from the past 2 weeks)  Surgical pathology   Collection Time: 07/02/23  3:03 PM  Result Value Ref Range   SURGICAL PATHOLOGY      SURGICAL PATHOLOGY CASE: [id number] PATIENT: Haley Gonzales Surgical Pathology Report     Clinical History: AUB (cm)     FINAL MICROSCOPIC DIAGNOSIS:  A. UTERUS AND CERVIX, WITH BILATERAL FALLOPIAN TUBES, HYSTERECTOMY: Cervix:           Unremarkable.           Negative for dysplasia or malignancy.       Endocervix:           Nabothian cysts.           Negative for hyperplasia, atypia or malignancy.       Endometrium:           Benign disordered proliferative endometrium.           Negative for hyperplasia, atypia or malignancy.       Myometrium:  Leiomyomata, multiple, submucosal, intramural and subserosal.           Adenomyosis.           Negative for malignancy.       Serosa:           Unremarkable.           Negative for malignancy.   GROSS DESCRIPTION:  Specimen: Received fresh labeled uterus, cervix, bilateral tubes Specimen integrity: A morcellated uterus and cervix with detached, unoriented fallopian tubes Size and shape:  Uterus and cervix measure 18.6 x 12.5 x 8.1 cm in aggregate Weight: 561 g Serosa: Intact serosa is tan-pink with mild  adhesions Cervix: The ectocervix is tan, glistening, hyperemic, and measures approximately 4.0 x 3.1 cm with a 0.8 cm cervical os.  Endocervical canal measures approximately 3.0 cm in length, and is tan-red and corrugated.  Cervical stroma is tan and fibrous. Endometrium: Specimen endometrium is tan-pink, hyperemic, with multiple submucosal nodules ranging from 0.6 to 1.0 cm in greatest dimension. Myometrium: Tan-pink and trabeculated, with multiple tan-white, whorled, bulging intramural nodules ranging from 0.6 to 5.5 cm in greatest dimension.  Focal areas of tan, slightly whorled parenchyma is identified grossly suggestive of adenomyosis. Bilateral fallopian tubes: Shorter segment of fimbriated fallopian tube measures 2.5 cm in length by 0.6 cm in diameter.  The serosal surface is tan-purple with moderate to dense adhesions.  Specimen displays a blun ted proximal end, grossly consistent with previous tubal ligation. Sectioning reveals a tan cut surface with a pinpoint lumen.  The longer fimbriated fallopian tube measures 5.5 cm in length and up to 0.9 cm in diameter.  Serosal surfaces tan-gray to purple with moderate adhesions, the specimen is disrupted with apparent blunted ends grossly suggestive of previous tubal ligation.  Sectioning the proximal segment reveals a tan-white cut surface with a moderately dilated lumen.  Lumen is filled with small amount of tan serous fluid.  Sectioning the distal segment reveals a tan cut surface with a pinpoint lumen. Block Summary: 11 blocks submitted 1 and 2 = cervix 3 = serosa 4-7 = possible endomyometrium 8 = possible adenomyosis 9 = shorter fallopian tube 10 and 11 = longer fallopian tube  [name] 07/03/2023)  Final Diagnosis performed by Lance Coon, MD.   Electronically signed 07/04/2023 Technical and / or Professional components performed at [facility]. [facility] , [address], [place], [place] [address].  Immunohistochemistry  Technical component (if applicable) was performed at [facility]. [address], [place], [place] [address].   IMMUNOHISTOCHEMISTRY DISCLAIMER (if applicable): Some of these immunohistochemical stains may have been developed and the performance characteristics determine by [facility]. Some may not have been cleared or approved by the U.S. Food and Drug Administration. The FDA has determined that such clearance or approval is not necessary. This test is used for clinical purposes. It should not be regarded as investigational or for research. This laboratory is certified under the Clinical Laboratory Improvement Amendments of 1988 (CLIA-88) as qualified to perform high complexity clinical laboratory testing.  The controls stained appropriately.   IHC stains are performed on formalin fixed, paraffin embedded tissue using a 3,3"diaminobenzidine (DAB) chromogen and Le[employer] a Bond Autostainer System. The staining intensity of the nucleus is score manually and is reported as the percentage of tumor cell nuclei demonstrating specific nuclear staining. The specimens are fixed in 10% Neutral Formalin for at least 6 hours and up to 72hrs. These tests are validated on decalcified tissue.  Results should be interpreted with caution given the possibility of false negative results on decalcified specimens. Antibody Clones are as follows ER-clone 29F, PR-clone 16, Ki67- clone MM1. Some of these immunohistochemical stains may have been developed and the performance characteristics determined by [facility].    ECHOCARDIOGRAM COMPLETE Result Date: 06/23/2023    ECHOCARDIOGRAM REPORT   Patient Name:   FARRELL BROERMAN Date of Exam: 06/23/2023 Medical Rec #:  [id number]         Height:       63.0 in Accession #:    [id number]        Weight:       172.2 lb Date of Birth:  May 14, 1971         BSA:          1.814 m Patient Age:    52 years          BP:           128/69  mmHg Patient Gender: F                 HR:           83 bpm. Exam Location:  [address] Procedure: 2D Echo, Color Doppler, Cardiac Doppler, 3D Echo and Strain Analysis Indications:    Z01.818 Encounter for other preprocedural examination  History:        Patient has prior history of Echocardiogram examinations. Risk                 Factors:Hypertension and Dyslipidemia.  Sonographer:    Thurman Coyer RDCS Referring Phys: [id number] Charlies Constable [facility] IMPRESSIONS  1. Left ventricular ejection fraction, by estimation, is 65 to 70%. The left ventricle has normal function. The left ventricle has no regional wall motion abnormalities. Left ventricular diastolic parameters were normal. The average left ventricular global longitudinal strain is -22.0 %. The global longitudinal strain is normal.  2. Right ventricular systolic function is normal. The right ventricular size is normal.  3. The mitral valve is normal in structure. Mild mitral valve regurgitation.  4. The aortic valve is tricuspid. Aortic valve regurgitation is not visualized.  5. The inferior vena cava is normal in size with greater than 50% respiratory variability, suggesting right atrial pressure of 3 mmHg. FINDINGS  Left Ventricle: Left ventricular ejection fraction, by estimation, is 65 to 70%. The left ventricle has normal function. The left ventricle has no regional wall motion abnormalities. The average left ventricular global longitudinal strain is -22.0 %. The global longitudinal strain is normal. The left ventricular internal cavity size was normal in size. There is no left ventricular hypertrophy. Left ventricular diastolic parameters were normal. Right Ventricle: The right ventricular size is normal. Right vetricular wall thickness was not assessed. Right ventricular systolic function is normal. Left Atrium: Left atrial size was normal in size. Right Atrium: Right atrial size was normal in size. Pericardium: There is no evidence of pericardial  effusion. Mitral Valve: The mitral valve is normal in structure. Mild mitral valve regurgitation. Tricuspid Valve: The tricuspid valve is normal in structure. Tricuspid valve regurgitation is trivial. Aortic Valve: The aortic valve is tricuspid. Aortic valve regurgitation is not visualized. Pulmonic Valve: The pulmonic valve was normal in structure. Pulmonic valve regurgitation is trivial. Aorta: The aortic root and ascending aorta are structurally normal, with no evidence of dilitation. Venous: The inferior vena cava is normal in size with greater than 50% respiratory variability, suggesting right atrial pressure of 3 mmHg. IAS/Shunts: No atrial  level shunt detected by color flow Doppler.  LEFT VENTRICLE PLAX 2D LVIDd:         4.40 cm   Diastology LVIDs:         2.50 cm   LV e' medial:    8.81 cm/s LV PW:         1.10 cm   LV E/e' medial:  13.5 LV IVS:        1.00 cm   LV e' lateral:   11.70 cm/s LVOT diam:     1.90 cm   LV E/e' lateral: 10.2 LV SV:         73 LV SV Index:   40        2D Longitudinal Strain LVOT Area:     2.84 cm  2D Strain GLS Avg:     -22.0 %                           3D Volume EF:                          3D EF:        61 %                          LV EDV:       118 ml                          LV ESV:       46 ml                          LV SV:        73 ml RIGHT VENTRICLE             IVC RV Basal diam:  3.40 cm     IVC diam: 1.60 cm RV Mid diam:    2.90 cm RV S prime:     13.20 cm/s TAPSE (M-mode): 2.1 cm LEFT ATRIUM             Index        RIGHT ATRIUM           Index LA diam:        3.10 cm 1.71 cm/m   RA Area:     11.90 cm LA Vol (A2C):   56.7 ml 31.25 ml/m  RA Volume:   24.00 ml  13.23 ml/m LA Vol (A4C):   52.0 ml 28.66 ml/m LA Biplane Vol: 58.0 ml 31.97 ml/m  AORTIC VALVE LVOT Vmax:   113.00 cm/s LVOT Vmean:  80.200 cm/s LVOT VTI:    0.258 m  AORTA Ao Root diam: 2.70 cm Ao Asc diam:  3.20 cm MITRAL VALVE MV Area (PHT): 4.21 cm     SHUNTS MV Decel Time: 180 msec     Systemic VTI:   0.26 m MV E velocity: 119.00 cm/s  Systemic Diam: 1.90 cm MV A velocity: 82.80 cm/s MV E/A ratio:  1.44 Dietrich Pates MD Electronically signed by Dietrich Pates MD Signature Date/Time: 06/23/2023/3:28:03 PM    Final        Assessment and Plan:     There are no diagnoses linked to this encounter.      I discussed the assessment and treatment plan with the patient. The patient was provided an  opportunity to ask questions and all were answered. The patient agreed with the plan and demonstrated an understanding of the instructions.   The patient was advised to call back or seek an in-person evaluation/go to the ED if the symptoms worsen or if the condition fails to improve as anticipated.  I provided *** minutes of face-to-face time during this encounter. I also spent *** minutes dedicated to the care of this patient including pre-visit review of records, post visit ordering of medications and appropriate tests or procedures, coordinating care and documenting this visit encounter.    Lorriane Shire, MD [facility] [employer], [facility]

## 2023-08-11 ENCOUNTER — Other Ambulatory Visit: Payer: Self-pay

## 2023-08-11 ENCOUNTER — Encounter: Payer: Self-pay | Admitting: Obstetrics and Gynecology

## 2023-08-11 ENCOUNTER — Ambulatory Visit (INDEPENDENT_AMBULATORY_CARE_PROVIDER_SITE_OTHER): Payer: BC Managed Care – PPO | Admitting: Obstetrics and Gynecology

## 2023-08-11 VITALS — BP 178/95 | HR 65 | Wt 174.3 lb

## 2023-08-11 DIAGNOSIS — N939 Abnormal uterine and vaginal bleeding, unspecified: Secondary | ICD-10-CM

## 2023-08-11 DIAGNOSIS — Z09 Encounter for follow-up examination after completed treatment for conditions other than malignant neoplasm: Secondary | ICD-10-CM

## 2023-08-11 DIAGNOSIS — I10 Essential (primary) hypertension: Secondary | ICD-10-CM

## 2023-08-11 NOTE — Progress Notes (Signed)
   POSTOPERATIVE VISIT NOTE   Subjective:     Haley Gonzales is a 53 y.o. [phone] who presents to the clinic 6 weeks status post  RA-TLH, BS, cysto  for abnormal uterine bleeding. Eating a regular diet without difficulty. Bowel movements are normal. The patient is not having any pain. Incision: doing well Vaginal bleeding: none Resumed sexual acitivity: no   Last took medication this morning, notes eating neck bones with added salt (table salt and season salt) last night and today   The following portions of the patient's history were reviewed and updated as appropriate: allergies, current medications, past family history, past medical history, past social history, past surgical history, and problem list..   Review of Systems Pertinent items are noted in HPI.    Objective:    BP (!) 178/95   Pulse 65   Wt 174 lb 4.8 oz (79.1 kg)   LMP 05/12/2023 (Approximate)   BMI 30.88 kg/m  General:  alert, cooperative, and no distress  Pelvic:   Vaginal: normal mucosa without prolapse or lesions Intact vaginal cuff on visual and digital palpation    Pathology Results: FINAL MICROSCOPIC DIAGNOSIS:   A. UTERUS AND CERVIX, WITH BILATERAL FALLOPIAN TUBES, HYSTERECTOMY:  Cervix:           Unremarkable.            Negative for dysplasia or malignancy.        Endocervix:            Nabothian cysts.            Negative for hyperplasia, atypia or malignancy.        Endometrium:            Benign disordered proliferative endometrium.            Negative for hyperplasia, atypia or malignancy.        Myometrium:            Leiomyomata, multiple, submucosal, intramural and subserosal.            Adenomyosis.            Negative for malignancy.        Serosa:            Unremarkable.            Negative for malignancy    Assessment:   Doing well postoperatively. Operative findings again reviewed. Pathology report discussed.   Plan:    1. Postop check (Primary) Doing well  2.  Abnormal uterine bleeding (AUB) Resolved s/p RA-TLH, BS, cysot  3. Essential hypertension Continue medication, briefly discussed decreasing salt intake for better BP control Follow up with PCP/cards   Activity restrictions: none Anticipated return to work: now. Follow up: as needed  Lorriane Shire, MD Obstetrician & Gynecologist, [facility] for [employer], [facility]

## 2023-08-27 ENCOUNTER — Ambulatory Visit: Payer: BC Managed Care – PPO | Admitting: Obstetrics and Gynecology

## 2023-11-14 ENCOUNTER — Ambulatory Visit (HOSPITAL_COMMUNITY)
Admission: EM | Admit: 2023-11-14 | Discharge: 2023-11-14 | Disposition: A | Discharge status: Home / Self Care | Attending: Internal Medicine | Admitting: Internal Medicine

## 2023-11-14 ENCOUNTER — Encounter (HOSPITAL_COMMUNITY): Payer: Self-pay

## 2023-11-14 DIAGNOSIS — I10 Essential (primary) hypertension: Secondary | ICD-10-CM

## 2023-11-14 DIAGNOSIS — I471 Supraventricular tachycardia, unspecified: Secondary | ICD-10-CM

## 2023-11-14 MED ORDER — METOPROLOL SUCCINATE ER 50 MG PO TB24: 50.0000 mg | ORAL_TABLET | Freq: Every day | ORAL | 3 refills | Status: AC

## 2023-11-14 MED ORDER — AMLODIPINE BESYLATE 10 MG PO TABS: 10.0000 mg | ORAL_TABLET | Freq: Every day | ORAL | 3 refills | Status: AC

## 2023-11-14 NOTE — Discharge Instructions (Signed)
 Hypertension with mild headache but no neurological symptoms that are concerning at this time. Will send in refills for the amlodipine  10 mg daily as this was the previous dose. Will refill Metoprolol  50 mg daily as well. Recommend monitoring your symptoms and if there is any worsening, then recommend going to the emergency room. Call your PCP to schedule an appointment to discuss blood pressure. Reduce salt intake and drink plenty of water. May return to urgent care as needed.

## 2023-11-14 NOTE — ED Provider Notes (Signed)
 [facility]    CSN: [id number] Arrival date & time: 11/14/23  1420      History   Chief Complaint Chief Complaint  Patient presents with   Hypertension    HPI Haley Gonzales is a 53 y.o. female.   53 year old female who presents to urgent care with hypertension and out of her medications.  She reports that she has been out of her amlodipine  for several days now.  She also relates that the last time she had a refill of the amlodipine  it was a lower dose.  She normally takes 10 mg and the refill was for 5 mg.  She also takes metoprolol  but she still has a few of these left.  She has noted a mild headache for the last couple of days.  She also relates that she has been unable to wear her glasses as she does not like them and this has resulted in her having to strain a lot to see.  She denies any slurred speech, vision loss, unilateral symptoms, facial paralysis, weakness, or other constitutional symptoms.  She does have a primary care physician but she has not seen them in quite some time.  She denies any shortness of breath or chest pain.   Hypertension Associated symptoms include headaches. Pertinent negatives include no chest pain, no abdominal pain and no shortness of breath.    Past Medical History:  Diagnosis Date   Anemia    Anxiety    Arthritis    right knee   Fibroids    Hyperlipidemia    Hypertension    Menorrhagia    Prediabetes    SVT (supraventricular tachycardia) (HCC)    Ulcerative colitis (HCC)    Vitamin D deficiency     Patient Active Problem List   Diagnosis Date Noted   Abnormal uterine bleeding 07/02/2023   Abnormal uterine bleeding (AUB) 05/15/2023   Essential hypertension 08/18/2022   IDA (iron  deficiency anemia) 02/13/2021    Past Surgical History:  Procedure Laterality Date   CESAREAN SECTION     CYSTOSCOPY N/A 07/02/2023   Procedure: CYSTOSCOPY;  Surgeon: Kiki Pelton, MD;  Location: [facility];  Service: Gynecology;   Laterality: N/A;   ROBOTIC ASSISTED LAPAROSCOPIC HYSTERECTOMY AND SALPINGECTOMY Bilateral 07/02/2023   Procedure: XI ROBOTIC ASSISTED LAPAROSCOPIC HYSTERECTOMY AND SALPINGECTOMY;  Surgeon: Kiki Pelton, MD;  Location: [facility];  Service: Gynecology;  Laterality: Bilateral;   TUBAL LIGATION      OB History     Gravida  4   Para  4   Term  2   Preterm  2   AB      Living  3      SAB      IAB      Ectopic      Multiple      Live Births  4            Home Medications    Prior to Admission medications   Medication Sig Start Date End Date Taking? Authorizing Provider  amLODipine  (NORVASC ) 10 MG tablet Take 1 tablet (10 mg total) by mouth daily. 11/14/23  Yes Michaela Shankel A, PA-C  Blood Pressure Monitor KIT Dispense based on patient and insurance preference. To check blood pressure as needed. 04/30/22  Yes Afton Albright, MD  ibuprofen  (ADVIL ) 800 MG tablet Take 1 tablet (800 mg total) by mouth 3 (three) times daily with meals as needed for headache, moderate pain (pain score 4-6) or cramping. 07/02/23  Yes Ajewole,  Christana, MD  acetaminophen  (TYLENOL ) 500 MG tablet Take 2 tablets (1,000 mg total) by mouth every 6 (six) hours as needed. Patient not taking: Reported on 08/11/2023 07/02/23   Ajewole, Christana, MD  iron  polysaccharides (NIFEREX) 150 MG capsule Take 1 capsule (150 mg total) by mouth daily. 12/08/22 06/24/23  [place] Bureau, PA-C  metoprolol  succinate (TOPROL -XL) 50 MG 24 hr tablet Take 1 tablet (50 mg total) by mouth daily. 11/14/23   Bruk Tumolo A, PA-C  oxyCODONE  (ROXICODONE ) 5 MG immediate release tablet Take 1 tablet (5 mg total) by mouth every 4 (four) hours as needed for severe pain (pain score 7-10). Patient not taking: Reported on 08/11/2023 07/02/23   Ajewole, Christana, MD  polyethylene glycol powder (GAVILAX) 17 GM/SCOOP powder Mix as direcred and take 17 g by mouth daily. Patient not taking: Reported on 08/11/2023 07/02/23   Kiki Pelton, MD    Family History Family History  Problem Relation Age of Onset   Hypertension Mother    Lung cancer Father     Social History Social History   Tobacco Use   Smoking status: Never   Smokeless tobacco: Never  Vaping Use   Vaping status: Never Used  Substance Use Topics   Alcohol use: Not Currently   Drug use: No     Allergies   Patient has no known allergies.   Review of Systems Review of Systems  Constitutional:  Negative for chills and fever.  HENT:  Negative for ear pain and sore throat.   Eyes:  Negative for pain and visual disturbance.  Respiratory:  Negative for cough and shortness of breath.   Cardiovascular:  Negative for chest pain and palpitations.  Gastrointestinal:  Negative for abdominal pain and vomiting.  Genitourinary:  Negative for dysuria and hematuria.  Musculoskeletal:  Negative for arthralgias and back pain.  Skin:  Negative for color change and rash.  Neurological:  Positive for headaches. Negative for seizures and syncope.  All other systems reviewed and are negative.    Physical Exam Triage Vital Signs ED Triage Vitals  Encounter Vitals Group     BP 11/14/23 1506 (!) 159/99     Girls Systolic BP Percentile --      Girls Diastolic BP Percentile --      Boys Systolic BP Percentile --      Boys Diastolic BP Percentile --      Pulse Rate 11/14/23 1506 81     Resp 11/14/23 1506 17     Temp 11/14/23 1506 98.1 F (36.7 C)     Temp Source 11/14/23 1506 Oral     SpO2 11/14/23 1506 98 %     Weight 11/14/23 1504 173 lb (78.5 kg)     Height 11/14/23 1504 5' 2 (1.575 m)     Head Circumference --      Peak Flow --      Pain Score 11/14/23 1504 0     Pain Loc --      Pain Education --      Exclude from Growth Chart --    No data found.  Updated Vital Signs BP (!) 159/99 (BP Location: Left Arm)   Pulse 81   Temp 98.1 F (36.7 C) (Oral)   Resp 17   Ht 5' 2 (1.575 m)   Wt 173 lb (78.5 kg)   LMP 05/12/2023 (Approximate)    SpO2 98%   BMI 31.64 kg/m   Visual Acuity Right Eye Distance:   Left Eye Distance:  Bilateral Distance:    Right Eye Near:   Left Eye Near:    Bilateral Near:     Physical Exam Vitals and nursing note reviewed.  Constitutional:      General: She is not in acute distress.    Appearance: She is well-developed.  HENT:     Head: Normocephalic and atraumatic.   Eyes:     Extraocular Movements: Extraocular movements intact.     Conjunctiva/sclera: Conjunctivae normal.     Pupils: Pupils are equal, round, and reactive to light.    Cardiovascular:     Rate and Rhythm: Normal rate and regular rhythm.     Heart sounds: No murmur heard. Pulmonary:     Effort: Pulmonary effort is normal. No respiratory distress.     Breath sounds: Normal breath sounds.  Abdominal:     Palpations: Abdomen is soft.     Tenderness: There is no abdominal tenderness.   Musculoskeletal:        General: No swelling.     Cervical back: Neck supple.   Skin:    General: Skin is warm and dry.     Capillary Refill: Capillary refill takes less than 2 seconds.   Neurological:     General: No focal deficit present.     Mental Status: She is alert.     Cranial Nerves: Cranial nerves 2-12 are intact.     Sensory: Sensation is intact.     Motor: Motor function is intact.     Coordination: Coordination is intact.     Gait: Gait is intact.   Psychiatric:        Mood and Affect: Mood normal.      UC Treatments / Results  Labs (all labs ordered are listed, but only abnormal results are displayed) Labs Reviewed - No data to display  EKG   Radiology No results found.  Procedures Procedures (including critical care time)  Medications Ordered in UC Medications - No data to display  Initial Impression / Assessment and Plan / UC Course  I have reviewed the triage vital signs and the nursing notes.  Pertinent labs & imaging results that were available during my care of the patient were  reviewed by me and considered in my medical decision making (see chart for details).     Essential hypertension - Plan: metoprolol  succinate (TOPROL -XL) 50 MG 24 hr tablet  SVT (supraventricular tachycardia) (HCC) - Plan: metoprolol  succinate (TOPROL -XL) 50 MG 24 hr tablet   Patient has hypertension with her blood pressures being close to what they have been in previous visits.  She does have a mild headache but no other new concerning neurological symptoms.  Her neurological exam is reassuring on physical exam findings.  We will call in a refill for amlodipine  10 mg daily and will also call in refills for her metoprolol  50 mg daily.  We recommend that she monitor her symptoms and her blood pressure at home. If there is any worsening then recommend going to the emergency room for further evaluation.  Recommend that the patient schedule a follow-up appointment with her primary care provider to discuss her blood pressure.  Final Clinical Impressions(s) / UC Diagnoses   Final diagnoses:  Essential hypertension     Discharge Instructions      Hypertension with mild headache but no neurological symptoms that are concerning at this time. Will send in refills for the amlodipine  10 mg daily as this was the previous dose. Will refill Metoprolol  50 mg daily  as well. Recommend monitoring your symptoms and if there is any worsening, then recommend going to the emergency room. Call your PCP to schedule an appointment to discuss blood pressure. Reduce salt intake and drink plenty of water. May return to urgent care as needed.     ED Prescriptions     Medication Sig Dispense Auth. Provider   metoprolol  succinate (TOPROL -XL) 50 MG 24 hr tablet Take 1 tablet (50 mg total) by mouth daily. 30 tablet Alechia Lezama A, PA-C   amLODipine  (NORVASC ) 10 MG tablet Take 1 tablet (10 mg total) by mouth daily. 30 tablet Kreg Pesa, [place]      PDMP not reviewed this encounter.   Kreg Pesa,  [place] 11/14/23 1555

## 2023-11-14 NOTE — ED Triage Notes (Signed)
 Pt states that her blood pressure has been running high x2-3 days Pt states that she is currently out of her blood pressure medication.

## 2023-11-15 ENCOUNTER — Other Ambulatory Visit: Payer: Self-pay | Admitting: Obstetrics and Gynecology

## 2023-11-15 DIAGNOSIS — I10 Essential (primary) hypertension: Secondary | ICD-10-CM

## 2024-01-28 ENCOUNTER — Ambulatory Visit: Admitting: Family

## 2024-01-29 ENCOUNTER — Telehealth: Payer: Self-pay

## 2024-01-29 ENCOUNTER — Encounter: Admitting: Family

## 2024-01-29 NOTE — Progress Notes (Signed)
 Erroneous encounter-disregard

## 2024-01-29 NOTE — Telephone Encounter (Signed)
 Called pt to reschedule missed NP appt at [facility]; could not reach or leave vm

## 2024-07-01 NOTE — Progress Notes (Unsigned)
 "  Cardiology Office Note:   Date:  07/01/2024  ID:  Haley Gonzales, DOB 1970/09/24, MRN [id number] PCP:  Haley Gonzales  [facility] Providers Cardiologist:  Haley Haws, MD Referring MD: Gonzales ref. provider found  Chief Complaint/Reason for Referral: Follow-up SVT, hypertension ASSESSMENT:    1. SVT (supraventricular tachycardia)   2. Essential hypertension   3. BMI 30.0-30.9,adult     PLAN:   In order of problems listed above: SVT: Continue Toprol  50 mg daily Hypertension: Continue amlodipine  10 mg, Toprol  50 mg daily Prediabetes:   Hemoglobin A1c 1 year ago was 5.9.  Continue risk factor control Elevated BMI: Diet and exercise modification.        {Are you ordering a CV Procedure (e.g. stress test, cath, DCCV, TEE, etc)?   Press F2        :[id number]}   Dispo:  Gonzales follow-ups on file.       I spent *** minutes reviewing all clinical data during and prior to this visit including all relevant imaging studies, laboratories, clinical information from other health systems and prior notes from both Cardiology and other specialties, interviewing the patient, conducting a complete physical examination, and coordinating care in order to formulate a comprehensive and personalized evaluation and treatment plan.   History of Present Illness:    FOCUSED PROBLEM LIST:   SVT Diagnosed 2018 >> txt'd w/ adenosine  On Toprol  Normal diastolic function, Gonzales significant valve issues, EF 65 to 70% TTE January 2025 Hypertension Prediabetes BMI 30 Iron  deficiency anemia Fibroids Low risk stress test ETT 2019  12/24:  The patient is a 54 year old female with above listed medical problems referred by Dr. Jeralyn for preoperative cardiovascular evaluation prior to laparoscopic hysterectomy with salpingectomy and cystoscopy.  The patient was last seen by cardiology in 2020.  At that time she was doing well.  She was seen by her OB/GYN due to increased uterine bleeding and was referred for previously  listed surgery.  The patient is a non-smoker.  She works at [employer].  She is on her feet all day carrying groceries.  She denies any chest pain, shortness of breath, presyncope, or syncope.  She is able to do 4 METS of activity without any issues.  She has had Gonzales recurrence of her SVT while she has been taking Toprol .  She does not check her blood pressures on a regular basis at home.  Plan: Obtain echocardiogram.  Follow-up in 1 year  February 2026:  Patient consents to use of AI scribe. The patient returns for routine follow-up.  She underwent her hysterectomy without any issues.  She was seen in the emergency department in June 2025.  Her blood pressure was elevated.  She was prescribed metoprolol  and discharged home      Current Medications: Active Medications[1]   Review of Systems:   Please see the history of present illness.    All other systems reviewed and are negative.     EKGs/Labs/Other Test Reviewed:   EKG: December 2024 normal sinus rhythm, inferior infarction pattern  EKG Interpretation Date/Time:    Ventricular Rate:    PR Interval:    QRS Duration:    QT Interval:    QTC Calculation:   R Axis:      Text Interpretation:          CARDIAC STUDIES: Refer to CV Procedures and Imaging Tabs   Risk Assessment/Calculations:   {Does this patient have ATRIAL FIBRILLATION?[phone]}      Physical Exam:  VS:  LMP 05/12/2023    Gonzales BP recorded.  {Refresh Note OR Click here to enter BP  :1}***   Wt Readings from Last 3 Encounters:  11/14/23 173 lb (78.5 kg)  08/11/23 174 lb 4.8 oz (79.1 kg)  07/02/23 171 lb (77.6 kg)      GENERAL:  Gonzales apparent distress, AOx3 HEENT:  Gonzales carotid bruits, +2 carotid impulses, Gonzales scleral icterus CAR: RRR Irregular RR*** Gonzales murmurs***, gallops, rubs, or thrills RES:  Clear to auscultation bilaterally ABD:  Soft, nontender, nondistended, positive bowel sounds x 4 VASC:  +2 radial pulses, +2 carotid pulses NEURO:  CN 2-12 grossly  intact; motor and sensory grossly intact PSYCH:  Gonzales active depression or anxiety EXT:  Gonzales edema, ecchymosis, or cyanosis  Signed, Haley Croll Gonzales Geneve Kimpel, MD  07/01/2024 4:48 PM    [facility] [address], [place], [place]  [address] Phone: [phone]; Fax: [phone]   Note:  This document was prepared using Dragon voice recognition software and may include unintentional dictation errors.    [1]  Gonzales outpatient medications have been marked as taking for the 07/07/24 encounter (Appointment) with Haley Ashraf K, MD.   "

## 2024-07-07 ENCOUNTER — Ambulatory Visit: Admitting: Internal Medicine

## 2024-07-07 DIAGNOSIS — I10 Essential (primary) hypertension: Secondary | ICD-10-CM

## 2024-07-07 DIAGNOSIS — I471 Supraventricular tachycardia, unspecified: Secondary | ICD-10-CM

## 2024-07-07 DIAGNOSIS — Z6830 Body mass index (BMI) 30.0-30.9, adult: Secondary | ICD-10-CM
